# Patient Record
Sex: Female | Born: 1995 | Race: Black or African American | Hispanic: No | Marital: Single | Smoking: Never smoker
Health system: Southern US, Community
[De-identification: ages and names within clinical notes are randomized; demographics above are authoritative.]

## PROBLEM LIST (undated history)

## (undated) DIAGNOSIS — F419 Anxiety disorder, unspecified: Secondary | ICD-10-CM

## (undated) DIAGNOSIS — K219 Gastro-esophageal reflux disease without esophagitis: Secondary | ICD-10-CM

## (undated) DIAGNOSIS — Z789 Other specified health status: Secondary | ICD-10-CM

## (undated) DIAGNOSIS — N390 Urinary tract infection, site not specified: Secondary | ICD-10-CM

## (undated) DIAGNOSIS — D649 Anemia, unspecified: Secondary | ICD-10-CM

## (undated) DIAGNOSIS — Z5189 Encounter for other specified aftercare: Secondary | ICD-10-CM

## (undated) HISTORY — DX: Anemia, unspecified: D64.9

## (undated) HISTORY — DX: Encounter for other specified aftercare: Z51.89

## (undated) HISTORY — DX: Other specified health status: Z78.9

## (undated) HISTORY — PX: NO PAST SURGERIES: SHX2092

## (undated) HISTORY — PX: WISDOM TOOTH EXTRACTION: SHX21

## (undated) HISTORY — DX: Urinary tract infection, site not specified: N39.0

---
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
## 2013-12-31 ENCOUNTER — Emergency Department (INDEPENDENT_AMBULATORY_CARE_PROVIDER_SITE_OTHER)
Admission: EM | Admit: 2013-12-31 | Discharge: 2013-12-31 | Disposition: A | Discharge status: Home / Self Care | Payer: Medicaid Other | Source: Home / Self Care | Attending: Family Medicine | Admitting: Family Medicine

## 2013-12-31 ENCOUNTER — Encounter (HOSPITAL_COMMUNITY): Payer: Self-pay | Admitting: Emergency Medicine

## 2013-12-31 DIAGNOSIS — K089 Disorder of teeth and supporting structures, unspecified: Secondary | ICD-10-CM

## 2013-12-31 DIAGNOSIS — K0889 Other specified disorders of teeth and supporting structures: Secondary | ICD-10-CM

## 2013-12-31 MED ORDER — CLINDAMYCIN HCL 150 MG PO CAPS: 150.0000 mg | ORAL_CAPSULE | Freq: Four times a day (QID) | ORAL | Status: DC

## 2013-12-31 MED ORDER — DICLOFENAC POTASSIUM 50 MG PO TABS: 50.0000 mg | ORAL_TABLET | Freq: Three times a day (TID) | ORAL | Status: DC

## 2013-12-31 NOTE — Discharge Instructions (Signed)
Take medicine as prescribed, see your dentist as soon as possible °

## 2013-12-31 NOTE — ED Provider Notes (Signed)
CSN: [id number]     Arrival date & time 12/31/13  1611 History   First MD Initiated Contact with Patient 12/31/13 1710     No chief complaint on file.  (Consider location/radiation/quality/duration/timing/severity/associated sxs/prior Treatment) Patient is a 18 y.o. female presenting with tooth pain.  Dental Pain Location:  Lower Lower teeth location:  18/LL 2nd molar Quality:  Throbbing Severity:  Moderate Onset quality:  Gradual Duration:  3 days Progression:  Worsening Chronicity:  Recurrent Context: dental caries   Context comment:  [facility], given amox, pain relieved, stopped abx and did not go to dentist. Risk factors: lack of dental care     History reviewed. No pertinent past medical history. History reviewed. No pertinent past surgical history. No family history on file. History  Substance Use Topics  . Smoking status: Never Smoker   . Smokeless tobacco: Not on file  . Alcohol Use: No   OB History   Grav Para Term Preterm Abortions TAB SAB Ect Mult Living                 Review of Systems  Constitutional: Negative.   HENT: Positive for dental problem.     Allergies  Review of patient's allergies indicates no known allergies.  Home Medications   Current Outpatient Rx  Name  Route  Sig  Dispense  Refill  . clindamycin (CLEOCIN) 150 MG capsule   Oral   Take 1 capsule (150 mg total) by mouth 4 (four) times daily.   28 capsule   0   . diclofenac (CATAFLAM) 50 MG tablet   Oral   Take 1 tablet (50 mg total) by mouth 3 (three) times daily. For dental pain   15 tablet   0    BP 123/78  Pulse 65  Temp(Src) 99.3 F (37.4 C) (Oral)  Resp 16  SpO2 98%  LMP 12/24/2013 Physical Exam  Nursing note and vitals reviewed. Constitutional: She is oriented to person, place, and time. She appears well-developed and well-nourished. No distress.  HENT:  Mouth/Throat: Uvula is midline and oropharynx is clear and moist. Abnormal dentition. Dental  caries present.    Neck: Normal range of motion. Neck supple.  Lymphadenopathy:    She has cervical adenopathy.  Neurological: She is alert and oriented to person, place, and time.  Skin: Skin is warm and dry.    ED Course  Procedures (including critical care time) Labs Review Labs Reviewed - No data to display Imaging Review No results found.  EKG Interpretation    Date/Time:    Ventricular Rate:    PR Interval:    QRS Duration:   QT Interval:    QTC Calculation:   R Axis:     Text Interpretation:              MDM      Linna HoffJames D Rivkah Wolz, MD 12/31/13 [phone]

## 2013-12-31 NOTE — ED Notes (Signed)
Pt c/o dental pain onset 10/2013 --- reports [facility] and was given Amox that she did not finish Pain is constant; left bottom; occasional swelling She is alert w/no signs of acute distress.

## 2014-06-21 ENCOUNTER — Encounter (HOSPITAL_COMMUNITY): Payer: Self-pay | Admitting: Emergency Medicine

## 2014-06-21 ENCOUNTER — Emergency Department (INDEPENDENT_AMBULATORY_CARE_PROVIDER_SITE_OTHER)
Admission: EM | Admit: 2014-06-21 | Discharge: 2014-06-21 | Disposition: A | Discharge status: Home / Self Care | Payer: Medicaid Other | Source: Home / Self Care | Attending: Family Medicine | Admitting: Family Medicine

## 2014-06-21 DIAGNOSIS — M62838 Other muscle spasm: Secondary | ICD-10-CM | POA: Diagnosis not present

## 2014-06-21 DIAGNOSIS — F430 Acute stress reaction: Secondary | ICD-10-CM

## 2014-06-21 DIAGNOSIS — F438 Other reactions to severe stress: Secondary | ICD-10-CM

## 2014-06-21 DIAGNOSIS — F4389 Other reactions to severe stress: Secondary | ICD-10-CM | POA: Diagnosis not present

## 2014-06-21 MED ORDER — CYCLOBENZAPRINE HCL 10 MG PO TABS: 10.0000 mg | ORAL_TABLET | Freq: Two times a day (BID) | ORAL | Status: DC | PRN

## 2014-06-21 NOTE — ED Provider Notes (Signed)
CSN: [id number]     Arrival date & time 06/21/14  1526 History   First MD Initiated Contact with Patient 06/21/14 1649     Chief Complaint  Patient presents with  . Chest Pain   (Consider location/radiation/quality/duration/timing/severity/associated sxs/prior Treatment) HPI Comments: Jennifer Lynn presents today with a 3 day history of "neck spasms into her head" and periodic chest pain along the sternal region. She also reports high stress and anxiety with work and home. Would not elaborate, but denies harm to herself or others, and reports feels safe. Pain is sporadic, at times wakes her in the night. No palpitations, no SOB. See remainder of ROS.   The history is provided by the patient.    History reviewed. No pertinent past medical history. History reviewed. No pertinent past surgical history. No family history on file. History  Substance Use Topics  . Smoking status: Never Smoker   . Smokeless tobacco: Not on file  . Alcohol Use: No   OB History   Grav Para Term Preterm Abortions TAB SAB Ect Mult Living                 Review of Systems  All other systems reviewed and are negative.   Allergies  Review of patient's allergies indicates no known allergies.  Home Medications   Prior to Admission medications   Medication Sig Start Date End Date Taking? Authorizing Provider  aspirin-acetaminophen-caffeine (EXCEDRIN MIGRAINE) [phone] MG per tablet Take by mouth every 6 (six) hours as needed for headache.   Yes Historical Provider, MD  clindamycin (CLEOCIN) 150 MG capsule Take 1 capsule (150 mg total) by mouth 4 (four) times daily. 12/31/13   Linna HoffJames D Kindl, MD  cyclobenzaprine (FLEXERIL) 10 MG tablet Take 1 tablet (10 mg total) by mouth 2 (two) times daily as needed for muscle spasms. 06/21/14   Riki SheerMichelle G Ekaterini Capitano, PA-C  diclofenac (CATAFLAM) 50 MG tablet Take 1 tablet (50 mg total) by mouth 3 (three) times daily. For dental pain 12/31/13   Linna HoffJames D Kindl, MD   BP 146/94  Pulse 60   Temp(Src) 98.5 F (36.9 C) (Oral)  Resp 12  SpO2 100%  LMP 06/21/2014 Physical Exam  Nursing note and vitals reviewed. Constitutional: She is oriented to person, place, and time. She appears well-developed and well-nourished.  Tearful on exam  Eyes: Conjunctivae are normal. Pupils are equal, round, and reactive to light.  Neck: Normal range of motion.  Cardiovascular: Normal rate and regular rhythm.  Exam reveals no friction rub.   No murmur heard. Pulmonary/Chest: Effort normal and breath sounds normal. No respiratory distress. She has no wheezes. She has no rales. She exhibits no tenderness.  Musculoskeletal: Normal range of motion. She exhibits tenderness. She exhibits no edema.  Multiple tender points at left chest, generalized neck and upper scapular region  Lymphadenopathy:    She has no cervical adenopathy.  Neurological: She is alert and oriented to person, place, and time. She displays normal reflexes. She exhibits normal muscle tone.  Skin: Skin is warm and dry. She is not diaphoretic.  Psychiatric:  Behavior sad and tearful    ED Course  EKG  Date/Time: 06/21/2014 6:10 PM Performed by: Taiten Brawn G Authorized by: Riki SheerYOUNG, Adair Lemar G Interpreted by ED physician Comparison: not compared with previous ECG  Previous ECG: no previous ECG available Rhythm: sinus rhythm Rate: normal   (including critical care time) Labs Review Labs Reviewed - No data to display  Imaging Review No results found.  MDM   1. Muscle spasm   2. Stress disorder, acute    Most likely related to stress/anxiety in the setting of a possible muscle spasms. Normal EKG, and exam. Stressed importance of good health and stress management. Establish with PCP and f/u if worsens.     Riki Sheer, PA-C 06/21/14 [phone]

## 2014-06-21 NOTE — Discharge Instructions (Signed)
Muscle Cramps and Spasms Muscle cramps and spasms occur when a muscle or muscles tighten and you have no control over this tightening (involuntary muscle contraction). They are a common problem and can develop in any muscle. The most common place is in the calf muscles of the leg. Both muscle cramps and muscle spasms are involuntary muscle contractions, but they also have differences:   Muscle cramps are sporadic and painful. They may last a few seconds to a quarter of an hour. Muscle cramps are often more forceful and last longer than muscle spasms.  Muscle spasms may or may not be painful. They may also last just a few seconds or much longer. CAUSES  It is uncommon for cramps or spasms to be due to a serious underlying problem. In many cases, the cause of cramps or spasms is unknown. Some common causes are:   Overexertion.   Overuse from repetitive motions (doing the same thing over and over).   Remaining in a certain position for a long period of time.   Improper preparation, form, or technique while performing a sport or activity.   Dehydration.   Injury.   Side effects of some medicines.   Abnormally low levels of the salts and ions in your blood (electrolytes), especially potassium and calcium. This could happen if you are taking water pills (diuretics) or you are pregnant.  Some underlying medical problems can make it more likely to develop cramps or spasms. These include, but are not limited to:   Diabetes.   Parkinson disease.   Hormone disorders, such as thyroid problems.   Alcohol abuse.   Diseases specific to muscles, joints, and bones.   Blood vessel disease where not enough blood is getting to the muscles.  HOME CARE INSTRUCTIONS   Stay well hydrated. Drink enough water and fluids to keep your urine clear or pale yellow.  It may be helpful to massage, stretch, and relax the affected muscle.  For tight or tense muscles, use a warm towel, heating  pad, or hot shower water directed to the affected area.  If you are sore or have pain after a cramp or spasm, applying ice to the affected area may relieve discomfort.  Put ice in a plastic bag.  Place a towel between your skin and the bag.  Leave the ice on for 15-20 minutes, 03-04 times a day.  Medicines used to treat a known cause of cramps or spasms may help reduce their frequency or severity. Only take over-the-counter or prescription medicines as directed by your caregiver. SEEK MEDICAL CARE IF:  Your cramps or spasms get more severe, more frequent, or do not improve over time.  MAKE SURE YOU:   Understand these instructions.  Will watch your condition.  Will get help right away if you are not doing well or get worse. Document Released: 05/21/2002 Document Revised: 03/26/2013 Document Reviewed: 11/15/2012 [facility] Patient Information 2015 [place]. This information is not intended to replace advice given to you by your health care provider. Make sure you discuss any questions you have with your health care provider.  Stress Stress-related medical problems are becoming increasingly common. The body has a built-in physical response to stressful situations. Faced with pressure, challenge or danger, we need to react quickly. Our bodies release hormones such as cortisol and adrenaline to help do this. These hormones are part of the "fight or flight" response and affect the metabolic rate, heart rate and blood pressure, resulting in a heightened, stressed state that  prepares the body for optimum performance in dealing with a stressful situation. It is likely that early man required these mechanisms to stay alive, but usually modern stresses do not call for this, and the same hormones released in today's world can damage health and reduce coping ability. CAUSES  Pressure to perform at work, at school or in sports.  Threats of physical violence.  Money  worries.  Arguments.  Family conflicts.  Divorce or separation from significant other.  Bereavement.  New job or unemployment.  Changes in location.  Alcohol or drug abuse. SOMETIMES, THERE IS NO PARTICULAR REASON FOR DEVELOPING STRESS. Almost all people are at risk of being stressed at some time in their lives. It is important to know that some stress is temporary and some is long term.  Temporary stress will go away when a situation is resolved. Most people can cope with short periods of stress, and it can often be relieved by relaxing, taking a walk or getting any type of exercise, chatting through issues with friends, or having a good night's sleep.  Chronic (long-term, continuous) stress is much harder to deal with. It can be psychologically and emotionally damaging. It can be harmful both for an individual and for friends and family. SYMPTOMS Everyone reacts to stress differently. There are some common effects that help [place] recognize it. In times of extreme stress, people may:  Shake uncontrollably.  Breathe faster and deeper than normal (hyperventilate).  Vomit.  For people with asthma, stress can trigger an attack.  For some people, stress may trigger migraine headaches, ulcers, and body pain. PHYSICAL EFFECTS OF STRESS MAY INCLUDE:  Loss of energy.  Skin problems.  Aches and pains resulting from tense muscles, including neck ache, backache and tension headaches.  Increased pain from arthritis and other conditions.  Irregular heart beat (palpitations).  Periods of irritability or anger.  Apathy or depression.  Anxiety (feeling uptight or worrying).  Unusual behavior.  Loss of appetite.  Comfort eating.  Lack of concentration.  Loss of, or decreased, sex-drive.  Increased smoking, drinking, or recreational drug use.  For women, missed periods.  Ulcers, joint pain, and muscle pain. Post-traumatic stress is the stress caused by any serious  accident, strong emotional damage, or extremely difficult or violent experience such as rape or war. Post-traumatic stress victims can experience mixtures of emotions such as fear, shame, depression, guilt or anger. It may include recurrent memories or images that may be haunting. These feelings can last for weeks, months or even years after the traumatic event that triggered them. Specialized treatment, possibly with medicines and psychological therapies, is available. If stress is causing physical symptoms, severe distress or making it difficult for you to function as normal, it is worth seeing your caregiver. It is important to remember that although stress is a usual part of life, extreme or prolonged stress can lead to other illnesses that will need treatment. It is better to visit a doctor sooner rather than later. Stress has been linked to the development of high blood pressure and heart disease, as well as insomnia and depression. There is no diagnostic test for stress since everyone reacts to it differently. But a caregiver will be able to spot the physical symptoms, such as:  Headaches.  Shingles.  Ulcers. Emotional distress such as intense worry, low mood or irritability should be detected when the doctor asks pertinent questions to identify any underlying problems that might be the cause. In case there are physical reasons for  the symptoms, the doctor may also want to do some tests to exclude certain conditions. If you feel that you are suffering from stress, try to identify the aspects of your life that are causing it. Sometimes you may not be able to change or avoid them, but even a small change can have a positive ripple effect. A simple lifestyle change can make all the difference. STRATEGIES THAT CAN HELP DEAL WITH STRESS:  Delegating or sharing responsibilities.  Avoiding confrontations.  Learning to be more assertive.  Regular exercise.  Avoid using alcohol or street drugs to  cope.  Eating a healthy, balanced diet, rich in fruit and vegetables and proteins.  Finding humor or absurdity in stressful situations.  Never taking on more than you know you can handle comfortably.  Organizing your time better to get as much done as possible.  Talking to friends or family and sharing your thoughts and fears.  Listening to music or relaxation tapes.  Relaxation techniques like deep breathing, meditation, and yoga.  Tensing and then relaxing your muscles, starting at the toes and working up to the head and neck. If you think that you would benefit from help, either in identifying the things that are causing your stress or in learning techniques to help you relax, see a caregiver who is capable of helping you with this. Rather than relying on medications, it is usually better to try and identify the things in your life that are causing stress and try to deal with them. There are many techniques of managing stress including counseling, psychotherapy, aromatherapy, yoga, and exercise. Your caregiver can help you determine what is best for you. Document Released: 02/19/2003 Document Revised: 12/04/2013 Document Reviewed: 01/16/2008 [facility] Patient Information 2015 [place], [place]. This information is not intended to replace advice given to you by your health care provider. Make sure you discuss any questions you have with your health care provide   Treat symptomatically with muscle relaxers as needed. Please get in with a PCP to get established and assist with stress and anxiety management.

## 2014-06-21 NOTE — ED Notes (Signed)
Points to center chest.  C/o headache, nausea, no vomiting.  When asked about sob responded " kinda, yeah".  Denies fever, denies cough, denies runny nose.

## 2014-06-22 NOTE — ED Provider Notes (Signed)
Medical screening examination/treatment/procedure(s) were performed by non-physician practitioner and as supervising physician I was immediately available for consultation/collaboration.  Bradin Mcadory, M.D.  Aeneas Longsworth C Roylee Chaffin, MD 06/22/14 0858 

## 2014-07-15 ENCOUNTER — Emergency Department (INDEPENDENT_AMBULATORY_CARE_PROVIDER_SITE_OTHER)
Admission: EM | Admit: 2014-07-15 | Discharge: 2014-07-15 | Disposition: A | Discharge status: Home / Self Care | Payer: Medicaid Other | Source: Home / Self Care | Attending: Emergency Medicine | Admitting: Emergency Medicine

## 2014-07-15 ENCOUNTER — Encounter (HOSPITAL_COMMUNITY): Payer: Self-pay | Admitting: Emergency Medicine

## 2014-07-15 DIAGNOSIS — H9202 Otalgia, left ear: Secondary | ICD-10-CM

## 2014-07-15 DIAGNOSIS — H9209 Otalgia, unspecified ear: Secondary | ICD-10-CM

## 2014-07-15 MED ORDER — IBUPROFEN 800 MG PO TABS
ORAL_TABLET | ORAL | Status: AC
  Filled 2014-07-15: qty 1

## 2014-07-15 MED ORDER — IBUPROFEN 800 MG PO TABS
800.0000 mg | ORAL_TABLET | Freq: Once | ORAL | Status: AC
  Administered 2014-07-15: 800 mg via ORAL

## 2014-07-15 NOTE — ED Provider Notes (Signed)
CSN: [id number]     Arrival date & time 07/15/14  1101 History   First MD Initiated Contact with Patient 07/15/14 1206     Chief Complaint  Patient presents with  . 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 female presenting with ear pain. The history is provided by the patient.  Otalgia Location:  Left Behind ear:  Redness and swelling Quality:  Sharp, shooting and throbbing Severity:  Severe Onset quality:  Gradual Duration:  24 hours Timing:  Constant Progression:  Worsening Chronicity:  New Context: not direct blow, not elevation change, not foreign body in ear, not loud noise and no water in ear   Relieved by:  Nothing Worsened by:  Palpation Ineffective treatments:  OTC medications Associated symptoms: fever   Associated symptoms: no abdominal pain, no congestion, no cough, no diarrhea, no ear discharge, no headaches, no hearing loss, no neck pain, no rash, no rhinorrhea, no sore throat, no tinnitus and no vomiting   Associated symptoms comment:  +chills   History reviewed. No pertinent past medical history. History reviewed. No pertinent past surgical history. History reviewed. No pertinent family history. History  Substance Use Topics  . Smoking status: Never Smoker   . Smokeless tobacco: Not on file  . Alcohol Use: No   OB History   Grav Para Term Preterm Abortions TAB SAB Ect Mult Living                 Review of Systems  Constitutional: Positive for fever.  HENT: Positive for ear pain. Negative for congestion, ear discharge, hearing loss, rhinorrhea, sore throat and tinnitus.   Respiratory: Negative for cough.   Gastrointestinal: Negative for vomiting, abdominal pain and diarrhea.  Musculoskeletal: Negative for neck pain.  Skin: Negative for rash.   Neurological: Negative for headaches.  All other systems reviewed and are negative.   Allergies  Review of patient's allergies indicates no known allergies.  Home Medications   Prior to Admission medications   Medication Sig Start Date End Date Taking? Authorizing Provider  aspirin-acetaminophen-caffeine (EXCEDRIN MIGRAINE) [phone] MG per tablet Take by mouth every 6 (six) hours as needed for headache.   Yes Historical Provider, MD  clindamycin (CLEOCIN) 150 MG capsule Take 1 capsule (150 mg total) by mouth 4 (four) times daily. 12/31/13   Linna Hoff, MD  cyclobenzaprine (FLEXERIL) 10 MG tablet Take 1 tablet (10 mg total) by mouth 2 (two) times daily as needed for muscle spasms. 06/21/14   Riki Sheer, PA-C  diclofenac (CATAFLAM) 50 MG tablet Take 1 tablet (50 mg total) by mouth 3 (three) times daily. For dental pain 12/31/13   Linna Hoff, MD   BP 131/91  Pulse 60  Temp(Src) 99.4 F (37.4 C) (Oral)  Resp 16  Ht 5\' 4"  (1.626 m)  Wt 230 lb (104.327 kg)  BMI 39.46 kg/m2  SpO2 98%  LMP 07/13/2014 Physical Exam  Nursing note and vitals reviewed. Constitutional: She is oriented to person, place, and time. She appears well-developed and well-nourished. No distress.  HENT:  Head: Normocephalic and atraumatic.  Right Ear: Hearing, external ear and ear canal normal. No drainage, swelling or tenderness. No foreign bodies. No mastoid tenderness. Tympanic membrane is not injected, not perforated, not erythematous and not retracted. A middle ear effusion is present. No hemotympanum. No decreased hearing is noted.  Left Ear: Hearing and ear canal normal. No lacerations. There is swelling and tenderness. No drainage. No foreign bodies. There is mastoid tenderness. Tympanic membrane is not injected, not perforated, not erythematous and not retracted. A middle ear effusion is present. No hemotympanum. No decreased hearing is noted.  Trace of clear fluid behind right & left TMs  1 cm x 2  cm oval shaped area of erythema, STS and tenderness over left mastoid process. Increased pain with palpation and movement of left external ear  Eyes: Conjunctivae are normal. No scleral icterus.  Neck: Normal range of motion. Neck supple.  Cardiovascular: Normal rate, regular rhythm and normal heart sounds.   Pulmonary/Chest: Effort normal and breath sounds normal. No respiratory distress. She has no wheezes.  Musculoskeletal: Normal range of motion.  Lymphadenopathy:    She has no cervical adenopathy.  Neurological: She is alert and oriented to person, place, and time.  Skin: Skin is warm and dry. There is erythema.  See ENT exam  Psychiatric: She has a normal mood and affect. Her behavior is normal.    ED Course  Procedures (including critical care time) Labs Review Labs Reviewed - No data to display  Imaging Review No results found.   MDM   1. Acute ear pain, left    Ddx includes subcutaneous abscess, infected posterior auricular lymph node, or early left mastoiditis. [facility] provider will see patient at office at 1:40pm today. Patient given 800mg  dose of ibuprofen for pain while in clinic and provided instructions for ENT follow up at 1:40pm today.    Jess BartersJennifer Lee Elbow LakePresson, GeorgiaPA 07/15/14 1258

## 2014-07-15 NOTE — ED Notes (Signed)
C/o 2nd day duration of pain in her left ear. C/o sounds are muffled, and loud sounds make pain worse . Denies dizziness, injury or drainage from her ear. States one dose of excedrin yesterday has not made any difference in her pain . C/o feels as if the side of her head on left is swollen

## 2014-07-15 NOTE — Discharge Instructions (Signed)
I have contacted [facility] regarding your condition and they would like to see you in their office today at 1:40pm for further evaluation. Please go directly from our clinic to their office to be seen.  Otalgia The most common reason for this in children is an infection of the middle ear. Pain from the middle ear is usually caused by a build-up of fluid and pressure behind the eardrum. Pain from an earache can be sharp, dull, or burning. The pain may be temporary or constant. The middle ear is connected to the nasal passages by a short narrow tube called the Eustachian tube. The Eustachian tube allows fluid to drain out of the middle ear, and helps keep the pressure in your ear equalized. CAUSES  A cold or allergy can block the Eustachian tube with inflammation and the build-up of secretions. This is especially likely in small children, because their Eustachian tube is shorter and more horizontal. When the Eustachian tube closes, the normal flow of fluid from the middle ear is stopped. Fluid can accumulate and cause stuffiness, pain, hearing loss, and an ear infection if germs start growing in this area. SYMPTOMS  The symptoms of an ear infection may include fever, ear pain, fussiness, increased crying, and irritability. Many children will have temporary and minor hearing loss during and right after an ear infection. Permanent hearing loss is rare, but the risk increases the more infections a child has. Other causes of ear pain include retained water in the outer ear canal from swimming and bathing. Ear pain in adults is less likely to be from an ear infection. Ear pain may be referred from other locations. Referred pain may be from the joint between your jaw and the skull. It may also come from a tooth problem or problems in the neck. Other causes of ear pain include:  A foreign body in the ear.  Outer ear infection.  Sinus infections.  Impacted ear wax.  Ear  injury.  Arthritis of the jaw or TMJ problems.  Middle ear infection.  Tooth infections.  Sore throat with pain to the ears. DIAGNOSIS  Your caregiver can usually make the diagnosis by examining you. Sometimes other special studies, including x-rays and lab work may be necessary. TREATMENT   If antibiotics were prescribed, use them as directed and finish them even if you or your child's symptoms seem to be improved.  Sometimes PE tubes are needed in children. These are little plastic tubes which are put into the eardrum during a simple surgical procedure. They allow fluid to drain easier and allow the pressure in the middle ear to equalize. This helps relieve the ear pain caused by pressure changes. HOME CARE INSTRUCTIONS   Only take over-the-counter or prescription medicines for pain, discomfort, or fever as directed by your caregiver. DO NOT GIVE CHILDREN ASPIRIN because of the association of Reye's Syndrome in children taking aspirin.  Use a cold pack applied to the outer ear for 15-20 minutes, 03-04 times per day or as needed may reduce pain. Do not apply ice directly to the skin. You may cause frost bite.  Over-the-counter ear drops used as directed may be effective. Your caregiver may sometimes prescribe ear drops.  Resting in an upright position may help reduce pressure in the middle ear and relieve pain.  Ear pain caused by rapidly descending from high altitudes can be relieved by swallowing or chewing gum. Allowing infants to suck on a bottle during airplane travel can help.  Do not smoke in the house or near children. If you are unable to quit smoking, smoke outside.  Control allergies. SEEK IMMEDIATE MEDICAL CARE IF:   You or your child are becoming sicker.  Pain or fever relief is not obtained with medicine.  You or your child's symptoms (pain, fever, or irritability) do not improve within 24 to 48 hours or as instructed.  Severe pain suddenly stops hurting. This  may indicate a ruptured eardrum.  You or your children develop new problems such as severe headaches, stiff neck, difficulty swallowing, or swelling of the face or around the ear. Document Released: 07/16/2004 Document Revised: 02/21/2012 Document Reviewed: 11/20/2008 [facility] Patient Information 2015 [place], [place]. This information is not intended to replace advice given to you by your health care provider. Make sure you discuss any questions you have with your health care provider.

## 2014-07-17 NOTE — ED Provider Notes (Signed)
Medical screening examination/treatment/procedure(s) were performed by non-physician practitioner and as supervising physician I was immediately available for consultation/collaboration.  Jaidee Stipe, M.D.  Daemyn Gariepy C Merridith Dershem, MD 07/17/14 2209 

## 2014-07-29 ENCOUNTER — Ambulatory Visit: Payer: Medicaid Other | Admitting: Internal Medicine

## 2014-08-03 ENCOUNTER — Encounter (HOSPITAL_COMMUNITY): Payer: Self-pay | Admitting: Emergency Medicine

## 2014-08-03 ENCOUNTER — Emergency Department (INDEPENDENT_AMBULATORY_CARE_PROVIDER_SITE_OTHER)
Admission: EM | Admit: 2014-08-03 | Discharge: 2014-08-03 | Disposition: A | Discharge status: Home / Self Care | Payer: Medicaid Other | Source: Home / Self Care | Attending: Family Medicine | Admitting: Family Medicine

## 2014-08-03 DIAGNOSIS — M5380 Other specified dorsopathies, site unspecified: Secondary | ICD-10-CM

## 2014-08-03 DIAGNOSIS — M62830 Muscle spasm of back: Secondary | ICD-10-CM

## 2014-08-03 MED ORDER — KETOROLAC TROMETHAMINE 60 MG/2ML IM SOLN
INTRAMUSCULAR | Status: AC
  Filled 2014-08-03: qty 2

## 2014-08-03 MED ORDER — KETOROLAC TROMETHAMINE 60 MG/2ML IM SOLN
60.0000 mg | Freq: Once | INTRAMUSCULAR | Status: AC
  Administered 2014-08-03: 60 mg via INTRAMUSCULAR

## 2014-08-03 NOTE — ED Notes (Signed)
2 days of low back, anterior lower legs, and bilateral foot pain.  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  She tried aspirin and a muscle relaxant without much relief

## 2014-08-03 NOTE — Discharge Instructions (Signed)
Your back pain is likely from spasm of the mid back muscles This will likely take some time to resolve. Please try to stay active and stretch adn massage the affected area.  In 24 hours please start ibuprofen 600mg  or 800mg  every 6 to 8 hours.  Please come back if you are not better Please also try losing weight as this will help    Mid-Back Strain with Rehab  A strain is an injury in which a tendon or muscle is torn. The muscles and tendons of the mid-back are vulnerable to strains. However, these muscles and tendons are very strong and require a great force to be injured. The muscles of the mid-back are responsible for stabilizing the spinal column, as well as spinal twisting (rotation). Strains are classified into three categories. Grade 1 strains cause pain, but the tendon is not lengthened. Grade 2 strains include a lengthened ligament, due to the ligament being stretched or partially ruptured. With grade 2 strains there is still function, although the function may be decreased. Grade 3 strains involve a complete tear of the tendon or muscle, and function is usually impaired. SYMPTOMS   Pain in the middle of the back.  Pain that may affect only one side, and is worse with movement.  Muscle spasms, and often swelling in the back.  Loss of strength of the back muscles.  Crackling sound (crepitation) when the muscles are touched. CAUSES  Mid-back strains occur when a force is placed on the muscles or tendons that is greater than they can handle. Common causes of injury include:  Ongoing overuse of the muscle-tendon units in the middle back, usually from incorrect body posture.  A single violent injury or force applied to the back. RISK INCREASES WITH:  Sports that involve twisting forces on the spine or a lot of bending at the waist (football, rugby, weightlifting, bowling, golf, tennis, speed skating, racquetball, swimming, running, gymnastics, diving).  Poor strength and  flexibility.  Failure to warm up properly before activity.  Family history of low back pain or disk disorders.  Previous back injury or surgery (especially fusion). PREVENTION  Learn and use proper sports technique.  Warm up and stretch properly before activity.  Allow for adequate recovery between workouts.  Maintain physical fitness:  Strength, flexibility, and endurance.  Cardiovascular fitness. PROGNOSIS  If treated properly, mid-back strains usually heal within 6 weeks. RELATED COMPLICATIONS   Frequently recurring symptoms, resulting in a chronic problem. Properly treating the problem the first time decreases frequency of recurrence.  Chronic inflammation, scarring, and partial muscle-tendon tear.  Delayed healing or resolution of symptoms, especially if activity is resumed too soon.  Prolonged disability. TREATMENT Treatment first involves the use of ice and medicine, to reduce pain and inflammation. As the pain begins to subside, you may begin strengthening and stretching exercises to improve body posture and sport technique. These exercises may be performed at home or with a therapist. Severe injuries may require referral to a therapist for further evaluation and treatment, such as ultrasound. Corticosteroid injections may be given to help reduce inflammation. Biofeedback (watching monitors of your body processes) and psychotherapy may also be prescribed. Prolonged bed rest is felt to do more harm than good. Massage may help break the muscle spasms. Sometimes, an injection of cortisone, with or without local anesthetics, may be given to help relieve the pain and spasms. MEDICATION   If pain medicine is needed, nonsteroidal anti-inflammatory medicines (aspirin and ibuprofen), or other minor pain relievers (acetaminophen),  are often advised.  Do not take pain medicine for 7 days before surgery.  Prescription pain relievers may be given, if your caregiver thinks they are  needed. Use only as directed and only as much as you need.  Ointments applied to the skin may be helpful.  Corticosteroid injections may be given by your caregiver. These injections should be reserved for the most serious cases, because they may only be given a certain number of times. HEAT AND COLD:   Cold treatment (icing) should be applied for 10 to 15 minutes every 2 to 3 hours for inflammation and pain, and immediately after activity that aggravates your symptoms. Use ice packs or an ice massage.  Heat treatment may be used before performing stretching and strengthening activities prescribed by your caregiver, physical therapist, or athletic trainer. Use a heat pack or a warm water soak. SEEK IMMEDIATE MEDICAL CARE IF:  Symptoms get worse or do not improve in 2 to 4 weeks, despite treatment.  You develop numbness, weakness, or loss of bowel or bladder function.  New, unexplained symptoms develop. (Drugs used in treatment may produce side effects.) EXERCISES RANGE OF MOTION (ROM) AND STRETCHING EXERCISES - Mid-Back Strain These exercises may help you when beginning to rehabilitate your injury. In order to successfully resolve your symptoms, you must improve your posture. These exercises are designed to help reduce the forward-head and rounded-shoulder posture which contributes to this condition. Your symptoms may resolve with or without further involvement from your physician, physical therapist or athletic trainer. While completing these exercises, remember:   Restoring tissue flexibility helps normal motion to return to the joints. This allows healthier, less painful movement and activity.  An effective stretch should be held for at least 30 seconds.  A stretch should never be painful. You should only feel a gentle lengthening or release in the stretched tissue. STRETCH - Axial Extension  Stand or sit on a firm surface. Assume a good posture: chest up, shoulders drawn back, stomach  muscles slightly tense, knees unlocked (if standing) and feet hip width apart.  Slowly retract your chin, so your head slides back and your chin slightly lowers. Continue to look straight ahead.  You should feel a gentle stretch in the back of your head. Be certain not to feel an aggressive stretch since this can cause headaches later.  Hold for __________ seconds. Repeat __________ times. Complete this exercise __________ times per day. RANGE OF MOTION- Upper Thoracic Extension  Sit on a firm chair with a high back. Assume a good posture: chest up, shoulders drawn back, abdominal muscles slightly tense, and feet hip width apart. Place a small pillow or folded towel in the curve of your lower back, if you are having difficulty maintaining good posture.  Gently brace your neck with your hands, allowing your arms to rest on your chest.  Continue to support your neck and slowly extend your back over the chair. You will feel a stretch across your upper back.  Hold __________ seconds. Slowly return to the starting position. Repeat __________ times. Complete this exercise __________ times per day. RANGE OF MOTION- Mid-Thoracic Extension  Roll a towel so that it is about 4 inches in diameter.  Position the towel lengthwise. Lay on the towel so that your spine, but not your shoulder blades, are supported.  You should feel your mid-back arching toward the floor. To increase the stretch, extend your arms away from your body.  Hold for __________ seconds. Repeat exercise __________ times,  __________ times per day. STRENGTHENING EXERCISES - Mid-Back Strain These exercises may help you when beginning to rehabilitate your injury. They may resolve your symptoms with or without further involvement from your physician, physical therapist or athletic trainer. While completing these exercises, remember:   Muscles can gain both the endurance and the strength needed for everyday activities through  controlled exercises.  Complete these exercises as instructed by your physician, physical therapist or athletic trainer. Increase the resistance and repetitions only as guided by your caregiver.  You may experience muscle soreness or fatigue, but the pain or discomfort you are trying to eliminate should never worsen during these exercises. If this pain does worsen, stop and make certain you are following the directions exactly. If the pain is still present after adjustments, discontinue the exercise until you can discuss the trouble with your caregiver. STRENGTHENING - Quadruped, Opposite UE/LE Lift  Assume a hands and knees position on a firm surface. Keep your hands under your shoulders and your knees under your hips. You may place padding under your knees for comfort.  Find your neutral spine and gently tense your abdominal muscles so that you can maintain this position. Your shoulders and hips should form a rectangle that is parallel with the floor and is not twisted.  Keeping your trunk steady, lift your right hand no higher than your shoulder and then your left leg no higher than your hip. Make sure you are not holding your breath. Hold this position __________ seconds.  Continuing to keep your abdominal muscles tense and your back steady, slowly return to your starting position. Repeat with the opposite arm and leg. Repeat __________ times. Complete this exercise __________ times per day.  STRENGTH - Shoulder Extensors  Secure a rubber exercise band or tubing to a fixed object (table, pole) so that it is at the height of your shoulders when you are either standing, or sitting on a firm armless chair.  With a thumbs-up grip, grasp an end of the band in each hand. Straighten your elbows and lift your hands straight in front of you at shoulder height. Step back away from the secured end of band, until it becomes tense.  Squeezing your shoulder blades together, pull your hands down to the  sides of your thighs. Do not allow your hands to go behind you.  Hold for __________ seconds. Slowly ease the tension on the band, as you reverse the directions and return to the starting position. Repeat __________ times. Complete this exercise __________ times per day.  STRENGTH - Horizontal Abductors Choose one of the two positions to complete this exercise. Prone: lying on stomach:  Lie on your stomach on a firm surface so that your right / left arm overhangs the edge. Rest your forehead on your opposite forearm. With your palm facing the floor and your elbow straight, hold a __________ weight in your hand.  Squeeze your right / left shoulder blade to your mid-back spine and then slowly raise your arm to the height of the bed.  Hold for __________ seconds. Slowly reverse the directions and return to the starting position, controlling the weight as you lower your arm. Repeat __________ times. Complete this exercise __________ times per day. Standing:   Secure a rubber exercise band or tubing, so that it is at the height of your shoulders when you are either standing, or sitting on a firm armless chair.  Grasp an end of the band in each hand and have your palms face  each other. Straighten your elbows and lift your hands straight in front of you at shoulder height. Step back away from the secured end of band, until it becomes tense.  Squeeze your shoulder blades together. Keeping your elbows locked and your hands at shoulder height, spread your arms apart, forming a "T" shape with your body. Hold __________ seconds. Slowly ease the tension on the band, as you reverse the directions and return to the starting position. Repeat __________ times. Complete this exercise __________ times per day. STRENGTH - Scapular Retractors and External Rotators, Rowing  Secure a rubber exercise band or tubing, so that it is at the height of your shoulders when you are either standing, or sitting on a firm  armless chair.  With a palm-down grip, grasp an end of the band in each hand. Straighten your elbows and lift your hands straight in front of you at shoulder height. Step back away from the secured end of band, until it becomes tense.  Step 1: Squeeze your shoulder blades together. Bending your elbows, draw your hands to your chest as if you are rowing a boat. At the end of this motion, your hands and elbow should be at shoulder height and your elbows should be out to your sides.  Step 2: Rotate your shoulder to raise your hands above your head. Your forearms should be vertical and your upper arms should be horizontal.  Hold for __________ seconds. Slowly ease the tension on the band, as you reverse the directions and return to the starting position. Repeat __________ times. Complete this exercise __________ times per day.  POSTURE AND BODY MECHANICS CONSIDERATIONS - Mid-Back Strain Keeping correct posture when sitting, standing or completing your activities will reduce the stress put on different body tissues, allowing injured tissues a chance to heal and limiting painful experiences. The following are general guidelines for improved posture. Your physician or physical therapist will provide you with any instructions specific to your needs. While reading these guidelines, remember:  The exercises prescribed by your provider will help you have the flexibility and strength to maintain correct postures.  The correct posture provides the best environment for your joints to work. All of your joints have less wear and tear when properly supported by a spine with good posture. This means you will experience a healthier, less painful body.  Correct posture must be practiced with all of your activities, especially prolonged sitting and standing. Correct posture is as important when doing repetitive low-stress activities (typing) as it is when doing a single heavy-load activity (lifting). PROPER SITTING  POSTURE In order to minimize stress and discomfort on your spine, you must sit with correct posture. Sitting with good posture should be effortless for a healthy body. Returning to good posture is a gradual process. Many people can work toward this most comfortably by using various supports until they have the flexibility and strength to maintain this posture on their own. When sitting with proper posture, your ears will fall over your shoulders and your shoulders will fall over your hips. You should use the back of the chair to support your upper back. Your lower back will be in a neutral position, just slightly arched. You may place a small pillow or folded towel at the base of your low back for  support.  When working at a desk, create an environment that supports good, upright posture. Without extra support, muscles fatigue and lead to excessive strain on joints and other tissues. Keep these recommendations  in mind: CHAIR:  A chair should be able to slide under your desk when your back makes contact with the back of the chair. This allows you to work closely.  The chair's height should allow your eyes to be level with the upper part of your monitor and your hands to be slightly lower than your elbows. BODY POSITION  Your feet should make contact with the floor. If this is not possible, use a foot rest.  Keep your ears over your shoulders. This will reduce stress on your neck and lower back. INCORRECT SITTING POSTURES If you are feeling tired and unable to assume a healthy sitting posture, do not slouch or slump. This puts excessive strain on your back tissues, causing more damage and pain. Healthier options include:  Using more support, like a lumbar pillow.  Switching tasks to something that requires you to be upright or walking.  Talking a brief walk.  Lying down to rest in a neutral-spine position. CORRECT STANDING POSTURES Proper standing posture should be assumed with all daily  activities, even if they only take a few moments, like when brushing your teeth. As in sitting, your ears should fall over your shoulders and your shoulders should fall over your hips. You should keep a slight tension in your abdominal muscles to brace your spine. Your tailbone should point down to the ground, not behind your body, resulting in an over-extended swayback posture.  INCORRECT STANDING POSTURES Common incorrect standing postures include a forward head, locked knees, and an excessive swayback. WALKING Walk with an upright posture. Your ears, shoulders and hips should all line-up. CORRECT LIFTING TECHNIQUES DO :   Assume a wide stance. This will provide you more stability and the opportunity to get as close as possible to the object which you are lifting.  Tense your abdominals to brace your spine. Bend at the knees and hips. Keeping your back locked in a neutral-spine position, lift using your leg muscles. Lift with your legs, keeping your back straight.  Test the weight of unknown objects before attempting to lift them.  Try to keep your elbows locked down at your sides in order get the best strength from your shoulders when carrying an object.  Always ask for help when lifting heavy or awkward objects. INCORRECT LIFTING TECHNIQUES DO NOT:   Lock your knees when lifting, even if it is a small object.  Bend and twist. Pivot at your feet or move your feet when needing to change directions.  Assume that you can safely pick up even a paperclip without proper posture. Document Released: 11/29/2005 Document Revised: 04/15/2014 Document Reviewed: 03/13/2009 [facility] Patient Information 2015 [place], [place]. This information is not intended to replace advice given to you by your health care provider. Make sure you discuss any questions you have with your health care provider.

## 2014-08-03 NOTE — ED Provider Notes (Signed)
CSN: [id number]     Arrival date & time 08/03/14  1000 History   First MD Initiated Contact with Patient 08/03/14 1026     Chief Complaint  Patient presents with  . Back Pain   (Consider location/radiation/quality/duration/timing/severity/associated sxs/prior Treatment) HPI  Back pain: started Wed night. Occurred when getting out of car after work. Radiates from lumbar spine to LE bilat. Worse the following morning. Sharp pain. Rested Thu. Tried muscle relaxers on Friday w/o relief. Went to work this am. 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 Not currently hurting but hurts w/ certain movements. No change recently in exercise or work routine.    History reviewed. No pertinent past medical history. History reviewed. No pertinent past surgical history. No family history on file. History  Substance Use Topics  . Smoking status: Never Smoker   . Smokeless tobacco: Not on file  . Alcohol Use: No   OB History   Grav Para Term Preterm Abortions TAB SAB Ect Mult Living                 Review of Systems  Allergies  Review of patient's allergies indicates no known allergies.  Home Medications   Prior to Admission medications   Medication Sig Start Date End Date Taking? Authorizing Provider  cyclobenzaprine (FLEXERIL) 10 MG tablet Take 1 tablet (10 mg total) by mouth 2 (two) times daily as needed for muscle spasms. 06/21/14  Yes Riki Sheer, PA-C  aspirin-acetaminophen-caffeine (EXCEDRIN MIGRAINE) [phone] MG per tablet Take by mouth every 6 (six) hours as needed for headache.    Historical Provider, MD  diclofenac (CATAFLAM) 50 MG tablet Take 1 tablet (50 mg total) by mouth 3 (three) times daily. For dental pain 12/31/13   Linna Hoff, MD   BP 122/64  Pulse 61  Temp(Src) 99.7 F (37.6 C) (Oral)  Resp 20  SpO2 99%  LMP 07/15/2014 Physical Exam  Constitutional: She appears well-developed and well-nourished. No distress.  HENT:  Head: Normocephalic and atraumatic.  Eyes:  EOM are normal. Pupils are equal, round, and reactive to light.  Neck: Normal range of motion. Neck supple.  Cardiovascular: Normal rate and normal heart sounds.   No murmur heard. Pulmonary/Chest: Effort normal and breath sounds normal.  Abdominal: Soft. She exhibits no distension.  Musculoskeletal:  R thoracic perispinal muscle tenderness and ttp that reproduces pain for pt on palpation. Straight leg raise and FABERs minimally positive but then pt able to move w/o pain when moving after commands and nonttp.   Skin: Skin is warm. No rash noted. She is not diaphoretic. No erythema. No pallor.  Psychiatric: She has a normal mood and affect. Her behavior is normal. Judgment and thought content normal.    ED Course  Procedures (including critical care time) Labs Review Labs Reviewed - No data to display  Imaging Review No results found.   MDM   1. Back spasm    Physical exam and history do not completely coincide adn pts physical exam not consistent.  Symptoms most consistent w/ R mid back spasm and strian toradol 60 in office.  Discussed losing wt, exercises (handout given, massage, heat Start ibuprofen 600-800 in 24 hrs Precautions given and all questions answered  Shelly Flatten, MD Family Medicine 08/03/2014, 11:11 AM      Ozella Rocks, MD 08/03/14 [phone]

## 2014-09-13 ENCOUNTER — Encounter (HOSPITAL_COMMUNITY): Payer: Self-pay | Admitting: Emergency Medicine

## 2014-09-13 ENCOUNTER — Emergency Department (HOSPITAL_COMMUNITY)
Admission: EM | Admit: 2014-09-13 | Discharge: 2014-09-13 | Disposition: A | Discharge status: Home / Self Care | Payer: Medicaid Other | Attending: Emergency Medicine | Admitting: Emergency Medicine

## 2014-09-13 DIAGNOSIS — S29019A Strain of muscle and tendon of unspecified wall of thorax, initial encounter: Secondary | ICD-10-CM | POA: Insufficient documentation

## 2014-09-13 DIAGNOSIS — Y9389 Activity, other specified: Secondary | ICD-10-CM | POA: Diagnosis not present

## 2014-09-13 DIAGNOSIS — Y92410 Unspecified street and highway as the place of occurrence of the external cause: Secondary | ICD-10-CM | POA: Insufficient documentation

## 2014-09-13 DIAGNOSIS — S161XXA Strain of muscle, fascia and tendon at neck level, initial encounter: Secondary | ICD-10-CM | POA: Diagnosis not present

## 2014-09-13 DIAGNOSIS — S199XXA Unspecified injury of neck, initial encounter: Secondary | ICD-10-CM | POA: Diagnosis present

## 2014-09-13 DIAGNOSIS — T148XXA Other injury of unspecified body region, initial encounter: Secondary | ICD-10-CM

## 2014-09-13 DIAGNOSIS — Z791 Long term (current) use of non-steroidal anti-inflammatories (NSAID): Secondary | ICD-10-CM | POA: Diagnosis not present

## 2014-09-13 MED ORDER — NAPROXEN 500 MG PO TABS: 500.0000 mg | ORAL_TABLET | Freq: Two times a day (BID) | ORAL | Status: DC

## 2014-09-13 MED ORDER — METHOCARBAMOL 500 MG PO TABS: 1000.0000 mg | ORAL_TABLET | Freq: Four times a day (QID) | ORAL | Status: DC
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
## 2014-09-14 NOTE — ED Provider Notes (Signed)
Medical screening examination/treatment/procedure(s) were performed by non-physician practitioner and as supervising physician I was immediately available for consultation/collaboration.    Linwood DibblesJon Eden Toohey, MD 09/14/14 [phone]

## 2015-03-22 ENCOUNTER — Encounter (HOSPITAL_COMMUNITY): Payer: Self-pay | Admitting: Emergency Medicine

## 2015-03-22 ENCOUNTER — Other Ambulatory Visit (HOSPITAL_COMMUNITY)
Admission: RE | Admit: 2015-03-22 | Discharge: 2015-03-22 | Disposition: A | Discharge status: Home / Self Care | Payer: Medicaid Other | Source: Ambulatory Visit | Attending: Family Medicine | Admitting: Family Medicine

## 2015-03-22 ENCOUNTER — Emergency Department (INDEPENDENT_AMBULATORY_CARE_PROVIDER_SITE_OTHER)
Admission: EM | Admit: 2015-03-22 | Discharge: 2015-03-22 | Disposition: A | Discharge status: Home / Self Care | Payer: Medicaid Other | Source: Home / Self Care | Attending: Family Medicine | Admitting: Family Medicine

## 2015-03-22 DIAGNOSIS — Z113 Encounter for screening for infections with a predominantly sexual mode of transmission: Secondary | ICD-10-CM | POA: Diagnosis present

## 2015-03-22 DIAGNOSIS — N760 Acute vaginitis: Secondary | ICD-10-CM

## 2015-03-22 LAB — POCT URINALYSIS DIP (DEVICE)
BILIRUBIN URINE: NEGATIVE
Glucose, UA: NEGATIVE mg/dL
Ketones, ur: NEGATIVE mg/dL
Nitrite: NEGATIVE
Protein, ur: NEGATIVE mg/dL
Urobilinogen, UA: 0.2 mg/dL (ref 0.0–1.0)
pH: 5.5 (ref 5.0–8.0)

## 2015-03-22 LAB — POCT PREGNANCY, URINE: Preg Test, Ur: NEGATIVE

## 2015-03-22 MED ORDER — METRONIDAZOLE 500 MG PO TABS: 500.0000 mg | ORAL_TABLET | Freq: Two times a day (BID) | ORAL | Status: DC

## 2015-03-22 MED ORDER — FLUCONAZOLE 150 MG PO TABS: 150.0000 mg | ORAL_TABLET | Freq: Once | ORAL | Status: DC

## 2015-03-22 NOTE — ED Notes (Signed)
Call back number verified.  

## 2015-03-22 NOTE — ED Provider Notes (Signed)
Jennifer Lynn is a 19 y.o. female who presents to Urgent Care today for vaginal irritation without significant discharge. Symptoms present for 5 days. Patient has tried Vagisil on hydrocortisone which has not helped. No fevers chills nausea vomiting or diarrhea. No significant urinary symptoms. She feels well otherwise.   History reviewed. No pertinent past medical history. History reviewed. No pertinent past surgical history. History  Substance Use Topics  . Smoking status: Never Smoker   . Smokeless tobacco: Not on file  . Alcohol Use: No   ROS as above Medications: No current facility-administered medications for this encounter.   Current Outpatient Prescriptions  Medication Sig Dispense Refill  . aspirin-acetaminophen-caffeine (EXCEDRIN MIGRAINE) [phone] MG per tablet Take by mouth every 6 (six) hours as needed for headache.    . fluconazole (DIFLUCAN) 150 MG tablet Take 1 tablet (150 mg total) by mouth once. 1 tablet 1  . metroNIDAZOLE (FLAGYL) 500 MG tablet Take 1 tablet (500 mg total) by mouth 2 (two) times daily. 14 tablet 0   No Known Allergies   Exam:  BP 120/78 mmHg  Pulse 67  Temp(Src) 99.1 F (37.3 C) (Oral)  Resp 18  SpO2 97%  LMP 02/26/2015 Gen: Well NAD HEENT: EOMI,  MMM Lungs: Normal work of breathing. CTABL Heart: RRR no MRG Abd: NABS, Soft. Nondistended, Nontender Exts: Brisk capillary refill, warm and well perfused.  GYN: Normal external genitalia. Vaginal canal with yellowish vaginal discharge. Normal-appearing cervix. Nontender.  UPT negative   Results for orders placed or performed during the hospital encounter of 03/22/15 (from the past 24 hour(s))  POCT urinalysis dip (device)     Status: Abnormal   Collection Time: 03/22/15  1:20 PM  Result Value Ref Range   Glucose, UA NEGATIVE NEGATIVE mg/dL   Bilirubin Urine NEGATIVE NEGATIVE   Ketones, ur NEGATIVE NEGATIVE mg/dL   Specific Gravity, Urine >=1.030 1.005 - 1.030   Hgb urine dipstick  TRACE (A) NEGATIVE   pH 5.5 5.0 - 8.0   Protein, ur NEGATIVE NEGATIVE mg/dL   Urobilinogen, UA 0.2 0.0 - 1.0 mg/dL   Nitrite NEGATIVE NEGATIVE   Leukocytes, UA SMALL (A) NEGATIVE   No results found.  Assessment and Plan: 19 y.o. female with vaginitis treat with fluconazole and metronidazole. Vaginal cytology pending for gonorrhea Chlamydia trichomonas BV and yeast.  Discussed warning signs or symptoms. Please see discharge instructions. Patient expresses understanding.     Rodolph BongEvan S Demico Ploch, MD 03/22/15 [phone]

## 2015-03-22 NOTE — ED Notes (Signed)
C/o vaginal itching onset Tuesday Denies vag d/c, urinary sx, abd/back pain, fevers, chills Trying OTC vagasil and cortisone w/no relief Alert, no signs of acute distress.

## 2015-03-22 NOTE — Discharge Instructions (Signed)
Thank you for coming in today. °If your belly pain worsens, or you have high fever, bad vomiting, blood in your stool or black tarry stool go to the Emergency Room.  ° °Vaginitis °Vaginitis is an inflammation of the vagina. It is most often caused by a change in the normal balance of the bacteria and yeast that live in the vagina. This change in balance causes an overgrowth of certain bacteria or yeast, which causes the inflammation. There are different types of vaginitis, but the most common types are: °· Bacterial vaginosis. °· Yeast infection (candidiasis). °· Trichomoniasis vaginitis. This is a sexually transmitted infection (STI). °· Viral vaginitis. °· Atropic vaginitis. °· Allergic vaginitis. °CAUSES  °The cause depends on the type of vaginitis. Vaginitis can be caused by: °· Bacteria (bacterial vaginosis). °· Yeast (yeast infection). °· A parasite (trichomoniasis vaginitis) °· A virus (viral vaginitis). °· Low hormone levels (atrophic vaginitis). Low hormone levels can occur during pregnancy, breastfeeding, or after menopause. °· Irritants, such as bubble baths, scented tampons, and feminine sprays (allergic vaginitis). °Other factors can change the normal balance of the yeast and bacteria that live in the vagina. These include: °· Antibiotic medicines. °· Poor hygiene. °· Diaphragms, vaginal sponges, spermicides, birth control pills, and intrauterine devices (IUD). °· Sexual intercourse. °· Infection. °· Uncontrolled diabetes. °· A weakened immune system. °SYMPTOMS  °Symptoms can vary depending on the cause of the vaginitis. Common symptoms include: °· Abnormal vaginal discharge. °¨ The discharge is white, gray, or yellow with bacterial vaginosis. °¨ The discharge is thick, white, and cheesy with a yeast infection. °¨ The discharge is frothy and yellow or greenish with trichomoniasis. °· A bad vaginal odor. °¨ The odor is fishy with bacterial vaginosis. °· Vaginal itching, pain, or swelling. °· Painful  intercourse. °· Pain or burning when urinating. °Sometimes, there are no symptoms. °TREATMENT  °Treatment will vary depending on the type of infection.  °· Bacterial vaginosis and trichomoniasis are often treated with antibiotic creams or pills. °· Yeast infections are often treated with antifungal medicines, such as vaginal creams or suppositories. °· Viral vaginitis has no cure, but symptoms can be treated with medicines that relieve discomfort. Your sexual partner should be treated as well. °· Atrophic vaginitis may be treated with an estrogen cream, pill, suppository, or vaginal ring. If vaginal dryness occurs, lubricants and moisturizing creams may help. You may be told to avoid scented soaps, sprays, or douches. °· Allergic vaginitis treatment involves quitting the use of the product that is causing the problem. Vaginal creams can be used to treat the symptoms. °HOME CARE INSTRUCTIONS  °· Take all medicines as directed by your caregiver. °· Keep your genital area clean and dry. Avoid soap and only rinse the area with water. °· Avoid douching. It can remove the healthy bacteria in the vagina. °· Do not use tampons or have sexual intercourse until your vaginitis has been treated. Use sanitary pads while you have vaginitis. °· Wipe from front to back. This avoids the spread of bacteria from the rectum to the vagina. °· Let air reach your genital area. °¨ Wear cotton underwear to decrease moisture buildup. °¨ Avoid wearing underwear while you sleep until your vaginitis is gone. °¨ Avoid tight pants and underwear or nylons without a cotton panel. °¨ Take off wet clothing (especially bathing suits) as soon as possible. °· Use mild, non-scented products. Avoid using irritants, such as: °¨ Scented feminine sprays. °¨ Fabric softeners. °¨ Scented detergents. °¨ Scented tampons. °¨ Scented   soaps or bubble baths. °· Practice safe sex and use condoms. Condoms may prevent the spread of trichomoniasis and viral  vaginitis. °SEEK MEDICAL CARE IF:  °· You have abdominal pain. °· You have a fever or persistent symptoms for more than 2-3 days. °· You have a fever and your symptoms suddenly get worse. °Document Released: 09/26/2007 Document Revised: 08/23/2012 Document Reviewed: 05/11/2012 °[employer]® Patient Information ©2015 [employer]. This information is not intended to replace advice given to you by your health care provider. Make sure you discuss any questions you have with your health care provider. ° °

## 2015-03-24 LAB — CERVICOVAGINAL ANCILLARY ONLY
Chlamydia: NEGATIVE
Neisseria Gonorrhea: NEGATIVE
WET PREP (BD AFFIRM): NEGATIVE

## 2015-03-25 ENCOUNTER — Ambulatory Visit: Payer: Medicaid Other | Admitting: Internal Medicine

## 2015-03-25 LAB — URINE CULTURE: Special Requests: NORMAL

## 2015-03-26 ENCOUNTER — Telehealth (HOSPITAL_COMMUNITY): Payer: Self-pay | Admitting: Family Medicine

## 2015-03-26 MED ORDER — AMOXICILLIN 500 MG PO CAPS: 1000.0000 mg | ORAL_CAPSULE | Freq: Two times a day (BID) | ORAL | Status: DC

## 2015-03-26 NOTE — ED Notes (Signed)
Urine Culture positive for Enterococcus.  Tx with amoxicillin.  I called the patient and left a message asking for call back and to notify her that her ABX were sent in.   Rodolph BongEvan S Corey, MD 03/26/15 [phone]

## 2015-03-26 NOTE — ED Notes (Signed)
Patient called w questions about her new RX, as she had not had anyone advise her about the changes. Spoke w Narda BondsLee Presson who verified this new antibiotic should cover the UTI, and patient should stop the flagyl RX

## 2015-04-02 ENCOUNTER — Ambulatory Visit: Payer: Medicaid Other | Admitting: Internal Medicine

## 2015-04-09 ENCOUNTER — Ambulatory Visit: Payer: Medicaid Other | Attending: Internal Medicine | Admitting: Internal Medicine

## 2015-04-09 ENCOUNTER — Encounter: Payer: Self-pay | Admitting: Internal Medicine

## 2015-04-09 VITALS — BP 113/74 | HR 79 | Temp 98.0°F | Resp 16 | Ht 64.0 in | Wt 249.0 lb

## 2015-04-09 DIAGNOSIS — Z309 Encounter for contraceptive management, unspecified: Secondary | ICD-10-CM | POA: Diagnosis not present

## 2015-04-09 DIAGNOSIS — Z30011 Encounter for initial prescription of contraceptive pills: Secondary | ICD-10-CM | POA: Diagnosis present

## 2015-04-09 DIAGNOSIS — N946 Dysmenorrhea, unspecified: Secondary | ICD-10-CM | POA: Insufficient documentation

## 2015-04-09 LAB — CBC
HEMATOCRIT: 36.9 % (ref 36.0–46.0)
HEMOGLOBIN: 12.2 g/dL (ref 12.0–15.0)
MCH: 26.1 pg (ref 26.0–34.0)
MCHC: 33.1 g/dL (ref 30.0–36.0)
MCV: 79 fL (ref 78.0–100.0)
MPV: 9.1 fL (ref 8.6–12.4)
PLATELETS: 293 10*3/uL (ref 150–400)
RBC: 4.67 MIL/uL (ref 3.87–5.11)
RDW: 14.9 % (ref 11.5–15.5)
WBC: 5.3 10*3/uL (ref 4.0–10.5)

## 2015-04-09 LAB — BASIC METABOLIC PANEL
BUN: 10 mg/dL (ref 6–23)
CO2: 21 mEq/L (ref 19–32)
CREATININE: 0.63 mg/dL (ref 0.50–1.10)
Calcium: 9 mg/dL (ref 8.4–10.5)
Chloride: 103 mEq/L (ref 96–112)
GLUCOSE: 86 mg/dL (ref 70–99)
Potassium: 3.8 mEq/L (ref 3.5–5.3)
Sodium: 136 mEq/L (ref 135–145)

## 2015-04-09 LAB — POCT URINE PREGNANCY: Preg Test, Ur: NEGATIVE

## 2015-04-09 MED ORDER — NORGESTIMATE-ETH ESTRADIOL 0.25-35 MG-MCG PO TABS: 1.0000 | ORAL_TABLET | Freq: Every day | ORAL | Status: DC

## 2015-04-09 NOTE — Progress Notes (Signed)
Patient ID: Jennifer Lynn, female   DOB: [birth year], 19 y.o.   MRN: [id number]  WUJ:[id number]  [id number]  DOB - December 26, 1995  CC:  Chief Complaint  Patient presents with  . Establish Care       HPI: Jennifer Lynn is a 19 y.o. female here today to establish medical care.  She has no past medical history and is not on any medications. 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 She is interested in beginning birth control today. LMP 4/9-4/13. She reports that every other month she has a cycle that last 3 days and then 5 days the next month. She reports that she has several family members that have uterine fibroids. She reports that she has very light cycles but they are painful causing her to miss work and school.   Patient has No headache, No chest pain, No Nausea, No new weakness tingling or numbness, No Cough - SOB.  No Known Allergies History reviewed. No pertinent past medical history. Current Outpatient Prescriptions on File Prior to Visit  Medication Sig Dispense Refill  . amoxicillin (AMOXIL) 500 MG capsule Take 2 capsules (1,000 mg total) by mouth 2 (two) times daily. (Patient not taking: Reported on 04/09/2015) 28 capsule 0  . aspirin-acetaminophen-caffeine (EXCEDRIN MIGRAINE) 250-250-65 MG per tablet Take by mouth every 6 (six) hours as needed for headache.    . fluconazole (DIFLUCAN) 150 MG tablet Take 1 tablet (150 mg total) by mouth once. (Patient not taking: Reported on 04/09/2015) 1 tablet 1  . metroNIDAZOLE (FLAGYL) 500 MG tablet Take 1 tablet (500 mg total) by mouth 2 (two) times daily. (Patient not taking: Reported on 04/09/2015) 14 tablet 0   No current facility-administered medications on file prior to visit.   Family History  Problem Relation Age of Onset  . Diabetes Maternal Grandmother   . Diabetes Paternal Grandmother    History   Social History  . Marital Status: Single    Spouse Name: N/A  . Number  of Children: N/A  . Years of Education: N/A   Occupational History  . Not on file.   Social History Main Topics  . Smoking status: Never Smoker   . Smokeless tobacco: Not on file  . Alcohol Use: No  . Drug Use: No  . Sexual Activity: Yes    Birth Control/ Protection: Condom   Other Topics Concern  . Not on file   Social History Narrative    Review of Systems: Constitutional: Negative for fever, chills, diaphoresis, activity change, appetite change and fatigue. HENT: Negative for ear pain, nosebleeds, congestion, facial swelling, rhinorrhea, neck pain, neck stiffness and ear discharge.  Eyes: Negative for pain, discharge, redness, itching and visual disturbance. Respiratory: Negative for cough, choking, chest tightness, shortness of breath, wheezing and stridor.  Cardiovascular: Negative for chest pain, palpitations and leg swelling. Gastrointestinal: Negative for abdominal distention. Genitourinary: Negative for dysuria, urgency, frequency, hematuria, flank pain, decreased urine volume, difficulty urinating and dyspareunia.  Musculoskeletal: Negative for back pain, joint swelling, arthralgia and gait problem. Neurological: Negative for dizziness, tremors, seizures, syncope, facial asymmetry, speech difficulty, weakness, light-headedness, numbness and headaches.  Hematological: Negative for adenopathy. Does not bruise/bleed easily. Psychiatric/Behavioral: Negative for hallucinations, behavioral problems, confusion, dysphoric mood, decreased concentration and agitation.    Objective:   Filed Vitals:   04/09/15 1040  BP: 113/74  Pulse: 79  Temp: 98 F (36.7 C)  Resp: 16    Physical Exam: Constitutional:  Patient appears well-developed and well-nourished. No distress. HENT: Normocephalic, atraumatic, External right and left ear normal. Oropharynx is clear and moist.  Eyes: Conjunctivae and EOM are normal. PERRLA, no scleral icterus. Neck: Normal ROM. Neck supple. No JVD.  No tracheal deviation. No thyromegaly. CVS: RRR, S1/S2 +, no murmurs, no gallops, no carotid bruit.  Pulmonary: Effort and breath sounds normal, no stridor, rhonchi, wheezes, rales.  Abdominal: Soft. BS +, no distension, tenderness, rebound or guarding.  Musculoskeletal: Normal range of motion. No edema and no tenderness.  Skin: Skin is warm and dry. No rash noted. Not diaphoretic. No erythema. No pallor. Psychiatric: Normal mood and affect. Behavior, judgment, thought content normal.  No results found for: WBC, HGB, HCT, MCV, PLT No results found for: CREATININE, BUN, NA, K, CL, CO2  No results found for: HGBA1C Lipid Panel  No results found for: CHOL, TRIG, HDL, CHOLHDL, VLDL, LDLCALC     Assessment and plan:   Jennifer Lynn was seen today for establish care.  Diagnoses and all orders for this visit:  BCP (birth control pills) initiation Orders: -     POCT urine pregnancy -     Begin norgestimate-ethinyl estradiol (ORTHO-CYCLEN,SPRINTEC,PREVIFEM) 0.25-35 MG-MCG tablet; Take 1 tablet by mouth daily. -     Basic Metabolic Panel -     CBC I will have patient to come back in 1 month to check her BP relating to adding OCP. I have addressed pro's and con's of medication with patient including increased risk for blood clots.   Return in about 4 weeks (around 05/07/2015) for Nurse Visit-BP check .     Jennifer Lynn, Cameran Ahmed, NP-C [facility] [phone] 04/09/2015, 11:01 AM

## 2015-04-09 NOTE — Progress Notes (Signed)
Pt is here to establish care. Pt is wanting a check up.

## 2015-04-09 NOTE — Patient Instructions (Signed)
Ethinyl Estradiol; Norgestimate tablets What is this medicine? ETHINYL ESTRADIOL; NORGESTIMATE (ETH in il es tra DYE ole; nor JES ti mate) is an oral contraceptive. The products combine two types of female hormones, an estrogen and a progestin. They are used to prevent ovulation and pregnancy. Some products are also used to treat acne in females. This medicine may be used for other purposes; ask your health care provider or pharmacist if you have questions. COMMON BRAND NAME(S): Estarylla, MONO-LINYAH, MonoNessa, Ortho Tri-Cyclen, Ortho Tri-Cyclen Lo, Ortho-Cyclen, Previfem, Sprintec, Tri-Estarylla, TRI-LINYAH, Tri-Lo-Sprintec, Tri-Previfem, Tri-Sprintec, Trinessa What should I tell my health care provider before I take this medicine? They need to know if you have or ever had any of these conditions: -abnormal vaginal bleeding -blood vessel disease or blood clots -breast, cervical, endometrial, ovarian, liver, or uterine cancer -diabetes -gallbladder disease -heart disease or recent heart attack -high blood pressure -high cholesterol -kidney disease -liver disease -migraine headaches -stroke -systemic lupus erythematosus (SLE) -tobacco smoker -an unusual or allergic reaction to estrogens, progestins, other medicines, foods, dyes, or preservatives -pregnant or trying to get pregnant -breast-feeding How should I use this medicine? Take this medicine by mouth. To reduce nausea, this medicine may be taken with food. Follow the directions on the prescription label. Take this medicine at the same time each day and in the order directed on the package. Do not take your medicine more often than directed. Contact your pediatrician regarding the use of this medicine in children. Special care may be needed. This medicine has been used in female children who have started having menstrual periods. A patient package insert for the product will be given with each prescription and refill. Read this sheet  carefully each time. The sheet may change frequently. Overdosage: If you think you have taken too much of this medicine contact a poison control center or emergency room at once. NOTE: This medicine is only for you. Do not share this medicine with others. What if I miss a dose? If you miss a dose, refer to the patient information sheet you received with your medicine for direction. If you miss more than one pill, this medicine may not be as effective and you may need to use another form of birth control. What may interact with this medicine? -acetaminophen -antibiotics or medicines for infections, especially rifampin, rifabutin, rifapentine, and griseofulvin, and possibly penicillins or tetracyclines -aprepitant -ascorbic acid (vitamin C) -atorvastatin -barbiturate medicines, such as phenobarbital -bosentan -carbamazepine -caffeine -clofibrate -cyclosporine -dantrolene -doxercalciferol -felbamate -grapefruit juice -hydrocortisone -medicines for anxiety or sleeping problems, such as diazepam or temazepam -medicines for diabetes, including pioglitazone -mineral oil -modafinil -mycophenolate -nefazodone -oxcarbazepine -phenytoin -prednisolone -ritonavir or other medicines for HIV infection or AIDS -rosuvastatin -selegiline -soy isoflavones supplements -St. John's wort -tamoxifen or raloxifene -theophylline -thyroid hormones -topiramate -warfarin This list may not describe all possible interactions. Give your health care provider a list of all the medicines, herbs, non-prescription drugs, or dietary supplements you use. Also tell them if you smoke, drink alcohol, or use illegal drugs. Some items may interact with your medicine. What should I watch for while using this medicine? Visit your doctor or health care professional for regular checks on your progress. You will need a regular breast and pelvic exam and Pap smear while on this medicine. You should also discuss the need  for regular mammograms with your health care professional, and follow his or her guidelines for these tests. This medicine can make your body retain fluid, making your fingers, hands, or ankles   swell. Your blood pressure can go up. Contact your doctor or health care professional if you feel you are retaining fluid. Use an additional method of contraception during the first cycle that you take these tablets. If you have any reason to think you are pregnant, stop taking this medicine right away and contact your doctor or health care professional. If you are taking this medicine for hormone related problems, it may take several cycles of use to see improvement in your condition. Smoking increases the risk of getting a blood clot or having a stroke while you are taking birth control pills, especially if you are more than 21 years old. You are strongly advised not to smoke. This medicine can make you more sensitive to the sun. Keep out of the sun. If you cannot avoid being in the sun, wear protective clothing and use sunscreen. Do not use sun lamps or tanning beds/booths. If you wear contact lenses and notice visual changes, or if the lenses begin to feel uncomfortable, consult your eye care specialist. In some women, tenderness, swelling, or minor bleeding of the gums may occur. Notify your dentist if this happens. Brushing and flossing your teeth regularly may help limit this. See your dentist regularly and inform your dentist of the medicines you are taking. If you are going to have elective surgery, you may need to stop taking this medicine before the surgery. Consult your health care professional for advice. This medicine does not protect you against HIV infection (AIDS) or any other sexually transmitted diseases. What side effects may I notice from receiving this medicine? Side effects that you should report to your doctor or health care professional as soon as possible: -breast tissue changes or  discharge -changes in vaginal bleeding during your period or between your periods -chest pain -coughing up blood -dizziness or fainting spells -headaches or migraines -leg, arm or groin pain -severe or sudden headaches -stomach pain (severe) -sudden shortness of breath -sudden loss of coordination, especially on one side of the body -speech problems -symptoms of vaginal infection like itching, irritation or unusual discharge -tenderness in the upper abdomen -vomiting -weakness or numbness in the arms or legs, especially on one side of the body -yellowing of the eyes or skin Side effects that usually do not require medical attention (report to your doctor or health care professional if they continue or are bothersome): -breakthrough bleeding and spotting that continues beyond the 3 initial cycles of pills -breast tenderness -mood changes, anxiety, depression, frustration, anger, or emotional outbursts -increased sensitivity to sun or ultraviolet light -nausea -skin rash, acne, or brown spots on the skin -weight gain (slight) This list may not describe all possible side effects. Call your doctor for medical advice about side effects. You may report side effects to FDA at 1-800-FDA-1088. Where should I keep my medicine? Keep out of the reach of children. Store at room temperature between 15 and 30 degrees C (59 and 86 degrees F). Throw away any unused medicine after the expiration date. NOTE: This sheet is a summary. It may not cover all possible information. If you have questions about this medicine, talk to your doctor, pharmacist, or health care provider.  2015, [employer]vier/Gold Standard. (2008-11-14 13:40:47)  

## 2015-04-14 ENCOUNTER — Telehealth: Payer: Self-pay | Admitting: *Deleted

## 2015-04-14 NOTE — Telephone Encounter (Signed)
-----   Message from Ambrose FinlandValerie A Keck, NP sent at 04/10/2015  6:05 PM EDT ----- Labs are normal. No anemia

## 2015-04-14 NOTE — Telephone Encounter (Signed)
Pt is aware of her lab results.  

## 2015-06-20 ENCOUNTER — Telehealth: Payer: Self-pay | Admitting: Internal Medicine

## 2015-06-20 NOTE — Telephone Encounter (Signed)
Pt. Has questions regarding medication.Jennifer Lynn.Jennifer Lynn.Jennifer Lynn.Jennifer Lynn.please contact patient.Jennifer Lynn..Jennifer Lynn

## 2015-06-23 ENCOUNTER — Telehealth: Payer: Self-pay

## 2015-06-23 NOTE — Telephone Encounter (Signed)
Patient called nurse, patient verified date of birth. Patient would like phentamine for weight loss. Nurse advised patient to make appointment with provider.  Patient transferred to front office staff to make appointment.

## 2015-06-23 NOTE — Telephone Encounter (Signed)
Pt calling to speak to nurse regarding medication, please f/u with pt.

## 2015-06-26 ENCOUNTER — Ambulatory Visit: Payer: Medicaid Other | Attending: Internal Medicine | Admitting: Internal Medicine

## 2015-06-26 ENCOUNTER — Encounter: Payer: Self-pay | Admitting: Internal Medicine

## 2015-06-26 VITALS — BP 128/88 | HR 76 | Temp 97.8°F | Resp 16 | Wt 246.0 lb

## 2015-06-26 DIAGNOSIS — Z68.41 Body mass index (BMI) pediatric, greater than or equal to 95th percentile for age: Secondary | ICD-10-CM | POA: Diagnosis not present

## 2015-06-26 DIAGNOSIS — Z6841 Body Mass Index (BMI) 40.0 and over, adult: Secondary | ICD-10-CM | POA: Insufficient documentation

## 2015-06-26 NOTE — Patient Instructions (Signed)
Download a weight loss app on your phone and track your calorie intake. I would try not to go over 2,000-2,100 calories per day.    Exercise to Lose Weight Exercise and a healthy diet may help you lose weight. Your doctor may suggest specific exercises. EXERCISE IDEAS AND TIPS  Choose low-cost things you enjoy doing, such as walking, bicycling, or exercising to workout videos.  Take stairs instead of the elevator.  Walk during your lunch break.  Park your car further away from work or school.  Go to a gym or an exercise class.  Start with 5 to 10 minutes of exercise each day. Build up to 30 minutes of exercise 4 to 6 days a week.  Wear shoes with good support and comfortable clothes.  Stretch before and after working out.  Work out until you breathe harder and your heart beats faster.  Drink extra water when you exercise.  Do not do so much that you hurt yourself, feel dizzy, or get very short of breath. Exercises that burn about 150 calories:  Running 1  miles in 15 minutes.  Playing volleyball for 45 to 60 minutes.  Washing and waxing a car for 45 to 60 minutes.  Playing touch football for 45 minutes.  Walking 1  miles in 35 minutes.  Pushing a stroller 1  miles in 30 minutes.  Playing basketball for 30 minutes.  Raking leaves for 30 minutes.  Bicycling 5 miles in 30 minutes.  Walking 2 miles in 30 minutes.  Dancing for 30 minutes.  Shoveling snow for 15 minutes.  Swimming laps for 20 minutes.  Walking up stairs for 15 minutes.  Bicycling 4 miles in 15 minutes.  Gardening for 30 to 45 minutes.  Jumping rope for 15 minutes.  Washing windows or floors for 45 to 60 minutes. Document Released: 01/01/2011 Document Revised: 02/21/2012 Document Reviewed: 01/01/2011 [facility] Patient Information 2015 [place], [place]. This information is not intended to replace advice given to you by your health care provider. Make sure you discuss any questions you have  with your health care provider. Calorie Counting for Weight Loss Calories are energy you get from the things you eat and drink. Your body uses this energy to keep you going throughout the day. The number of calories you eat affects your weight. When you eat more calories than your body needs, your body stores the extra calories as fat. When you eat fewer calories than your body needs, your body burns fat to get the energy it needs. Calorie counting means keeping track of how many calories you eat and drink each day. If you make sure to eat fewer calories than your body needs, you should lose weight. In order for calorie counting to work, you will need to eat the number of calories that are right for you in a day to lose a healthy amount of weight per week. A healthy amount of weight to lose per week is usually 1-2 lb (0.5-0.9 kg). A dietitian can determine how many calories you need in a day and give you suggestions on how to reach your calorie goal.  WHAT IS MY MY PLAN? My goal is to have __________ calories per day.  If I have this many calories per day, I should lose around __________ pounds per week. WHAT DO I NEED TO KNOW ABOUT CALORIE COUNTING? In order to meet your daily calorie goal, you will need to:  Find out how many calories are in each food you would  like to eat. Try to do this before you eat.  Decide how much of the food you can eat.  Write down what you ate and how many calories it had. Doing this is called keeping a food log. WHERE DO I FIND CALORIE INFORMATION? The number of calories in a food can be found on a Nutrition Facts label. Note that all the information on a label is based on a specific serving of the food. If a food does not have a Nutrition Facts label, try to look up the calories online or ask your dietitian for help. HOW DO I DECIDE HOW MUCH TO EAT? To decide how much of the food you can eat, you will need to consider both the number of calories in one serving and the  size of one serving. This information can be found on the Nutrition Facts label. If a food does not have a Nutrition Facts label, look up the information online or ask your dietitian for help. Remember that calories are listed per serving. If you choose to have more than one serving of a food, you will have to multiply the calories per serving by the amount of servings you plan to eat. For example, the label on a package of bread might say that a serving size is 1 slice and that there are 90 calories in a serving. If you eat 1 slice, you will have eaten 90 calories. If you eat 2 slices, you will have eaten 180 calories. HOW DO I KEEP A FOOD LOG? After each meal, record the following information in your food log:  What you ate.  How much of it you ate.  How many calories it had.  Then, add up your calories. Keep your food log near you, such as in a small notebook in your pocket. Another option is to use a mobile app or website. Some programs will calculate calories for you and show you how many calories you have left each time you add an item to the log. WHAT ARE SOME CALORIE COUNTING TIPS?  Use your calories on foods and drinks that will fill you up and not leave you hungry. Some examples of this include foods like nuts and nut butters, vegetables, lean proteins, and high-fiber foods (more than 5 g fiber per serving).  Eat nutritious foods and avoid empty calories. Empty calories are calories you get from foods or beverages that do not have many nutrients, such as candy and soda. It is better to have a nutritious high-calorie food (such as an avocado) than a food with few nutrients (such as a bag of chips).  Know how many calories are in the foods you eat most often. This way, you do not have to look up how many calories they have each time you eat them.  Look out for foods that may seem like low-calorie foods but are really high-calorie foods, such as baked goods, soda, and fat-free  candy.  Pay attention to calories in drinks. Drinks such as sodas, specialty coffee drinks, alcohol, and juices have a lot of calories yet do not fill you up. Choose low-calorie drinks like water and diet drinks.  Focus your calorie counting efforts on higher calorie items. Logging the calories in a garden salad that contains only vegetables is less important than calculating the calories in a milk shake.  Find a way of tracking calories that works for you. Get creative. Most people who are successful find ways to keep track of how much  they eat in a day, even if they do not count every calorie. WHAT ARE SOME PORTION CONTROL TIPS?  Know how many calories are in a serving. This will help you know how many servings of a certain food you can have.  Use a measuring cup to measure serving sizes. This is helpful when you start out. With time, you will be able to estimate serving sizes for some foods.  Take some time to put servings of different foods on your favorite plates, bowls, and cups so you know what a serving looks like.  Try not to eat straight from a bag or box. Doing this can lead to overeating. Put the amount you would like to eat in a cup or on a plate to make sure you are eating the right portion.  Use smaller plates, glasses, and bowls to prevent overeating. This is a quick and easy way to practice portion control. If your plate is smaller, less food can fit on it.  Try not to multitask while eating, such as watching TV or using your computer. If it is time to eat, sit down at a table and enjoy your food. Doing this will help you to start recognizing when you are full. It will also make you more aware of what and how much you are eating. HOW CAN I CALORIE COUNT WHEN EATING OUT?  Ask for smaller portion sizes or child-sized portions.  Consider sharing an entree and sides instead of getting your own entree.  If you get your own entree, eat only half. Ask for a box at the beginning of  your meal and put the rest of your entree in it so you are not tempted to eat it.  Look for the calories on the menu. If calories are listed, choose the lower calorie options.  Choose dishes that include vegetables, fruits, whole grains, low-fat dairy products, and lean protein. Focusing on smart food choices from each of the 5 food groups can help you stay on track at restaurants.  Choose items that are boiled, broiled, grilled, or steamed.  Choose water, milk, unsweetened iced tea, or other drinks without added sugars. If you want an alcoholic beverage, choose a lower calorie option. For example, a regular margarita can have up to 700 calories and a glass of wine has around 150.  Stay away from items that are buttered, battered, fried, or served with cream sauce. Items labeled "crispy" are usually fried, unless stated otherwise.  Ask for dressings, sauces, and syrups on the side. These are usually very high in calories, so do not eat much of them.  Watch out for salads. Many people think salads are a healthy option, but this is often not the case. Many salads come with bacon, fried chicken, lots of cheese, fried chips, and dressing. All of these items have a lot of calories. If you want a salad, choose a garden salad and ask for grilled meats or steak. Ask for the dressing on the side, or ask for olive oil and vinegar or lemon to use as dressing.  Estimate how many servings of a food you are given. For example, a serving of cooked rice is  cup or about the size of half a tennis ball or one cupcake wrapper. Knowing serving sizes will help you be aware of how much food you are eating at restaurants. The list below tells you how big or small some common portion sizes are based on everyday objects.  1 oz--4 stacked  dice.  3 oz--1 deck of cards.  1 tsp--1 dice.  1 Tbsp-- a Ping-Pong ball.  2 Tbsp--1 Ping-Pong ball.   cup--1 tennis ball or 1 cupcake wrapper.  1 cup--1 baseball. Document  Released: 11/29/2005 Document Revised: 04/15/2014 Document Reviewed: 10/04/2013 [facility] Patient Information 2015 [place], [place]. This information is not intended to replace advice given to you by your health care provider. Make sure you discuss any questions you have with your health care provider.

## 2015-06-26 NOTE — Progress Notes (Signed)
Patient here for follow up Patient is asking for a prescription for fentermine to help Her loose weight

## 2015-06-26 NOTE — Progress Notes (Signed)
Patient ID: Jennifer Lynn, female   DOB: 1996/04/16, 19 y.o.   MRN: [id number]  CC: obesity  HPI: Jennifer Lynn is a 19 y.o. female here today for a follow up visit.  Patient has past medical history of morbid obesity.  Patient reports that she needs help with weight loss and would like to try Phentermine. She currently exercises 4 times weekly for 1 hour. She reports that she often goes until early afternoon before eating her first meal due to lack of appetite. She reports that she does not eat fried foods and most of her meals are usually children portions.   Patient has No headache, No chest pain, No abdominal pain - No Nausea, No new weakness tingling or numbness, No Cough - SOB.  No Known Allergies History reviewed. No pertinent past medical history. Current Outpatient Prescriptions on File Prior to Visit  Medication Sig Dispense Refill  . norgestimate-ethinyl estradiol (ORTHO-CYCLEN,SPRINTEC,PREVIFEM) 0.25-35 MG-MCG tablet Take 1 tablet by mouth daily. 1 Package 11   No current facility-administered medications on file prior to visit.   Family History  Problem Relation Age of Onset  . Diabetes Maternal Grandmother   . Diabetes Paternal Grandmother    History   Social History  . Marital Status: Single    Spouse Name: N/A  . Number of Children: N/A  . Years of Education: N/A   Occupational History  . Not on file.   Social History Main Topics  . Smoking status: Never Smoker   . Smokeless tobacco: Not on file  . Alcohol Use: No  . Drug Use: No  . Sexual Activity: Yes    Birth Control/ Protection: Condom   Other Topics Concern  . Not on file   Social History Narrative    Review of Systems: See HPI  Objective:   Filed Vitals:   06/26/15 1538  BP: 128/88  Pulse: 76  Temp: 97.8 F (36.6 C)  Resp: 16    Physical Exam  Constitutional:  Obese   Neck: No thyromegaly present.  Cardiovascular: Normal rate, regular rhythm and normal heart sounds.    Pulmonary/Chest: Effort normal and breath sounds normal.     Lab Results  Component Value Date   WBC 5.3 04/09/2015   HGB 12.2 04/09/2015   HCT 36.9 04/09/2015   MCV 79.0 04/09/2015   PLT 293 04/09/2015   Lab Results  Component Value Date   CREATININE 0.63 04/09/2015   BUN 10 04/09/2015   NA 136 04/09/2015   K 3.8 04/09/2015   CL 103 04/09/2015   CO2 21 04/09/2015    No results found for: HGBA1C Lipid Panel  No results found for: CHOL, TRIG, HDL, CHOLHDL, VLDL, LDLCALC     Assessment and plan:   Jennifer Lynn was seen today for follow-up.  Diagnoses and all orders for this visit:  Morbid obesity with BMI of 40.0-44.9, adult Orders: -     Amb Referral to Nutrition and Diabetic E Due to patients lack of appetite I do not feel Phentermine would be the best option. I have went over weight loss strategies with patient and meal planning. I will refer her to see a nutritionist.  Return if symptoms worsen or fail to improve.       Ambrose FinlandValerie A Ireland Chagnon, NP-C [facility] [phone] 06/26/2015, 3:57 PM

## 2015-08-04 ENCOUNTER — Encounter: Payer: Self-pay | Admitting: Dietician

## 2015-08-04 ENCOUNTER — Encounter: Payer: Medicaid Other | Attending: Internal Medicine | Admitting: Dietician

## 2015-08-04 VITALS — Ht 63.0 in | Wt 246.6 lb

## 2015-08-04 DIAGNOSIS — Z6841 Body Mass Index (BMI) 40.0 and over, adult: Secondary | ICD-10-CM | POA: Insufficient documentation

## 2015-08-04 DIAGNOSIS — Z713 Dietary counseling and surveillance: Secondary | ICD-10-CM | POA: Insufficient documentation

## 2015-08-04 NOTE — Patient Instructions (Addendum)
-  Work on eating more throughout the day  -Yogurt, peanut butter or cheese on toast, deli [place] and cheese, Atkins shake   -Aim to eat at least 3x a day starting in the morning  -Work on bringing snacks to work  -Try to have a protein and carbohydrate food with Optometrist  -Work on drinking mainly water - Limit juice and soda  -Try some different water flavorings  -Try mixing V8 fruit juice or diet cranberry juice with sparkling water  -Use cut up fruit in your water  -Work on increasing physical activity  -Walking and playing sports  -Set a goal of doing something active for at least 30 minutes 3x a week

## 2015-08-04 NOTE — Progress Notes (Signed)
  Medical Nutrition Therapy:  Appt start time: 1045 end time:  1125   Assessment:  Primary concerns today: Jennifer Lynn is here stating that she wants to lose weight. She reports that she does not have an appetite and may not eat until 5pm some days. She states that she will develop a headache before she remembers to eat. Jennifer Lynn also complains of being tired a lot. She goes to school in the mornings and works as a Lawyer from 3pm-11pm. Has lost about 5 pounds recently but does not know how she did it. She does not "like the look of food" and would rather have "snacks all day every day." She has noticed a lack of appetite since November of last year and has states that her weight fluctuates frequently. She lives with her mom and sister. Her mom does most of the grocery shopping and cooking. Her mom does not eat meat.  Preferred Learning Style:   No preference indicated   Learning Readiness:   Contemplating  Ready  MEDICATIONS: oral contraceptive   DIETARY INTAKE:  Avoided foods include beef, fresh citrus fruit.    24-hr recall:  Wakes up around 9 am   B ( AM): sometimes toast with butter and coffee with milk and 4 Tbsp sugar or creamer  Snk ( AM):   L ( PM):  Snk ( PM):  D (5-6 PM): peanut butter cracker, chips, and cheezits from vending machine at work OR broccoli and rice from [employer] D (11 PM): baked or fried fish and vegetables OR veggie chili  Beverages: 2-3 bottles of water, V8 fruit juice, ginger ale (70% of beverages consist of juice and soda, about 16 oz total)  Usual physical activity: none outside of work  Estimated energy needs: 1800-2000 calories  Progress Towards Goal(s):  In progress.   Nutritional Diagnosis:  -3.3 Overweight/obesity As related to erratic meal pattern, inappropriate food choices, and physical inactivity.  As evidenced by BMI 43.8.    Intervention:  Nutrition counseling provided. Goals: -Work on eating more throughout the  day  -Yogurt, peanut butter or cheese on toast, deli [place] and cheese, Atkins shake   -Aim to eat at least 3x a day starting in the morning  -Work on bringing snacks to work -Try to have a protein and carbohydrate food with Optometrist -Work on drinking mainly water - Limit juice and soda  -Try some different water flavorings  -Try mixing V8 fruit juice or diet cranberry juice with sparkling water  -Use cut up fruit in your water -Work on increasing physical activity  -Walking and playing sports  -Set a goal of doing something active for at least 30 minutes 3x a week  Teaching Method Utilized:  Visual Auditory Hands on  Handouts given during visit include:  Meal planning card  MyPlate  Barriers to learning/adherence to lifestyle change: none  Demonstrated degree of understanding via:  Teach Back   Monitoring/Evaluation:  Dietary intake, exercise, and body weight prn.

## 2015-10-10 ENCOUNTER — Emergency Department (INDEPENDENT_AMBULATORY_CARE_PROVIDER_SITE_OTHER): Payer: Medicaid Other

## 2015-10-10 ENCOUNTER — Emergency Department (HOSPITAL_COMMUNITY)
Admission: EM | Admit: 2015-10-10 | Discharge: 2015-10-10 | Disposition: A | Discharge status: Home / Self Care | Payer: Medicaid Other | Source: Home / Self Care | Attending: Family Medicine | Admitting: Family Medicine

## 2015-10-10 ENCOUNTER — Encounter (HOSPITAL_COMMUNITY): Payer: Self-pay

## 2015-10-10 DIAGNOSIS — G8911 Acute pain due to trauma: Secondary | ICD-10-CM

## 2015-10-10 DIAGNOSIS — M25512 Pain in left shoulder: Secondary | ICD-10-CM

## 2015-10-10 MED ORDER — KETOROLAC TROMETHAMINE 60 MG/2ML IM SOLN
60.0000 mg | Freq: Once | INTRAMUSCULAR | Status: AC
  Administered 2015-10-10: 60 mg via INTRAMUSCULAR

## 2015-10-10 MED ORDER — DICLOFENAC SODIUM 75 MG PO TBEC: 75.0000 mg | DELAYED_RELEASE_TABLET | Freq: Two times a day (BID) | ORAL | Status: DC

## 2015-10-10 MED ORDER — KETOROLAC TROMETHAMINE 60 MG/2ML IM SOLN
INTRAMUSCULAR | Status: AC
  Filled 2015-10-10: qty 2

## 2015-10-10 NOTE — ED Provider Notes (Signed)
CSN: [id number]     Arrival date & time 10/10/15  1559 History   First MD Initiated Contact with Patient 10/10/15 1636     Chief Complaint  Patient presents with  . Shoulder Pain    left   (Consider location/radiation/quality/duration/timing/severity/associated sxs/prior Treatment) HPI   Left shoulder pain. Larey SeatFell one day ago. Patient states that she fell backwards but is unsure of exactly how she fell. States that she felt a popping crunch in her shoulder which popped again and she tried moving it. Pain was immediate. Pain is constant and persistent since the injury. Tylenol with little improvement. Denies any numbness or tingling in the hand. Pain in shoulder radiates actually towards the neck and a few inches distally down the arm. No previous trauma to the shoulder. No LOC.  Patient is currently on her menstrual cycle.  History reviewed. No pertinent past medical history. History reviewed. No pertinent past surgical history. Family History  Problem Relation Age of Onset  . Diabetes Maternal Grandmother   . Diabetes Paternal Grandmother    Social History  Substance Use Topics  . Smoking status: Never Smoker   . Smokeless tobacco: None  . Alcohol Use: No   OB History    No data available     Review of Systems Per HPI with all other pertinent systems negative.   Allergies  Review of patient's allergies indicates no known allergies.  Home Medications   Prior to Admission medications   Medication Sig Start Date End Date Taking? Authorizing Provider  norgestimate-ethinyl estradiol (ORTHO-CYCLEN,SPRINTEC,PREVIFEM) 0.25-35 MG-MCG tablet Take 1 tablet by mouth daily. 04/09/15   Ambrose FinlandValerie A Keck, NP   Meds Ordered and Administered this Visit   Medications  ketorolac (TORADOL) injection 60 mg (60 mg Intramuscular Given 10/10/15 1709)    BP 129/75 mmHg  Pulse 74  Temp(Src) 98.2 F (36.8 C) (Oral)  Resp 18  SpO2 100%  LMP 10/10/2015 (Exact Date) No data  found.   Physical Exam Physical Exam  Constitutional: oriented to person, place, and time. appears well-developed and well-nourished. No distress.  HENT:  Head: Normocephalic and atraumatic.  Eyes: EOMI. PERRL.  Neck: Normal range of motion.  Cardiovascular: RRR, no m/r/g, 2+ distal pulses,  Pulmonary/Chest: Effort normal and breath sounds normal. No respiratory distress.  Abdominal: Soft. Bowel sounds are normal. NonTTP, no distension.  Musculoskeletal: Limited range of motion of the left arm secondary to pain. Passive range of motion limited to 90 with abduction, 90 flexion. Hawkins positive, patient unable to perform behind the back exercises, cross body positive. No bony upper body appreciated. Clavicle and before meals joint appear intact on palpation.  Neurological: alert and oriented to person, place, and time.  Skin: Skin is warm. No rash noted. non diaphoretic.  Psychiatric: normal mood and affect. behavior is normal. Judgment and thought content normal.   ED Course  Procedures (including critical care time)  Labs Review Labs Reviewed - No data to display  Imaging Review Dg Shoulder Left  10/10/2015  CLINICAL DATA:  Fall, left shoulder pain/injury EXAM: LEFT SHOULDER - 2+ VIEW COMPARISON:  None. FINDINGS: No fracture or dislocation is seen. The joint spaces are preserved. Visualized soft tissues are within normal limits. Visualized left lung is clear. IMPRESSION: No fracture or dislocation is seen. Electronically Signed   By: Charline BillsSriyesh  Krishnan M.D.   On: 10/10/2015 17:24     Visual Acuity Review  Right Eye Distance:   Left Eye Distance:   Bilateral Distance:  Right Eye Near:   Left Eye Near:    Bilateral Near:         MDM   1. Acute shoulder pain due to trauma, left    Toradol 60 mg IM given. Plain film on unremarkable for fracture. Suspect patient may have potentially dislocated her shoulder briefly versus caused a rotator cuff strain.  Continue  anti-inflammatories, ice, range of motion exercises. Exercises emphasizes being very important in order to prevent frozen shoulder. Patient follow-up with sports medicine orthopedics if not improving.    Jehan Ranganathan J MOzella Rocks0/28/16 [phone]

## 2015-10-10 NOTE — ED Notes (Signed)
Patient states she feel backwards yesterday on to a hard floor Injuring her left shoulder Patient stated she her her shoulder "pop" when she fell Also stated when she woke up today her left hand was swollen as well

## 2015-10-10 NOTE — Discharge Instructions (Signed)
There is no evidence of permanent injury to your shoulder. He very likely may have subluxed her shoulder but put back in place prior to evaluation. Your x-rays are negative for fracture or other significant injury. Her given a shot of Toradol to help relieve your pain and inflammation. Please start the Voltaren in 24 hours. Please apply ice for 30 minutes at a time multiple times per day. Please remember to perform range of motion exercises at least twice daily to prevent frozen shoulder. Please follow-up with orthopedic surgery or sports medicine if your symptoms get worse.

## 2015-11-27 ENCOUNTER — Encounter (HOSPITAL_COMMUNITY): Payer: Self-pay | Admitting: Nurse Practitioner

## 2015-11-27 ENCOUNTER — Emergency Department (HOSPITAL_COMMUNITY)
Admission: EM | Admit: 2015-11-27 | Discharge: 2015-11-27 | Disposition: A | Discharge status: Home / Self Care | Payer: Medicaid Other | Attending: Emergency Medicine | Admitting: Emergency Medicine

## 2015-11-27 DIAGNOSIS — R319 Hematuria, unspecified: Secondary | ICD-10-CM

## 2015-11-27 DIAGNOSIS — N3090 Cystitis, unspecified without hematuria: Secondary | ICD-10-CM

## 2015-11-27 DIAGNOSIS — Z793 Long term (current) use of hormonal contraceptives: Secondary | ICD-10-CM | POA: Insufficient documentation

## 2015-11-27 DIAGNOSIS — Z3202 Encounter for pregnancy test, result negative: Secondary | ICD-10-CM | POA: Insufficient documentation

## 2015-11-27 LAB — URINALYSIS, ROUTINE W REFLEX MICROSCOPIC
BILIRUBIN URINE: NEGATIVE
GLUCOSE, UA: NEGATIVE mg/dL
KETONES UR: NEGATIVE mg/dL
Nitrite: POSITIVE — AB
PH: 5.5 (ref 5.0–8.0)
Protein, ur: 100 mg/dL — AB
Specific Gravity, Urine: 1.025 (ref 1.005–1.030)

## 2015-11-27 LAB — PREGNANCY, URINE: Preg Test, Ur: NEGATIVE

## 2015-11-27 LAB — URINE MICROSCOPIC-ADD ON: Bacteria, UA: NONE SEEN

## 2015-11-27 MED ORDER — CEPHALEXIN 500 MG PO CAPS: 500.0000 mg | ORAL_CAPSULE | Freq: Four times a day (QID) | ORAL | Status: DC

## 2015-11-27 NOTE — Discharge Instructions (Signed)
Stay very well hydrated with plenty of water throughout the day. Please take antibiotic until completion. Follow up with primary care physician in 1 week for recheck of ongoing symptoms. ° °Please seek immediate care if you develop the following: °Your symptoms are no better or worse in 3 days. °There is severe back pain or lower abdominal pain.  °You develop chills.  °You have a fever.  °There is nausea or vomiting.  °There is continued burning or discomfort with urination.  ° °

## 2015-11-27 NOTE — ED Provider Notes (Signed)
CSN: [id number]     Arrival date & time 11/27/15  [id number] History   First MD Initiated Contact with Patient 11/27/15 [phone]     Chief Complaint  Patient presents with  . Hematuria     (Consider location/radiation/quality/duration/timing/severity/associated sxs/prior Treatment) HPI  Jennifer Lynn is a 19 y.o. female  who presents to the Emergency Department complaining of urgency, frequency x 1 week with hematuria and dysuria beginning this morning. No history of similar sxs. No medications taken prior to arrival. Denies other associated symptoms. Admits to recent unprotected intercourse. Denies fever, vaginal discharge/irritation.    History reviewed. No pertinent past medical history. History reviewed. No pertinent past surgical history. Family History  Problem Relation Age of Onset  . Diabetes Maternal Grandmother   . Diabetes Paternal Grandmother    Social History  Substance Use Topics  . Smoking status: Never Smoker   . Smokeless tobacco: None  . Alcohol Use: No   OB History    No data available     Review of Systems  Constitutional: Negative.   HENT: Negative for congestion, rhinorrhea and sore throat.   Eyes: Negative for visual disturbance.  Respiratory: Negative for cough, shortness of breath and wheezing.   Cardiovascular: Negative.   Gastrointestinal: Negative for nausea, vomiting, abdominal pain, diarrhea and constipation.  Genitourinary: Positive for dysuria, urgency, frequency and decreased urine volume.  Musculoskeletal: Negative for myalgias, back pain, arthralgias and neck pain.  Skin: Negative for rash.  Neurological: Negative for dizziness, weakness and headaches.      Allergies  Review of patient's allergies indicates no known allergies.  Home Medications   Prior to Admission medications   Medication Sig Start Date End Date Taking? Authorizing Provider  cephALEXin (KEFLEX) 500 MG capsule Take 1 capsule (500 mg total) by mouth 4 (four) times  daily. 11/27/15   Chase PicketJaime Pilcher Vern Guerette, PA-C  diclofenac (VOLTAREN) 75 MG EC tablet Take 1 tablet (75 mg total) by mouth 2 (two) times daily. Patient not taking: Reported on 11/27/2015 10/10/15   Ozella Rocksavid J Merrell, MD  norgestimate-ethinyl estradiol (ORTHO-CYCLEN,SPRINTEC,PREVIFEM) 0.25-35 MG-MCG tablet Take 1 tablet by mouth daily. Patient not taking: Reported on 11/27/2015 04/09/15   Ambrose FinlandValerie A Keck, NP   BP 131/98 mmHg  Pulse 72  Temp(Src) 99.1 F (37.3 C) (Oral)  Resp 16  Ht 5\' 3"  (1.6 m)  Wt 108.863 kg  BMI 42.52 kg/m2  SpO2 99%  LMP 11/07/2015 Physical Exam  Constitutional: She is oriented to person, place, and time. She appears well-developed and well-nourished.  Alert and in no acute distress  HENT:  Head: Normocephalic and atraumatic.  Cardiovascular: Normal rate, regular rhythm, normal heart sounds and intact distal pulses.  Exam reveals no gallop and no friction rub.   No murmur heard. Pulmonary/Chest: Effort normal and breath sounds normal. No respiratory distress. She has no wheezes. She has no rales. She exhibits no tenderness.  Abdominal: She exhibits no mass. There is no rebound and no guarding.  Abdomen soft, non-tender, non-distended Bowel sounds positive in all four quadrants  Musculoskeletal: She exhibits no edema.  No CVA tenderness  Neurological: She is alert and oriented to person, place, and time.  Skin: Skin is warm and dry. No rash noted.  Psychiatric: She has a normal mood and affect. Her behavior is normal. Judgment and thought content normal.  Nursing note and vitals reviewed.   ED Course  Procedures (including critical care time) Labs Review Labs Reviewed  URINALYSIS, ROUTINE W REFLEX MICROSCOPIC (NOT AT  [facility]) - Abnormal; Notable for the following:    APPearance TURBID (*)    Hgb urine dipstick LARGE (*)    Protein, ur 100 (*)    Nitrite POSITIVE (*)    Leukocytes, UA MODERATE (*)    All other components within normal limits  URINE MICROSCOPIC-ADD  ON - Abnormal; Notable for the following:    Squamous Epithelial / LPF 6-30 (*)    All other components within normal limits  PREGNANCY, URINE    Imaging Review No results found. I have personally reviewed and evaluated these images and lab results as part of my medical decision-making.   EKG Interpretation None      MDM   Final diagnoses:  Cystitis  Hematuria   Jennifer Lynn is a healthy 19 year old female who presents with urinary symptoms.  Labs: UA shows + nitrite, + leuks with too numerous to count WBC.   A&P: UTI   - Keflex  - Follow up with PCP   Chase Picket Nylee Barbuto, PA-C 11/27/15 1106  Tilden Fossa, MD 11/27/15 [phone]

## 2015-11-27 NOTE — ED Notes (Signed)
Pt endorses hematuria and dysuria starting today. Pt endorses frequency and oliguria for hte past few days. Pt endorses recent unprotected sex- denies vaginal discharge, bleeding, irritation or pain.

## 2016-03-25 ENCOUNTER — Emergency Department (HOSPITAL_COMMUNITY)
Admission: EM | Admit: 2016-03-25 | Discharge: 2016-03-25 | Disposition: A | Discharge status: Home / Self Care | Payer: BLUE CROSS/BLUE SHIELD | Attending: Emergency Medicine | Admitting: Emergency Medicine

## 2016-03-25 DIAGNOSIS — Z3202 Encounter for pregnancy test, result negative: Secondary | ICD-10-CM | POA: Diagnosis not present

## 2016-03-25 DIAGNOSIS — R112 Nausea with vomiting, unspecified: Secondary | ICD-10-CM | POA: Diagnosis not present

## 2016-03-25 DIAGNOSIS — R197 Diarrhea, unspecified: Secondary | ICD-10-CM | POA: Diagnosis not present

## 2016-03-25 DIAGNOSIS — R109 Unspecified abdominal pain: Secondary | ICD-10-CM | POA: Diagnosis present

## 2016-03-25 LAB — COMPREHENSIVE METABOLIC PANEL
ALBUMIN: 4.1 g/dL (ref 3.5–5.0)
ALT: 15 U/L (ref 14–54)
AST: 17 U/L (ref 15–41)
Alkaline Phosphatase: 68 U/L (ref 38–126)
Anion gap: 12 (ref 5–15)
BILIRUBIN TOTAL: 0.7 mg/dL (ref 0.3–1.2)
BUN: 12 mg/dL (ref 6–20)
CO2: 24 mmol/L (ref 22–32)
CREATININE: 0.83 mg/dL (ref 0.44–1.00)
Calcium: 9.5 mg/dL (ref 8.9–10.3)
Chloride: 106 mmol/L (ref 101–111)
GFR calc Af Amer: >60 mL/min (ref >60)
GLUCOSE: 106 mg/dL — AB (ref 65–99)
POTASSIUM: 4 mmol/L (ref 3.5–5.1)
Sodium: 142 mmol/L (ref 135–145)
TOTAL PROTEIN: 7.7 g/dL (ref 6.5–8.1)

## 2016-03-25 LAB — URINALYSIS, ROUTINE W REFLEX MICROSCOPIC
BILIRUBIN URINE: NEGATIVE
GLUCOSE, UA: NEGATIVE mg/dL
Hgb urine dipstick: NEGATIVE
KETONES UR: 40 mg/dL — AB
LEUKOCYTES UA: NEGATIVE
NITRITE: NEGATIVE
PH: 6 (ref 5.0–8.0)
PROTEIN: NEGATIVE mg/dL
Specific Gravity, Urine: 1.027 (ref 1.005–1.030)

## 2016-03-25 LAB — I-STAT BETA HCG BLOOD, ED (MC, WL, AP ONLY): I-stat hCG, quantitative: <5 m[IU]/mL (ref <5)

## 2016-03-25 LAB — CBC
HEMATOCRIT: 38.6 % (ref 36.0–46.0)
Hemoglobin: 12.5 g/dL (ref 12.0–15.0)
MCH: 26.4 pg (ref 26.0–34.0)
MCHC: 32.4 g/dL (ref 30.0–36.0)
MCV: 81.4 fL (ref 78.0–100.0)
PLATELETS: 296 10*3/uL (ref 150–400)
RBC: 4.74 MIL/uL (ref 3.87–5.11)
RDW: 14 % (ref 11.5–15.5)
WBC: 6.6 10*3/uL (ref 4.0–10.5)

## 2016-03-25 LAB — LIPASE, BLOOD: Lipase: 25 U/L (ref 11–51)

## 2016-03-25 MED ORDER — SODIUM CHLORIDE 0.9 % IV BOLUS (SEPSIS)
1000.0000 mL | Freq: Once | INTRAVENOUS | Status: AC
  Administered 2016-03-25: 1000 mL via INTRAVENOUS

## 2016-03-25 MED ORDER — DICYCLOMINE HCL 20 MG PO TABS
20.0000 mg | ORAL_TABLET | Freq: Once | ORAL | Status: AC
  Administered 2016-03-25: 20 mg via ORAL
  Filled 2016-03-25: qty 1

## 2016-03-25 MED ORDER — ONDANSETRON HCL 4 MG/2ML IJ SOLN
4.0000 mg | Freq: Once | INTRAMUSCULAR | Status: DC
  Filled 2016-03-25: qty 2

## 2016-03-25 MED ORDER — DICYCLOMINE HCL 20 MG PO TABS: 20.0000 mg | ORAL_TABLET | Freq: Two times a day (BID) | ORAL | Status: DC

## 2016-03-25 MED ORDER — DIPHENOXYLATE-ATROPINE 2.5-0.025 MG PO TABS: 1.0000 | ORAL_TABLET | Freq: Four times a day (QID) | ORAL | Status: DC | PRN

## 2016-03-25 MED ORDER — ONDANSETRON 4 MG PO TBDP
4.0000 mg | ORAL_TABLET | Freq: Once | ORAL | Status: AC
  Administered 2016-03-25: 4 mg via ORAL
  Filled 2016-03-25: qty 1

## 2016-03-25 MED ORDER — DIPHENHYDRAMINE HCL 50 MG/ML IJ SOLN
25.0000 mg | Freq: Once | INTRAMUSCULAR | Status: AC
  Administered 2016-03-25: 25 mg via INTRAVENOUS
  Filled 2016-03-25: qty 1

## 2016-03-25 MED ORDER — PROCHLORPERAZINE EDISYLATE 5 MG/ML IJ SOLN
5.0000 mg | Freq: Once | INTRAMUSCULAR | Status: AC
  Administered 2016-03-25: 5 mg via INTRAMUSCULAR
  Filled 2016-03-25: qty 2

## 2016-03-25 MED ORDER — ONDANSETRON HCL 4 MG PO TABS: 4.0000 mg | ORAL_TABLET | Freq: Three times a day (TID) | ORAL | Status: DC | PRN

## 2016-03-25 NOTE — ED Notes (Signed)
N/v/d since last night.  No abdominal pain with this.

## 2016-03-25 NOTE — Discharge Instructions (Signed)

## 2016-03-25 NOTE — ED Provider Notes (Signed)
CSN: [id number]     Arrival date & time 03/25/16  [id number] History   First MD Initiated Contact with Patient 03/25/16 1200     Chief Complaint  Patient presents with  . Abdominal Cramping     (Consider location/radiation/quality/duration/timing/severity/associated sxs/prior Treatment) HPI   20 year old female who presents emergency Department with chief complaint of nausea, vomiting, diarrhea and abdominal cramping. Onset of symptoms last evening. Patient states she was sleeping and awoke suddenly with cramping, diarrhea and vomiting. She had multiple episodes of watery brown stool and nonbloody, nonbilious vomitus. She denies severe or localized abdominal pain. She states she has cramping just before she is about to have diarrhea. No known contacts with similar symptoms. No recent foreign travel. Last menstrual period was 03/24/2016 No past medical history on file. No past surgical history on file. Family History  Problem Relation Age of Onset  . Diabetes Maternal Grandmother   . Diabetes Paternal Grandmother    Social History  Substance Use Topics  . Smoking status: Never Smoker   . Smokeless tobacco: Not on file  . Alcohol Use: No   OB History    No data available     Review of Systems  Ten systems reviewed and are negative for acute change, except as noted in the HPI.    Allergies  Review of patient's allergies indicates no known allergies.  Home Medications   Prior to Admission medications   Medication Sig Start Date End Date Taking? Authorizing Provider  dicyclomine (BENTYL) 20 MG tablet Take 1 tablet (20 mg total) by mouth 2 (two) times daily. 03/25/16   Arthor CaptainAbigail Rodriguez Aguinaldo, PA-C  diphenoxylate-atropine (LOMOTIL) 2.5-0.025 MG tablet Take 1 tablet by mouth 4 (four) times daily as needed for diarrhea or loose stools. 03/25/16   Arthor CaptainAbigail Ford Peddie, PA-C  ondansetron (ZOFRAN) 4 MG tablet Take 1 tablet (4 mg total) by mouth every 8 (eight) hours as needed for nausea or vomiting.  03/25/16   Arthor CaptainAbigail Jesalyn Finazzo, PA-C   BP 107/53 mmHg  Pulse 90  Temp(Src) 98.6 F (37 C) (Oral)  Resp 14  Ht 5\' 2"  (1.575 m)  Wt 108.863 kg  BMI 43.89 kg/m2  SpO2 100% Physical Exam Physical Exam  Nursing note and vitals reviewed. Constitutional: She is oriented to person, place, and time. She appears well-developed and well-nourished. No distress.  HENT:  Head: Normocephalic and atraumatic.  Eyes: Conjunctivae normal and EOM are normal. Pupils are equal, round, and reactive to light. No scleral icterus.  Neck: Normal range of motion.  Cardiovascular: Normal rate, regular rhythm and normal heart sounds.  Exam reveals no gallop and no friction rub.   No murmur heard. Pulmonary/Chest: Effort normal and breath sounds normal. No respiratory distress.  Abdominal: Soft. Bowel sounds are normal. She exhibits no distension and no mass. There is no tenderness. There is no guarding.  Neurological: She is alert and oriented to person, place, and time.  Skin: Skin is warm and dry. She is not diaphoretic.    ED Course  Procedures (including critical care time) Labs Review Labs Reviewed  COMPREHENSIVE METABOLIC PANEL - Abnormal; Notable for the following:    Glucose, Bld 106 (*)    All other components within normal limits  URINALYSIS, ROUTINE W REFLEX MICROSCOPIC (NOT [facility]) - Abnormal; Notable for the following:    Ketones, ur 40 (*)    All other components within normal limits  LIPASE, BLOOD  CBC  I-STAT BETA HCG BLOOD, ED ([facility], [facility], [facility] ONLY)  Imaging Review No results found. I have personally reviewed and evaluated these images and lab results as part of my medical decision-making.   EKG Interpretation None      MDM   Final diagnoses:  Nausea vomiting and diarrhea  Abdominal cramping    Patient with symptoms consistent with viral gastroenteritis.  Vitals are stable, no fever.  No signs of dehydration, tolerating PO fluids > 6 oz.  Lungs are clear.  No focal abdominal  pain, no concern for appendicitis, cholecystitis, pancreatitis, ruptured viscus, UTI, kidney stone, or any other abdominal etiology.  Supportive therapy indicated with return if symptoms worsen.  Patient counseled.     Arthor Captain, PA-C 03/25/16 1730  Glynn Octave, MD 03/25/16 [phone]

## 2016-03-25 NOTE — ED Notes (Signed)
Pt is sleeping, when awakened to give po med and sips of ginger ale she tells me that she feels better

## 2016-03-25 NOTE — ED Notes (Signed)
Pt ambulates to the bathroom to attempt to get urine sample.  Pt declines IV at this time

## 2017-01-07 ENCOUNTER — Ambulatory Visit: Payer: Self-pay | Attending: Family Medicine | Admitting: Family Medicine

## 2017-01-07 ENCOUNTER — Encounter: Payer: Self-pay | Admitting: Family Medicine

## 2017-01-07 VITALS — BP 118/81 | HR 94 | Temp 98.5°F | Resp 18 | Ht 63.0 in | Wt 241.4 lb

## 2017-01-07 DIAGNOSIS — Z79899 Other long term (current) drug therapy: Secondary | ICD-10-CM | POA: Insufficient documentation

## 2017-01-07 DIAGNOSIS — Z6841 Body Mass Index (BMI) 40.0 and over, adult: Secondary | ICD-10-CM | POA: Insufficient documentation

## 2017-01-07 DIAGNOSIS — Z713 Dietary counseling and surveillance: Secondary | ICD-10-CM
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
## 2017-01-07 NOTE — Patient Instructions (Signed)
Preventing Complications From Unhealthy Weight Loss Behaviors, Adult Reaching and maintaining a healthy weight is important for your overall health. It is natural to want to lose weight quickly, using whatever methods seem fastest. However, losing weight in a healthy way is not a quick process. Instead, aim for slow, steady weight loss. What lifestyle changes can be made? You can make certain lifestyle changes to help you lose weight in a healthy way. These include eating nutritious foods and exercising regularly. What to avoid:  Following a diet that restricts entire types of food.  Skipping meals to save calories. It is especially important to eat breakfast.  Not eating anything for long periods of time (fasting).  Restricting your calories to far less than the amount that you need to lose or maintain a healthy weight.  Compulsively getting an extreme amount of exercise.  Taking laxative pills to make you have more frequent bowel movements.  Taking medicines to make your body lose excess fluids (diuretics).  Eating an excessive amount of food (bingeing), then making yourself vomit (purging). Healthy behaviors:   Eat a variety of healthy foods, including:  Fruits and vegetables.  Whole grains.  Lean proteins.  Low-fat dairy products.  Drink water instead of sugary drinks.  Plan healthy, low-calorie meals. Work with a Dealer (dietitian) to make a healthy meal plan that works for you and to make an exercise program.  Include different types of exercise in your exercise program, such as strengthening, aerobic, and flexibility exercises.  To maintain your weight, get at least 150 minutes of moderate-intensity exercise every week. Moderate-intensity exercise could be brisk walking or biking.  To lose a healthy amount of weight, get 60 minutes of moderate-intensity exercise each day.  Find ways to reduce stress, such as regular exercise or meditation.  Find a  hobby or other activity that you enjoy to distract you from eating when you feel stressed or bored. Why are these changes important? Making these changes will improve your overall health. Maintaining a healthy weight also lowers your risk of certain conditions, including:  Heart disease.  High cholesterol.  High blood pressure.  Type 2 diabetes.  Stroke.  Osteoarthritis.  Osteoporosis.  Some types of cancer.  Breathing and sleeping disorders. What can happen if changes are not made? Using disordered eating or other unhealthy eating behaviors to try to lose weight can cause:  Fatigue.  Imbalances in electrolytes and chemicals that your body needs to work properly.  Organ damage or failure, especially of the kidneys.  Dehydration.  Imbalances in body fluids.  Low heart rate and blood pressure.  Thin bones that break easily.  Social isolation or relationship problems with your friends and family.  Emotional distress, including depression and anxiety.  A greater risk of an eating disorder. If you develop an eating disorder, you could develop serious health problems and complications that affect yourorgans and bodily processes. These include:  Dry skin and hair.  Hair loss.  Fainting.  Difficulty getting pregnant.  Changes in your heart muscle and the way your heart works.  Severe dehydration.  Damage to the digestive tract and digestive problems.  Long-term (chronic) gastrointestinal problems.  High blood pressure.  High cholesterol.  Heart disease.  Type 2 diabetes. Where to find support: For more support, talk with:  Your health care provider or dietitian. Ask about support groups.  A mental health care provider.  Family and friends. Where to find more information: Learn more about how to prevent complications  from unhealthy weight loss behaviors from:  Centers for Disease Control and Prevention:  https://harper.com/  [employer] of Mental Health: InsuranceStats.ca.shtml  National Eating Disorders Association: www.nationaleatingdisorders.org Contact a health care provider if:  You often feel very tired.  You notice changes in your skin or your hair.  You faint because of dehydration or too much exercise.  You struggle to change your unhealthy weight loss behaviors on your own.  Unhealthy weight loss behaviors are affecting your daily life.  You have signs or symptoms of an eating disorder.  You have major weight changes in a short period of time.  You feel guilty or ashamed about eating or exercising.  You have trouble with your relationships because of your weight loss habits. Summary  Aim for slow, steady weight loss by choosing healthy foods, getting enough calories every day, and exercising regularly.  If you cannot make these changes by yourself, or if you think that you may have an eating disorder, contact your health care provider. This information is not intended to replace advice given to you by your health care provider. Make sure you discuss any questions you have with your health care provider. Document Released: 12/14/2015 Document Revised: 06/18/2016 Document Reviewed: 12/14/2015 Elsevier Interactive Patient Education  2017 [employer]. Exercising to [employer] Introduction Exercising can help you to lose weight. In order to lose weight through exercise, you need to do vigorous-intensity exercise. You can tell that you are exercising with vigorous intensity if you are breathing very hard and fast and cannot hold a conversation while exercising. Moderate-intensity exercise helps to maintain your current weight. You can tell that you are exercising at a moderate level if you have a higher heart rate and faster breathing, but you are still able to hold a  conversation. How often should I exercise? Choose an activity that you enjoy and set realistic goals. Your health care provider can help you to make an activity plan that works for you. Exercise regularly as directed by your health care provider. This may include:  Doing resistance training twice each week, such as:  Push-ups.  Sit-ups.  Lifting weights.  Using resistance bands.  Doing a given intensity of exercise for a given amount of time. Choose from these options:  150 minutes of moderate-intensity exercise every week.  75 minutes of vigorous-intensity exercise every week.  A mix of moderate-intensity and vigorous-intensity exercise every week. Children, pregnant women, people who are out of shape, people who are overweight, and older adults may need to consult a health care provider for individual recommendations. If you have any sort of medical condition, be sure to consult your health care provider before starting a new exercise program. What are some activities that can help me to lose weight?  Walking at a rate of at least 4.5 miles an hour.  Jogging or running at a rate of 5 miles per hour.  Biking at a rate of at least 10 miles per hour.  Lap swimming.  Roller-skating or in-line skating.  Cross-country skiing.  Vigorous competitive sports, such as football, basketball, and soccer.  Jumping rope.  Aerobic dancing. How can I be more active in my day-to-day activities?  Use the stairs instead of the elevator.  Take a walk during your lunch break.  If you drive, park your car farther away from work or school.  If you take public transportation, get off one stop early and walk the rest of the way.  Make all of your phone  calls while standing up and walking around.  Get up, stretch, and walk around every 30 minutes throughout the day. What guidelines should I follow while exercising?  Do not exercise so much that you hurt yourself, feel dizzy, or get very  short of breath.  Consult your health care provider prior to starting a new exercise program.  Wear comfortable clothes and shoes with good support.  Drink plenty of water while you exercise to prevent dehydration or heat stroke. Body water is lost during exercise and must be replaced.  Work out until you breathe faster and your heart beats faster. This information is not intended to replace advice given to you by your health care provider. Make sure you discuss any questions you have with your health care provider. Document Released: 01/01/2011 Document Revised: 05/06/2016 Document Reviewed: 05/02/2014  2017 Elsevier  Calorie Counting for Weight Loss Calories are energy you get from the things you eat and drink. Your body uses this energy to keep you going throughout the day. The number of calories you eat affects your weight. When you eat more calories than your body needs, your body stores the extra calories as fat. When you eat fewer calories than your body needs, your body burns fat to get the energy it needs. Calorie counting means keeping track of how many calories you eat and drink each day. If you make sure to eat fewer calories than your body needs, you should lose weight. In order for calorie counting to work, you will need to eat the number of calories that are right for you in a day to lose a healthy amount of weight per week. A healthy amount of weight to lose per week is usually 1-2 lb (0.5-0.9 kg). A dietitian can determine how many calories you need in a day and give you suggestions on how to reach your calorie goal.  WHAT IS MY MY PLAN? My goal is to have __________ calories per day.  If I have this many calories per day, I should lose around __________ pounds per week. WHAT DO I NEED TO KNOW ABOUT CALORIE COUNTING? In order to meet your daily calorie goal, you will need to:  Find out how many calories are in each food you would like to eat. Try to do this before you  eat.  Decide how much of the food you can eat.  Write down what you ate and how many calories it had. Doing this is called keeping a food log. WHERE DO I FIND CALORIE INFORMATION? The number of calories in a food can be found on a Nutrition Facts label. Note that all the information on a label is based on a specific serving of the food. If a food does not have a Nutrition Facts label, try to look up the calories online or ask your dietitian for help. HOW DO I DECIDE HOW MUCH TO EAT? To decide how much of the food you can eat, you will need to consider both the number of calories in one serving and the size of one serving. This information can be found on the Nutrition Facts label. If a food does not have a Nutrition Facts label, look up the information online or ask your dietitian for help. Remember that calories are listed per serving. If you choose to have more than one serving of a food, you will have to multiply the calories per serving by the amount of servings you plan to eat. For example, the label on a  package of bread might say that a serving size is 1 slice and that there are 90 calories in a serving. If you eat 1 slice, you will have eaten 90 calories. If you eat 2 slices, you will have eaten 180 calories. HOW DO I KEEP A FOOD LOG? After each meal, record the following information in your food log:  What you ate.  How much of it you ate.  How many calories it had.  Then, add up your calories. Keep your food log near you, such as in a small notebook in your pocket. Another option is to use a mobile app or website. Some programs will calculate calories for you and show you how many calories you have left each time you add an item to the log. WHAT ARE SOME CALORIE COUNTING TIPS?  Use your calories on foods and drinks that will fill you up and not leave you hungry. Some examples of this include foods like nuts and nut butters, vegetables, lean proteins, and high-fiber foods (more than 5  g fiber per serving).  Eat nutritious foods and avoid empty calories. Empty calories are calories you get from foods or beverages that do not have many nutrients, such as candy and soda. It is better to have a nutritious high-calorie food (such as an avocado) than a food with few nutrients (such as a bag of chips).  Know how many calories are in the foods you eat most often. This way, you do not have to look up how many calories they have each time you eat them.  Look out for foods that may seem like low-calorie foods but are really high-calorie foods, such as baked goods, soda, and fat-free candy.  Pay attention to calories in drinks. Drinks such as sodas, specialty coffee drinks, alcohol, and juices have a lot of calories yet do not fill you up. Choose low-calorie drinks like water and diet drinks.  Focus your calorie counting efforts on higher calorie items. Logging the calories in a garden salad that contains only vegetables is less important than calculating the calories in a milk shake.  Find a way of tracking calories that works for you. Get creative. Most people who are successful find ways to keep track of how much they eat in a day, even if they do not count every calorie. WHAT ARE SOME PORTION CONTROL TIPS?  Know how many calories are in a serving. This will help you know how many servings of a certain food you can have.  Use a measuring cup to measure serving sizes. This is helpful when you start out. With time, you will be able to estimate serving sizes for some foods.  Take some time to put servings of different foods on your favorite plates, bowls, and cups so you know what a serving looks like.  Try not to eat straight from a bag or box. Doing this can lead to overeating. Put the amount you would like to eat in a cup or on a plate to make sure you are eating the right portion.  Use smaller plates, glasses, and bowls to prevent overeating. This is a quick and easy way to  practice portion control. If your plate is smaller, less food can fit on it.  Try not to multitask while eating, such as watching TV or using your computer. If it is time to eat, sit down at a table and enjoy your food. Doing this will help you to start recognizing when you are full.  It will also make you more aware of what and how much you are eating. HOW CAN I CALORIE COUNT WHEN EATING OUT?  Ask for smaller portion sizes or child-sized portions.  Consider sharing an entree and sides instead of getting your own entree.  If you get your own entree, eat only half. Ask for a box at the beginning of your meal and put the rest of your entree in it so you are not tempted to eat it.  Look for the calories on the menu. If calories are listed, choose the lower calorie options.  Choose dishes that include vegetables, fruits, whole grains, low-fat dairy products, and lean protein. Focusing on smart food choices from each of the 5 food groups can help you stay on track at restaurants.  Choose items that are boiled, broiled, grilled, or steamed.  Choose water, milk, unsweetened iced tea, or other drinks without added sugars. If you want an alcoholic beverage, choose a lower calorie option. For example, a regular margarita can have up to 700 calories and a glass of wine has around 150.  Stay away from items that are buttered, battered, fried, or served with cream sauce. Items labeled "crispy" are usually fried, unless stated otherwise.  Ask for dressings, sauces, and syrups on the side. These are usually very high in calories, so do not eat much of them.  Watch out for salads. Many people think salads are a healthy option, but this is often not the case. Many salads come with bacon, fried chicken, lots of cheese, fried chips, and dressing. All of these items have a lot of calories. If you want a salad, choose a garden salad and ask for grilled meats or steak. Ask for the dressing on the side, or ask for  olive oil and vinegar or lemon to use as dressing.  Estimate how many servings of a food you are given. For example, a serving of cooked rice is  cup or about the size of half a tennis ball or one cupcake wrapper. Knowing serving sizes will help you be aware of how much food you are eating at restaurants. The list below tells you how big or small some common portion sizes are based on everyday objects.  1 oz-4 stacked dice.  3 oz-1 deck of cards.  1 tsp-1 dice.  1 Tbsp- a Ping-Pong ball.  2 Tbsp-1 Ping-Pong ball.   cup-1 tennis ball or 1 cupcake wrapper.  1 cup-1 baseball. This information is not intended to replace advice given to you by your health care provider. Make sure you discuss any questions you have with your health care provider. Document Released: 11/29/2005 Document Revised: 12/20/2014 Document Reviewed: 10/04/2013 Elsevier Interactive Patient Education  2017 [employer]

## 2017-01-07 NOTE — Progress Notes (Signed)
Patient is here for reestablish care  Patient is here for weight loss medication  Patient declined the flu shot today

## 2017-01-18 ENCOUNTER — Telehealth: Payer: Self-pay | Admitting: Family Medicine

## 2017-01-18 NOTE — Telephone Encounter (Signed)
CMA call to inform her that she will need an office visit   Patient did not answer left a VM saying that she will need an office visit first

## 2017-01-18 NOTE — Telephone Encounter (Signed)
Patient returned call and was advised per note that she would need to schedule an appt. To be referred to OBGYN per her PCP, she stated she does not want to be seen by her current pcp because nothing was done during her visit and if she comes in again she will be billed unnecessarily. She stated her insurance only covers obgyn.  Patient became irate and ended call

## 2017-01-18 NOTE — Telephone Encounter (Signed)
Patient called and would like to have a referral to an OB/GYN. Please advice.

## 2017-01-18 NOTE — Telephone Encounter (Signed)
Patient called and would like to have a referral to an OB/GYN. Advised patient that I would send note to nurse to f/u.

## 2017-01-18 NOTE — Telephone Encounter (Signed)
Did patient say why she is requesting a referral to ob/gyn? Please ask, she will also need an office visit first.

## 2017-02-04 ENCOUNTER — Ambulatory Visit: Payer: Medicaid Other | Attending: Family Medicine | Admitting: Family Medicine

## 2017-02-04 ENCOUNTER — Encounter: Payer: Self-pay | Admitting: Family Medicine

## 2017-02-04 VITALS — BP 125/78 | HR 73 | Temp 98.6°F | Resp 18 | Ht 63.0 in | Wt 232.4 lb

## 2017-02-04 DIAGNOSIS — R5383 Other fatigue: Secondary | ICD-10-CM | POA: Insufficient documentation

## 2017-02-04 DIAGNOSIS — R110 Nausea: Secondary | ICD-10-CM

## 2017-02-04 DIAGNOSIS — R634 Abnormal weight loss: Secondary | ICD-10-CM | POA: Diagnosis not present

## 2017-02-04 DIAGNOSIS — Z6841 Body Mass Index (BMI) 40.0 and over, adult: Secondary | ICD-10-CM | POA: Insufficient documentation

## 2017-02-04 DIAGNOSIS — Z3201 Encounter for pregnancy test, result positive: Secondary | ICD-10-CM | POA: Diagnosis not present

## 2017-02-04 LAB — POCT URINE PREGNANCY: PREG TEST UR: POSITIVE — AB

## 2017-02-04 MED ORDER — ONDANSETRON HCL 4 MG PO TABS: 4.0000 mg | ORAL_TABLET | Freq: Three times a day (TID) | ORAL | 0 refills | Status: DC | PRN

## 2017-02-04 MED ORDER — PRENATAL/FOLIC ACID PO TABS
1.0000 | ORAL_TABLET | Freq: Every day | ORAL | 11 refills | Status: DC
Start: 2017-02-04 — End: 2017-04-06

## 2017-02-04 NOTE — Patient Instructions (Addendum)
Ondansetron tablets What is this medicine? ONDANSETRON (on DAN se tron) is used to treat nausea and vomiting caused by chemotherapy. It is also used to prevent or treat nausea and vomiting after surgery. This medicine may be used for other purposes; ask your health care provider or pharmacist if you have questions. COMMON BRAND NAME(S): Zofran What should I tell my health care provider before I take this medicine? They need to know if you have any of these conditions: -heart disease -history of irregular heartbeat -liver disease -low levels of magnesium or potassium in the blood -an unusual or allergic reaction to ondansetron, granisetron, other medicines, foods, dyes, or preservatives -pregnant or trying to get pregnant -breast-feeding How should I use this medicine? Take this medicine by mouth with a glass of water. Follow the directions on your prescription label. Take your doses at regular intervals. Do not take your medicine more often than directed. Talk to your pediatrician regarding the use of this medicine in children. Special care may be needed. Overdosage: If you think you have taken too much of this medicine contact a poison control center or emergency room at once. NOTE: This medicine is only for you. Do not share this medicine with others. What if I miss a dose? If you miss a dose, take it as soon as you can. If it is almost time for your next dose, take only that dose. Do not take double or extra doses. What may interact with this medicine? Do not take this medicine with any of the following medications: -apomorphine -certain medicines for fungal infections like fluconazole, itraconazole, ketoconazole, posaconazole, voriconazole -cisapride -dofetilide -dronedarone -pimozide -thioridazine -ziprasidone This medicine may also interact with the following medications: -carbamazepine -certain medicines for depression, anxiety, or psychotic  disturbances -fentanyl -linezolid -MAOIs like Carbex, Eldepryl, Marplan, Nardil, and Parnate -methylene blue (injected into a vein) -other medicines that prolong the QT interval (cause an abnormal heart rhythm) -phenytoin -rifampicin -tramadol This list may not describe all possible interactions. Give your health care provider a list of all the medicines, herbs, non-prescription drugs, or dietary supplements you use. Also tell them if you smoke, drink alcohol, or use illegal drugs. Some items may interact with your medicine. What should I watch for while using this medicine? Check with your doctor or health care professional right away if you have any sign of an allergic reaction. What side effects may I notice from receiving this medicine? Side effects that you should report to your doctor or health care professional as soon as possible: -allergic reactions like skin rash, itching or hives, swelling of the face, lips or tongue -breathing problems -confusion -dizziness -fast or irregular heartbeat -feeling faint or lightheaded, falls -fever and chills -loss of balance or coordination -seizures -sweating -swelling of the hands or feet -tightness in the chest -tremors -unusually weak or tired Side effects that usually do not require medical attention (report to your doctor or health care professional if they continue or are bothersome): -constipation or diarrhea -headache This list may not describe all possible side effects. Call your doctor for medical advice about side effects. You may report side effects to FDA at 1-800-FDA-1088. Where should I keep my medicine? Keep out of the reach of children. Store between 2 and 30 degrees C (36 and 86 degrees F). Throw away any unused medicine after the expiration date. NOTE: This sheet is a summary. It may not cover all possible information. If you have questions about this medicine, talk to your doctor, pharmacist,  or health care  provider.  2017 Elsevier/Gold Standard (2013-09-05 16:27:45)   DTaP Vaccine (Diphtheria, Tetanus, and Pertussis): What You Need to Know 1. Why get vaccinated? Diphtheria, tetanus, and pertussis are serious diseases caused by bacteria. Diphtheria and pertussis are spread from person to person. Tetanus enters the body through cuts or wounds. DIPHTHERIA causes a thick covering in the back of the throat.  It can lead to breathing problems, paralysis, heart failure, and even death. TETANUS (Lockjaw) causes painful tightening of the muscles, usually all over the body.  It can lead to "locking" of the jaw so the victim cannot open his mouth or swallow. Tetanus leads to death in up to 2 out of 10 cases. PERTUSSIS (Whooping Cough) causes coughing spells so bad that it is hard for infants to eat, drink, or breathe. These spells can last for weeks.  It can lead to pneumonia, seizures (jerking and staring spells), brain damage, and death. Diphtheria, tetanus, and pertussis vaccine (DTaP) can help prevent these diseases. Most children who are vaccinated with DTaP will be protected throughout childhood. Many more children would get these diseases if we stopped vaccinating. DTaP is a safer version of an older vaccine called DTP. DTP is no longer used in the [place]. 2. Who should get DTaP vaccine and when? Children should get 5 doses of DTaP vaccine, one dose at each of the following ages:  2 months  4 months  6 months  15-18 months  4-6 years DTaP may be given at the same time as other vaccines. 3. Some children should not get DTaP vaccine or should wait  Children with minor illnesses, such as a cold, may be vaccinated. But children who are moderately or severely ill should usually wait until they recover before getting DTaP vaccine.  Any child who had a life-threatening allergic reaction after a dose of DTaP should not get another dose.  Any child who suffered a brain or nervous system  disease within 7 days after a dose of DTaP should not get another dose.  Talk with your doctor if your child:  had a seizure or collapsed after a dose of DTaP,  cried non-stop for 3 hours or more after a dose of DTaP,  had a fever over 105F after a dose of DTaP. Ask your doctor for more information. Some of these children should not get another dose of pertussis vaccine, but may get a vaccine without pertussis, called DT. 4. Older children and adults DTaP is not licensed for adolescents, adults, or children 46 years of age and older. But older people still need protection. A vaccine called Tdap is similar to DTaP. A single dose of Tdap is recommended for people 11 through 23 years of age. Another vaccine, called Td, protects against tetanus and diphtheria, but not pertussis. It is recommended every 10 years. There are separate Vaccine Information Statements for these vaccines. 5. What are the risks from DTaP vaccine? Getting diphtheria, tetanus, or pertussis disease is much riskier than getting DTaP vaccine. However, a vaccine, like any medicine, is capable of causing serious problems, such as severe allergic reactions. The risk of DTaP vaccine causing serious harm, or death, is extremely small. Mild problems (common)  Fever (up to about 1 child in 4)  Redness or swelling where the shot was given (up to about 1 child in 4)  Soreness or tenderness where the shot was given (up to about 1 child in 4) These problems occur more often after the 4th  and 5th doses of the DTaP series than after earlier doses. Sometimes the 4th or 5th dose of DTaP vaccine is followed by swelling of the entire arm or leg in which the shot was given, lasting 1-7 days (up to about 1 child in 30). Other mild problems include:  Fussiness (up to about 1 child in 3)  Tiredness or poor appetite (up to about 1 child in 10)  Vomiting (up to about 1 child in 50) These problems generally occur 1-3 days after the  shot. Moderate problems (uncommon)  Seizure (jerking or staring) (about 1 child out of 14,000)  Non-stop crying, for 3 hours or more (up to about 1 child out of 1,000)  High fever, over 105F (about 1 child out of 16,000) Severe problems (very rare)  Serious allergic reaction (less than 1 out of a million doses)  Several other severe problems have been reported after DTaP vaccine. These include:  Long-term seizures, coma, or lowered consciousness  Permanent brain damage. These are so rare it is hard to tell if they are caused by the vaccine. Controlling fever is especially important for children who have had seizures, for any reason. It is also important if another family member has had seizures. You can reduce fever and pain by giving your child an aspirin-free pain reliever when the shot is given, and for the next 24 hours, following the package instructions. 6. What if there is a serious reaction? What should I look for? Look for anything that concerns you, such as signs of a severe allergic reaction, very high fever, or behavior changes. Signs of a severe allergic reaction can include hives, swelling of the face and throat, difficulty breathing, a fast heartbeat, dizziness, and weakness. These would start a few minutes to a few hours after the vaccination. What should I do?  If you think it is a severe allergic reaction or other emergency that can't wait, call 9-1-1 or get the person to the nearest hospital. Otherwise, call your doctor.  Afterward, the reaction should be reported to the Vaccine Adverse Event Reporting System (VAERS). Your doctor might file this report, or you can do it yourself through the VAERS web site at www.vaers.LAgents.no, or by calling 1-(308)650-2518.  VAERS is only for reporting reactions. They do not give medical advice. 7. The National Vaccine Injury Compensation Program The [employer] Vaccine Injury Compensation Program (VICP) is a federal program that was  created to compensate people who may have been injured by certain vaccines. Persons who believe they may have been injured by a vaccine can learn about the program and about filing a claim by calling 1-(562)251-4268 or visiting the VICP website at SpiritualWord.at. 8. How can I learn more?  Ask your doctor.  Call your local or state health department.  Contact the Centers for Disease Control and Prevention (CDC):  Call 780-795-6429 (1-800-CDC-INFO) or  Visit CDC's website at PicCapture.uy CDC DTaP Vaccine (Diphtheria, Tetanus, and Pertussis) VIS (04/28/06) This information is not intended to replace advice given to you by your health care provider. Make sure you discuss any questions you have with your health care provider. Document Released: 09/26/2006 Document Revised: 08/19/2016 Document Reviewed: 08/19/2016 Elsevier Interactive Patient Education  2017 [employer].   First Trimester of Pregnancy The first trimester of pregnancy is from week 1 until the end of week 12 (months 1 through 3). A week after a sperm fertilizes an egg, the egg will implant on the wall of the uterus. This embryo will begin  to develop into a baby. Genes from you and your partner are forming the baby. The female genes determine whether the baby is a boy or a girl. At 6-8 weeks, the eyes and face are formed, and the heartbeat can be seen on ultrasound. At the end of 12 weeks, all the baby's organs are formed.  Now that you are pregnant, you will want to do everything you can to have a healthy baby. Two of the most important things are to get good prenatal care and to follow your health care provider's instructions. Prenatal care is all the medical care you receive before the baby's birth. This care will help prevent, find, and treat any problems during the pregnancy and childbirth. BODY CHANGES Your body goes through many changes during pregnancy. The changes vary from woman to woman.  You may  gain or lose a couple of pounds at first. You may feel sick to your stomach (nauseous) and throw up (vomit). If the vomiting is uncontrollable, call your health care provider. You may tire easily. You may develop headaches that can be relieved by medicines approved by your health care provider. You may urinate more often. Painful urination may mean you have a bladder infection. You may develop heartburn as a result of your pregnancy. You may develop constipation because certain hormones are causing the muscles that push waste through your intestines to slow down. You may develop hemorrhoids or swollen, bulging veins (varicose veins). Your breasts may begin to grow larger and become tender. Your nipples may stick out more, and the tissue that surrounds them (areola) may become darker. Your gums may bleed and may be sensitive to brushing and flossing. Dark spots or blotches (chloasma, mask of pregnancy) may develop on your face. This will likely fade after the baby is born. Your menstrual periods will stop. You may have a loss of appetite. You may develop cravings for certain kinds of food. You may have changes in your emotions from day to day, such as being excited to be pregnant or being concerned that something may go wrong with the pregnancy and baby. You may have more vivid and strange dreams. You may have changes in your hair. These can include thickening of your hair, rapid growth, and changes in texture. Some women also have hair loss during or after pregnancy, or hair that feels dry or thin. Your hair will most likely return to normal after your baby is born. WHAT TO EXPECT AT YOUR PRENATAL VISITS During a routine prenatal visit: You will be weighed to make sure you and the baby are growing normally. Your blood pressure will be taken. Your abdomen will be measured to track your baby's growth. The fetal heartbeat will be listened to starting around week 10 or 12 of your pregnancy. Test  results from any previous visits will be discussed. Your health care provider may ask you: How you are feeling. If you are feeling the baby move. If you have had any abnormal symptoms, such as leaking fluid, bleeding, severe headaches, or abdominal cramping. If you are using any tobacco products, including cigarettes, chewing tobacco, and electronic cigarettes. If you have any questions. Other tests that may be performed during your first trimester include: Blood tests to find your blood type and to check for the presence of any previous infections. They will also be used to check for low iron levels (anemia) and Rh antibodies. Later in the pregnancy, blood tests for diabetes will be done along with other tests  if problems develop. Urine tests to check for infections, diabetes, or protein in the urine. An ultrasound to confirm the proper growth and development of the baby. An amniocentesis to check for possible genetic problems. Fetal screens for spina bifida and Down syndrome. You may need other tests to make sure you and the baby are doing well. HIV (human immunodeficiency virus) testing. Routine prenatal testing includes screening for HIV, unless you choose not to have this test. HOME CARE INSTRUCTIONS  Medicines  Follow your health care provider's instructions regarding medicine use. Specific medicines may be either safe or unsafe to take during pregnancy. Take your prenatal vitamins as directed. If you develop constipation, try taking a stool softener if your health care provider approves. Diet  Eat regular, well-balanced meals. Choose a variety of foods, such as meat or vegetable-based protein, fish, milk and low-fat dairy products, vegetables, fruits, and whole grain breads and cereals. Your health care provider will help you determine the amount of weight gain that is right for you. Avoid raw meat and uncooked cheese. These carry germs that can cause birth defects in the baby. Eating  four or five small meals rather than three large meals a day may help relieve nausea and vomiting. If you start to feel nauseous, eating a few soda crackers can be helpful. Drinking liquids between meals instead of during meals also seems to help nausea and vomiting. If you develop constipation, eat more high-fiber foods, such as fresh vegetables or fruit and whole grains. Drink enough fluids to keep your urine clear or pale yellow. Activity and Exercise  Exercise only as directed by your health care provider. Exercising will help you: Control your weight. Stay in shape. Be prepared for labor and delivery. Experiencing pain or cramping in the lower abdomen or low back is a good sign that you should stop exercising. Check with your health care provider before continuing normal exercises. Try to avoid standing for long periods of time. Move your legs often if you must stand in one place for a long time. Avoid heavy lifting. Wear low-heeled shoes, and practice good posture. You may continue to have sex unless your health care provider directs you otherwise. Relief of Pain or Discomfort  Wear a good support bra for breast tenderness.  Take warm sitz baths to soothe any pain or discomfort caused by hemorrhoids. Use hemorrhoid cream if your health care provider approves.  Rest with your legs elevated if you have leg cramps or low back pain. If you develop varicose veins in your legs, wear support hose. Elevate your feet for 15 minutes, 3-4 times a day. Limit salt in your diet. Prenatal Care  Schedule your prenatal visits by the twelfth week of pregnancy. They are usually scheduled monthly at first, then more often in the last 2 months before delivery. Write down your questions. Take them to your prenatal visits. Keep all your prenatal visits as directed by your health care provider. Safety  Wear your seat belt at all times when driving. Make a list of emergency phone numbers, including numbers for  family, friends, the hospital, and police and fire departments. General Tips  Ask your health care provider for a referral to a local prenatal education class. Begin classes no later than at the beginning of month 6 of your pregnancy. Ask for help if you have counseling or nutritional needs during pregnancy. Your health care provider can offer advice or refer you to specialists for help with various needs. Do not use  hot tubs, steam rooms, or saunas. Do not douche or use tampons or scented sanitary pads. Do not cross your legs for long periods of time. Avoid cat litter boxes and soil used by cats. These carry germs that can cause birth defects in the baby and possibly loss of the fetus by miscarriage or stillbirth. Avoid all smoking, herbs, alcohol, and medicines not prescribed by your health care provider. Chemicals in these affect the formation and growth of the baby. Do not use any tobacco products, including cigarettes, chewing tobacco, and electronic cigarettes. If you need help quitting, ask your health care provider. You may receive counseling support and other resources to help you quit. Schedule a dentist appointment. At home, brush your teeth with a soft toothbrush and be gentle when you floss. SEEK MEDICAL CARE IF:  You have dizziness. You have mild pelvic cramps, pelvic pressure, or nagging pain in the abdominal area. You have persistent nausea, vomiting, or diarrhea. You have a bad smelling vaginal discharge. You have pain with urination. You notice increased swelling in your face, hands, legs, or ankles. SEEK IMMEDIATE MEDICAL CARE IF:  You have a fever. You are leaking fluid from your vagina. You have spotting or bleeding from your vagina. You have severe abdominal cramping or pain. You have rapid weight gain or loss. You vomit blood or material that looks like coffee grounds. You are exposed to [place] measles and have never had them. You are exposed to fifth disease or  chickenpox. You develop a severe headache. You have shortness of breath. You have any kind of trauma, such as from a fall or a car accident. This information is not intended to replace advice given to you by your health care provider. Make sure you discuss any questions you have with your health care provider. Document Released: 11/23/2001 Document Revised: 12/20/2014 Document Reviewed: 10/09/2013 Elsevier Interactive Patient Education  2017 [employer]

## 2017-02-04 NOTE — Progress Notes (Signed)
   Subjective:  Patient ID: Jennifer Lynn, female    DOB: 06/02/1996  Age: 21 y.o. MRN: [id number]  CC: Establish Care   HPI Jennifer Lynn presents for   1. Urine pregnancy: Urine pregnancy positive in office. Reports nausea, poor appetite, loose stool, fatigue and weight loss. Denies vomiting. Reports issues with constipation prior to pregnancy and history of frequent laxative use. Denies constitutional symptoms or eating any new foods.       No outpatient prescriptions prior to visit.   No facility-administered medications prior to visit.     ROS Review of Systems  Respiratory: Negative.   Cardiovascular: Negative.   Gastrointestinal: Positive for constipation and nausea.  Genitourinary: Negative.     Objective:  BP 125/78 (BP Location: Left Arm, Patient Position: Sitting, Cuff Size: Normal)   Pulse 73   Temp 98.6 F (37 C) (Oral)   Resp 18   Ht 5\' 3"  (1.6 m)   Wt 232 lb 6.4 oz (105.4 kg)   LMP 11/12/2016   SpO2 98%   BMI 41.17 kg/m   BP/Weight 02/04/2017 01/07/2017 03/25/2016  Systolic BP 125 118 107  Diastolic BP 78 81 53  Wt. (Lbs) 232.4 241.4 240  BMI 41.17 42.76 43.89     Physical Exam  Cardiovascular: Normal rate, regular rhythm, normal heart sounds and intact distal pulses.   Pulmonary/Chest: Effort normal and breath sounds normal.  Abdominal: Bowel sounds are normal.  Psychiatric: She has a normal mood and affect. Her behavior is normal. Thought content normal.  Nursing note and vitals reviewed.    Assessment & Plan:   Problem List Items Addressed This Visit    None    Visit Diagnoses    Positive urine pregnancy test    -  Primary   Relevant Medications   Prenatal Vit-Fe Fumarate-FA (PRENATAL/FOLIC ACID) TABS   Other Relevant Orders   Ambulatory referral to Obstetrics / Gynecology   POCT urine pregnancy (Completed)   Fatigue, unspecified type       Relevant Orders   Thyroid Panel With TSH   CBC with Differential   Weight loss        Relevant Orders   Thyroid Panel With TSH   Nausea       Relevant Medications   ondansetron (ZOFRAN) 4 MG tablet      Meds ordered this encounter  Medications  . ondansetron (ZOFRAN) 4 MG tablet    Sig: Take 1 tablet (4 mg total) by mouth every 8 (eight) hours as needed for nausea or vomiting.    Dispense:  20 tablet    Refill:  0    Order Specific Question:   Supervising Provider    Answer:   Quentin AngstJEGEDE, OLUGBEMIGA E [id number]  . Prenatal Vit-Fe Fumarate-FA (PRENATAL/FOLIC ACID) TABS    Sig: Take 1 tablet by mouth daily.    Dispense:  30 each    Refill:  11    Order Specific Question:   Supervising Provider    Answer:   Quentin AngstJEGEDE, OLUGBEMIGA E [[id number]]    Follow-up: Return As needed.   Lizbeth BarkMandesia R Verlia Kaney FNP

## 2017-02-04 NOTE — Progress Notes (Signed)
Patient is here for establish care   Patient is here for a referral to the [facility]  Patient denies pain today  Patient refuse lab, she seeing by her [facility] & she preferred to get them over there for economical

## 2017-02-07 NOTE — Telephone Encounter (Signed)
Patient seen in office on 02/04/2017. Concerns were addressed.

## 2017-02-17 ENCOUNTER — Encounter: Payer: Self-pay | Admitting: Medical

## 2017-02-17 ENCOUNTER — Ambulatory Visit (INDEPENDENT_AMBULATORY_CARE_PROVIDER_SITE_OTHER): Payer: Medicaid Other | Admitting: Medical

## 2017-02-17 ENCOUNTER — Encounter: Payer: Self-pay | Admitting: Family Medicine

## 2017-02-17 ENCOUNTER — Other Ambulatory Visit (HOSPITAL_COMMUNITY)
Admission: RE | Admit: 2017-02-17 | Discharge: 2017-02-17 | Disposition: A | Discharge status: Home / Self Care | Payer: Medicaid Other | Source: Ambulatory Visit | Attending: Medical | Admitting: Medical

## 2017-02-17 DIAGNOSIS — Z01419 Encounter for gynecological examination (general) (routine) without abnormal findings: Secondary | ICD-10-CM | POA: Insufficient documentation

## 2017-02-17 DIAGNOSIS — Z3490 Encounter for supervision of normal pregnancy, unspecified, unspecified trimester: Secondary | ICD-10-CM | POA: Insufficient documentation

## 2017-02-17 DIAGNOSIS — Z3401 Encounter for supervision of normal first pregnancy, first trimester: Secondary | ICD-10-CM

## 2017-02-17 DIAGNOSIS — Z23 Encounter for immunization: Secondary | ICD-10-CM

## 2017-02-17 DIAGNOSIS — Z124 Encounter for screening for malignant neoplasm of cervix: Secondary | ICD-10-CM

## 2017-02-17 DIAGNOSIS — Z3491 Encounter for supervision of normal pregnancy, unspecified, first trimester: Secondary | ICD-10-CM

## 2017-02-17 DIAGNOSIS — Z113 Encounter for screening for infections with a predominantly sexual mode of transmission: Secondary | ICD-10-CM

## 2017-02-17 LAB — POCT URINALYSIS DIP (DEVICE)
Glucose, UA: NEGATIVE mg/dL
Ketones, ur: 80 mg/dL — AB
Nitrite: NEGATIVE
PH: 5 (ref 5.0–8.0)
Protein, ur: 30 mg/dL — AB
Specific Gravity, Urine: >=1.03 (ref 1.005–1.030)
Urobilinogen, UA: 0.2 mg/dL (ref 0.0–1.0)

## 2017-02-17 MED ORDER — COMPLETENATE 29-1 MG PO CHEW: 1.0000 | CHEWABLE_TABLET | Freq: Every day | ORAL | Status: DC

## 2017-02-17 MED ORDER — FERROUS SULFATE 325 (65 FE) MG PO TABS: 325.0000 mg | ORAL_TABLET | Freq: Every day | ORAL | 3 refills | Status: DC

## 2017-02-17 NOTE — Progress Notes (Signed)
Patient reports headaches with dizziness over the past week- has tried no OTC meds  Patient declines seeing Asher MuirJamie today- feels fine  Reports diarrhea since becoming pregnant

## 2017-02-17 NOTE — Patient Instructions (Signed)
First Trimester of Pregnancy The first trimester of pregnancy is from week 1 until the end of week 13 (months 1 through 3). During this time, your baby will begin to develop inside you. At 6-8 weeks, the eyes and face are formed, and the heartbeat can be seen on ultrasound. At the end of 12 weeks, all the baby's organs are formed. Prenatal care is all the medical care you receive before the birth of your baby. Make sure you get good prenatal care and follow all of your doctor's instructions. Follow these instructions at home: Medicines  Take over-the-counter and prescription medicines only as told by your doctor. Some medicines are safe and some medicines are not safe during pregnancy.  Take a prenatal vitamin that contains at least 600 micrograms (mcg) of folic acid.  If you have trouble pooping (constipation), take medicine that will make your stool soft (stool softener) if your doctor approves. Eating and drinking  Eat regular, healthy meals.  Your doctor will tell you the amount of weight gain that is right for you.  Avoid raw meat and uncooked cheese.  If you feel sick to your stomach (nauseous) or throw up (vomit): ? Eat 4 or 5 small meals a day instead of 3 large meals. ? Try eating a few soda crackers. ? Drink liquids between meals instead of during meals.  To prevent constipation: ? Eat foods that are high in fiber, like fresh fruits and vegetables, whole grains, and beans. ? Drink enough fluids to keep your pee (urine) clear or pale yellow. Activity  Exercise only as told by your doctor. Stop exercising if you have cramps or pain in your lower belly (abdomen) or low back.  Do not exercise if it is too hot, too humid, or if you are in a place of great height (high altitude).  Try to avoid standing for long periods of time. Move your legs often if you must stand in one place for a long time.  Avoid heavy lifting.  Wear low-heeled shoes. Sit and stand up straight.  You  can have sex unless your doctor tells you not to. Relieving pain and discomfort  Wear a good support bra if your breasts are sore.  Take warm water baths (sitz baths) to soothe pain or discomfort caused by hemorrhoids. Use hemorrhoid cream if your doctor says it is okay.  Rest with your legs raised if you have leg cramps or low back pain.  If you have puffy, bulging veins (varicose veins) in your legs: ? Wear support hose or compression stockings as told by your doctor. ? Raise (elevate) your feet for 15 minutes, 3-4 times a day. ? Limit salt in your food. Prenatal care  Schedule your prenatal visits by the twelfth week of pregnancy.  Write down your questions. Take them to your prenatal visits.  Keep all your prenatal visits as told by your doctor. This is important. Safety  Wear your seat belt at all times when driving.  Make a list of emergency phone numbers. The list should include numbers for family, friends, the hospital, and police and fire departments. General instructions  Ask your doctor for a referral to a local prenatal class. Begin classes no later than at the start of month 6 of your pregnancy.  Ask for help if you need counseling or if you need help with nutrition. Your doctor can give you advice or tell you where to go for help.  Do not use hot tubs, steam rooms, or   not use hot tubs, steam rooms, or saunas.  Do not douche or use tampons or scented sanitary pads.  Do not cross your legs for long periods of time.  Avoid all herbs and alcohol. Avoid drugs that are not approved by your doctor.  Do not use any tobacco products, including cigarettes, chewing tobacco, and electronic cigarettes. If you need help quitting, ask your doctor. You may get counseling or other support to help you quit.  Avoid cat litter boxes and soil used by cats. These carry germs that can cause birth defects in the baby and can cause a loss of your baby (miscarriage) or stillbirth.  Visit your dentist. At home,  brush your teeth with a soft toothbrush. Be gentle when you floss. Contact a doctor if:  You are dizzy.  You have mild cramps or pressure in your lower belly.  You have a nagging pain in your belly area.  You continue to feel sick to your stomach, you throw up, or you have watery poop (diarrhea).  You have a bad smelling fluid coming from your vagina.  You have pain when you pee (urinate).  You have increased puffiness (swelling) in your face, hands, legs, or ankles. Get help right away if:  You have a fever.  You are leaking fluid from your vagina.  You have spotting or bleeding from your vagina.  You have very bad belly cramping or pain.  You gain or lose weight rapidly.  You throw up blood. It may look like coffee grounds.  You are around people who have [place] measles, fifth disease, or chickenpox.  You have a very bad headache.  You have shortness of breath.  You have any kind of trauma, such as from a fall or a car accident. Summary  The first trimester of pregnancy is from week 1 until the end of week 13 (months 1 through 3).  To take care of yourself and your unborn baby, you will need to eat healthy meals, take medicines only if your doctor tells you to do so, and do activities that are safe for you and your baby.  Keep all follow-up visits as told by your doctor. This is important as your doctor will have to ensure that your baby is healthy and growing well. This information is not intended to replace advice given to you by your health care provider. Make sure you discuss any questions you have with your health care provider. Document Released: 05/17/2008 Document Revised: 12/07/2016 Document Reviewed: 12/07/2016 Elsevier Interactive Patient Education  2017 [employer] Eating Plan for Hyperemesis Gravidarum Hyperemesis gravidarum is a severe form of morning sickness. Because this condition causes severe nausea and vomiting, it can lead to dehydration,  malnutrition, and weight loss. One way to lessen the symptoms of nausea and vomiting is to follow the eating plan for hyperemesis gravidarum. It is often used along with prescribed medicines to control your symptoms. What can I do to relieve my symptoms? Listen to your body. Everyone is different and has different preferences. Find what works best for you. Take any of the following actions that are helpful to you:  Eat and drink slowly.  Eat 5-6 small meals daily instead of 3 large meals.  Eat crackers before you get out of bed in the morning.  Try having a snack in the middle of the night.  Starchy foods are usually tolerated well. Examples include cereal, toast, bread, potatoes, pasta, rice, and pretzels.  Ginger may help with nausea. Add  tsp ground ginger to hot tea or choose ginger tea.  Try drinking 100% fruit juice or an electrolyte drink. An electrolyte drink contains sodium, potassium, and chloride.  Continue to take your prenatal vitamins as told by your health care provider. If you are having trouble taking your prenatal vitamins, talk with your health care provider about different options.  Include at least 1 serving of protein with your meals and snacks. Protein options include meats or poultry, beans, nuts, eggs, and yogurt. Try eating a protein-rich snack before bed. Examples of these snacks include cheese and crackers or half of a peanut butter or [place] sandwich.  Consider eliminating foods that trigger your symptoms. These may include spicy foods, coffee, high-fat foods, very sweet foods, and acidic foods.  Try meals that have more protein combined with bland, salty, lower-fat, and dry foods, such as nuts, seeds, pretzels, crackers, and cereal.  Talk with your healthcare provider about starting a supplement of vitamin B6.  Have fluids that are cold, clear, and carbonated or sour. Examples include lemonade, ginger ale, lemon-lime soda, ice water, and sparkling  water.  Try lemon or mint tea.  Try brushing your teeth or using a mouth rinse after meals. What should I avoid to reduce my symptoms? Avoiding some of the following things may help reduce your symptoms.  Foods with strong smells. Try eating meals in well-ventilated areas that are free of odors.  Drinking water or other beverages with meals. Try not to drink anything during the 30 minutes before and after your meals.  Drinking more than 1 cup of fluid at a time. Sometimes using a straw helps.  Fried or high-fat foods, such as butter and cream sauces.  Spicy foods.  Skipping meals as best as you can. Nausea can be more intense on an empty stomach. If you cannot tolerate food at that time, do not force it. Try sucking on ice chips or other frozen items, and make up for missed calories later.  Lying down within 2 hours after eating.  Environmental triggers. These may include smoky rooms, closed spaces, rooms with strong smells, warm or humid places, overly loud and noisy rooms, and rooms with motion or flickering lights.  Quick and sudden changes in your movement. This information is not intended to replace advice given to you by your health care provider. Make sure you discuss any questions you have with your health care provider. Document Released: 09/26/2007 Document Revised: 07/28/2016 Document Reviewed: 06/29/2016 Elsevier Interactive Patient Education  2017 [employer].

## 2017-02-17 NOTE — Progress Notes (Signed)
   PRENATAL VISIT NOTE  Subjective:  Jennifer Lynn is a 21 y.o. G1P0000 at 6775w1d being seen today for her initial prenatal visit.  She is currently monitored for the following issues for this low-risk pregnancy and has Morbid obesity with BMI of 40.0-44.9, adult (HCC) and Supervision of low-risk pregnancy on her problem list.  Patient reports fatigue, headache, nausea, vomiting and dizziness. The patient was given Zofran by PCP but did not want to take it. She states she was only having nausea at that time, but has started to have more vomiting now. She is able to eat and drink. She states headaches are frequent, but mild. She has not taken anything for pain. She states dizzy episodes with changes of position only.  Contractions: Not present. Vag. Bleeding: None.  Movement: Absent. Denies leaking of fluid.   The following portions of the patient's history were reviewed and updated as appropriate: allergies, current medications, past family history, past medical history, past social history, past surgical history and problem list. Problem list updated.  Objective:   Vitals:   02/17/17 0749  BP: 116/78  Pulse: 73  Weight: 225 lb 12.8 oz (102.4 kg)    Fetal Status: Fetal Heart Rate (bpm): 165   Movement: Absent     General:  Alert, oriented and cooperative. Patient is in no acute distress.  Skin: Skin is warm and dry. No rash noted.   Cardiovascular: Normal heart rate noted  Respiratory: Normal respiratory effort, no problems with respiration noted  Abdomen: Soft, gravid, appropriate for gestational age. Pain/Pressure: Present     Pelvic:  Cervical exam performed Dilation: Closed Effacement (%): 0    Extremities: Normal range of motion.  Edema: None  Mental Status: Normal mood and affect. Normal behavior. Normal judgment and thought content.   Assessment and Plan:  Pregnancy: G1P0000 at 5575w1d  1. Encounter for supervision of low-risk pregnancy in first trimester - Prenatal  Profile I - Hemoglobinopathy Evaluation - Culture, OB Urine - Cytology - PAP - US MFM Fetal Nuchal Translucency; Future - Flu Vaccine QUAD 36+ mos IM (Fluarix, Quad PF) - prenatal vitamin w/FE, FA (NATACHEW) chewable tablet 1 tablet; Chew 1 tablet by mouth daily at 12 noon. - ferrous sulfate 325 (65 FE) MG tablet; Take 1 tablet (325 mg total) by mouth daily with breakfast.  Dispense: 30 tablet; Refill: 3 - HgB A1c  2. Nausea and vomiting in pregnancy, first trimester - Discussed diet for N/V in pregnancy - Advised increased PO hydration  - Discussed use of TUMs for heartburn as needed   First trimester warning signs and symptoms and general obstetric precautions including but not limited to vaginal bleeding, contractions, leaking of fluid and fetal movement were reviewed in detail with the patient. Please refer to After Visit Summary for other counseling recommendations.  Return in about 4 weeks (around 03/17/2017) for LOB.   Marny LowensteinJulie N Wenzel, PA-C

## 2017-02-20 ENCOUNTER — Emergency Department (HOSPITAL_COMMUNITY): Payer: Medicaid Other

## 2017-02-20 ENCOUNTER — Encounter (HOSPITAL_COMMUNITY): Payer: Self-pay | Admitting: Emergency Medicine

## 2017-02-20 ENCOUNTER — Emergency Department (HOSPITAL_COMMUNITY)
Admission: EM | Admit: 2017-02-20 | Discharge: 2017-02-20 | Disposition: A | Discharge status: Home / Self Care | Payer: Medicaid Other | Attending: Emergency Medicine | Admitting: Emergency Medicine

## 2017-02-20 DIAGNOSIS — N3001 Acute cystitis with hematuria: Secondary | ICD-10-CM

## 2017-02-20 DIAGNOSIS — Z5181 Encounter for therapeutic drug level monitoring: Secondary | ICD-10-CM | POA: Diagnosis not present

## 2017-02-20 DIAGNOSIS — Z3A10 10 weeks gestation of pregnancy: Secondary | ICD-10-CM

## 2017-02-20 DIAGNOSIS — Z79899 Other long term (current) drug therapy: Secondary | ICD-10-CM | POA: Diagnosis not present

## 2017-02-20 DIAGNOSIS — N939 Abnormal uterine and vaginal bleeding, unspecified: Secondary | ICD-10-CM

## 2017-02-20 DIAGNOSIS — O26891 Other specified pregnancy related conditions, first trimester: Secondary | ICD-10-CM | POA: Diagnosis present

## 2017-02-20 DIAGNOSIS — R1084 Generalized abdominal pain: Secondary | ICD-10-CM

## 2017-02-20 DIAGNOSIS — O2341 Unspecified infection of urinary tract in pregnancy, first trimester: Secondary | ICD-10-CM | POA: Insufficient documentation

## 2017-02-20 LAB — CBC WITH DIFFERENTIAL/PLATELET
BASOS ABS: 0 10*3/uL (ref 0.0–0.1)
Basophils Relative: 0 %
EOS ABS: 0 10*3/uL (ref 0.0–0.7)
EOS PCT: 1 %
HCT: 33.4 % — ABNORMAL LOW (ref 36.0–46.0)
Hemoglobin: 10.8 g/dL — ABNORMAL LOW (ref 12.0–15.0)
Lymphocytes Relative: 33 %
Lymphs Abs: 2 10*3/uL (ref 0.7–4.0)
MCH: 25.9 pg — ABNORMAL LOW (ref 26.0–34.0)
MCHC: 32.3 g/dL (ref 30.0–36.0)
MCV: 80.1 fL (ref 78.0–100.0)
Monocytes Absolute: 0.5 10*3/uL (ref 0.1–1.0)
Monocytes Relative: 9 %
Neutro Abs: 3.5 10*3/uL (ref 1.7–7.7)
Neutrophils Relative %: 57 %
PLATELETS: 228 10*3/uL (ref 150–400)
RBC: 4.17 MIL/uL (ref 3.87–5.11)
RDW: 14.3 % (ref 11.5–15.5)
WBC: 6 10*3/uL (ref 4.0–10.5)

## 2017-02-20 LAB — COMPREHENSIVE METABOLIC PANEL
ALT: 15 U/L (ref 14–54)
AST: 18 U/L (ref 15–41)
Albumin: 3.6 g/dL (ref 3.5–5.0)
Alkaline Phosphatase: 44 U/L (ref 38–126)
Anion gap: 9 (ref 5–15)
BUN: 6 mg/dL (ref 6–20)
CHLORIDE: 104 mmol/L (ref 101–111)
CO2: 22 mmol/L (ref 22–32)
CREATININE: 0.64 mg/dL (ref 0.44–1.00)
Calcium: 9 mg/dL (ref 8.9–10.3)
GFR calc Af Amer: >60 mL/min (ref >60)
GFR calc non Af Amer: >60 mL/min (ref >60)
GLUCOSE: 96 mg/dL (ref 65–99)
Potassium: 3.5 mmol/L (ref 3.5–5.1)
SODIUM: 135 mmol/L (ref 135–145)
Total Bilirubin: 0.4 mg/dL (ref 0.3–1.2)
Total Protein: 6.4 g/dL — ABNORMAL LOW (ref 6.5–8.1)

## 2017-02-20 LAB — URINALYSIS, ROUTINE W REFLEX MICROSCOPIC
Bilirubin Urine: NEGATIVE
Glucose, UA: NEGATIVE mg/dL
Hgb urine dipstick: NEGATIVE
KETONES UR: 80 mg/dL — AB
Nitrite: NEGATIVE
PROTEIN: 30 mg/dL — AB
Specific Gravity, Urine: 1.026 (ref 1.005–1.030)
pH: 5 (ref 5.0–8.0)

## 2017-02-20 LAB — LIPASE, BLOOD: Lipase: 15 U/L (ref 11–51)

## 2017-02-20 LAB — ABO/RH: ABO/RH(D): O POS

## 2017-02-20 LAB — PROTIME-INR
INR: 1.17
Prothrombin Time: 14.9 seconds (ref 11.4–15.2)

## 2017-02-20 LAB — HCG, QUANTITATIVE, PREGNANCY: hCG, Beta Chain, Quant, S: 109002 m[IU]/mL — ABNORMAL HIGH (ref <5)

## 2017-02-20 MED ORDER — CEPHALEXIN 500 MG PO CAPS: 500.0000 mg | ORAL_CAPSULE | Freq: Two times a day (BID) | ORAL | 0 refills | Status: AC

## 2017-02-20 NOTE — ED Provider Notes (Signed)
[facility]-EMERGENCY DEPT Provider Note   CSN: [id number] Arrival date & time: 02/20/17  0354     History   Chief Complaint Chief Complaint  Patient presents with  . Abdominal Pain    HPI Jennifer Lynn is a 21 y.o. female with no significant past medical history aside from 10 week pregnancy who presents with a 6 hour history of abdominal cramping and vaginal spotting of blood. Patient reports that she got into an argument with her family just prior to symptom onset. Patient says that she started having cramping in her flanks raiding towards her lower abdomen and into her groin. She says this pain is new. She describes the pain as moderate. She says it is better with rest and worsened with movement. She reports mild nausea but no vomiting. She reports several months of chronic diarrhea. She reports urinary frequency for the last 2 months but denies dysuria. She says that she noticed she had small spotting when she was wiping with toilet paper tonight. She denies active bleeding or passing of tissue. She denies other symptoms including no fevers, chills, chest pain, shortness of breath, cough, or other symptoms. She has not taken any medicine to help her symptoms. She denies any recent abdominal trauma. She denies any complications with the pregnancy thus far. He reports that she recently had her initial OB visit with Pap smear several days ago.    The history is provided by the patient, a significant other and medical records. No language interpreter was used.  Abdominal Cramping  This is a new problem. The current episode started 6 to 12 hours ago. The problem occurs constantly. The problem has not changed since onset.Associated symptoms include abdominal pain. Pertinent negatives include no chest pain, no headaches and no shortness of breath. The symptoms are aggravated by twisting and bending. Nothing relieves the symptoms. She has tried nothing for the symptoms. The treatment provided no  relief.    Past Medical History:  Diagnosis Date  . Medical history non-contributory     Patient Active Problem List   Diagnosis Date Noted  . Supervision of low-risk pregnancy 02/17/2017  . Morbid obesity with BMI of 40.0-44.9, adult (HCC) 06/26/2015    Past Surgical History:  Procedure Laterality Date  . NO PAST SURGERIES      OB History    Gravida Para Term Preterm AB Living   1 0 0 0 0 0   SAB TAB Ectopic Multiple Live Births   0 0 0 0 0       Home Medications    Prior to Admission medications   Medication Sig Start Date End Date Taking? Authorizing Provider  ferrous sulfate 325 (65 FE) MG tablet Take 1 tablet (325 mg total) by mouth daily with breakfast. 02/17/17   Marny Lowenstein, PA-C  ondansetron (ZOFRAN) 4 MG tablet Take 1 tablet (4 mg total) by mouth every 8 (eight) hours as needed for nausea or vomiting. Patient not taking: Reported on 02/17/2017 02/04/17   Lizbeth Bark, FNP  Prenatal Vit-Fe Fumarate-FA (PRENATAL/FOLIC ACID) TABS Take 1 tablet by mouth daily. Patient not taking: Reported on 02/17/2017 02/04/17   Lizbeth Bark, FNP    Family History Family History  Problem Relation Age of Onset  . Diabetes Maternal Grandmother   . Diabetes Paternal Grandmother     Social History Social History  Substance Use Topics  . Smoking status: Never Smoker  . Smokeless tobacco: Never Used  . Alcohol use No  Allergies   Patient has no known allergies.   Review of Systems Review of Systems  Constitutional: Negative for activity change, chills, diaphoresis, fatigue and fever.  HENT: Negative for congestion and rhinorrhea.   Eyes: Negative for visual disturbance.  Respiratory: Negative for cough, chest tightness, shortness of breath and stridor.   Cardiovascular: Negative for chest pain, palpitations and leg swelling.  Gastrointestinal: Positive for abdominal pain and nausea. Negative for abdominal distention, anal bleeding, constipation, diarrhea  and vomiting.  Genitourinary: Positive for frequency and vaginal bleeding. Negative for decreased urine volume, difficulty urinating, dysuria, flank pain, hematuria, menstrual problem, pelvic pain, urgency, vaginal discharge and vaginal pain.  Musculoskeletal: Negative for back pain and neck pain.  Skin: Negative for rash and wound.  Neurological: Negative for dizziness, weakness, light-headedness, numbness and headaches.  Psychiatric/Behavioral: Negative for agitation and confusion.  All other systems reviewed and are negative.    Physical Exam Updated Vital Signs BP 116/74 (BP Location: Left Arm)   Pulse 85   Temp 98.4 F (36.9 C) (Oral)   Resp 16   LMP 12/08/2016 (Exact Date)   SpO2 99%   Physical Exam  Constitutional: She is oriented to person, place, and time. She appears well-developed and well-nourished. No distress.  HENT:  Head: Normocephalic and atraumatic.  Nose: Nose normal.  Mouth/Throat: Oropharynx is clear and moist. No oropharyngeal exudate.  Eyes: Conjunctivae and EOM are normal. Pupils are equal, round, and reactive to light.  Neck: Normal range of motion. Neck supple.  Cardiovascular: Normal rate, normal heart sounds and intact distal pulses.   No murmur heard. Pulmonary/Chest: Effort normal and breath sounds normal. No stridor. No respiratory distress. She has no wheezes. She exhibits no tenderness.  Abdominal: Soft. She exhibits no distension. There is no tenderness. There is no rebound.  Musculoskeletal: She exhibits no tenderness.  Neurological: She is alert and oriented to person, place, and time. She has normal reflexes. She exhibits normal muscle tone. Coordination normal.  Skin: Skin is warm. Capillary refill takes less than 2 seconds. No rash noted. She is not diaphoretic. No erythema.  Psychiatric: She has a normal mood and affect.  Nursing note and vitals reviewed.    ED Treatments / Results  Labs (all labs ordered are listed, but only abnormal  results are displayed) Labs Reviewed  URINALYSIS, ROUTINE W REFLEX MICROSCOPIC - Abnormal; Notable for the following:       Result Value   APPearance HAZY (*)    Ketones, ur 80 (*)    Protein, ur 30 (*)    Leukocytes, UA TRACE (*)    Bacteria, UA FEW (*)    Squamous Epithelial / LPF 0-5 (*)    All other components within normal limits  HCG, QUANTITATIVE, PREGNANCY - Abnormal; Notable for the following:    hCG, Beta Chain, Quant, S 109,002 (*)    All other components within normal limits  CBC WITH DIFFERENTIAL/PLATELET - Abnormal; Notable for the following:    Hemoglobin 10.8 (*)    HCT 33.4 (*)    MCH 25.9 (*)    All other components within normal limits  COMPREHENSIVE METABOLIC PANEL - Abnormal; Notable for the following:    Total Protein 6.4 (*)    All other components within normal limits  URINE CULTURE  LIPASE, BLOOD  PROTIME-INR  ABO/RH    EKG  EKG Interpretation None       Radiology US Ob Comp Less 14 Wks  Result Date: 02/20/2017 CLINICAL DATA:  Abdominal pain,  onset at 22:00 EXAM: OBSTETRIC <14 WK [place] AND TRANSVAGINAL OB US TECHNIQUE: Both transabdominal and transvaginal ultrasound examinations were performed for complete evaluation of the gestation as well as the maternal uterus, adnexal regions, and pelvic cul-de-sac. Transvaginal technique was performed to assess early pregnancy. COMPARISON:  None. FINDINGS: Intrauterine gestational sac: Single Yolk sac:  Not visible Embryo:  Visible Cardiac Activity: Visible Heart Rate: 160  bpm MSD:   mm    w     d CRL:  35.2  mm   10 w   3 d                  [place] EDC: 09/15/2017 Subchorionic hemorrhage:  None visualized. Maternal uterus/adnexae: Normal ovaries. No abnormal pelvic fluid collections. IMPRESSION: Single living intrauterine gestation measuring 10 weeks 3 days by crown-rump length. Electronically Signed   By: Ellery Plunk M.D.   On: 02/20/2017 06:10    Procedures Procedures (including critical care  time)  Medications Ordered in ED Medications - No data to display   Initial Impression / Assessment and Plan / ED Course  I have reviewed the triage vital signs and the nursing notes.  Pertinent labs & imaging results that were available during my care of the patient were reviewed by me and considered in my medical decision making (see chart for details).     Jennifer Lynn is a 21 y.o. female with no significant past medical history aside from 10 week pregnancy who presents with a 6 hour history of abdominal cramping and vaginal spotting of blood.  History and exam are seen above. Next  On exam, patient had no abdominal tenderness. Lungs were clear. No focal neurologic deficits or lower extremity abnormalities.  Given patient's reported pregnancy, abdominal cramping, and vaginal spotting, there is clinical concern for miscarriage. Patient will have laboratory testing to rule out other abdominal etiology of pain, urinalysis to look for UTI, and she will have ultrasound testing to look for complication with pregnancy.  As patient is not in current pain, will hold on pain medications.  Anticipate reassessment following workup.  Diagnostic testing showed evidence of urinary tract infection in the setting of frequency. Patient will be given prescription for Keflex.  Ultrasound showed single intrauterine viable pregnancy with a normal heart rate. No evidence of common locations on ultrasound.  Patient reassured of her ultrasound findings in her lab workup. Patient was however informed that she may still be in the middle of a miscarriage. Patient will be instructed to follow-up with her OB/GYN team as well as her PCP. Patient given return precautions for any new or worsening symptoms.  Patient given prescription for antibiotics. Patient had no other questions or concerns and patient was discharged in good condition.   Final Clinical Impressions(s) / ED Diagnoses   Final diagnoses:   Acute cystitis with hematuria  Vaginal spotting  [redacted] weeks gestation of pregnancy  Generalized abdominal pain    New Prescriptions Discharge Medication List as of 02/20/2017  6:51 AM    START taking these medications   Details  cephALEXin (KEFLEX) 500 MG capsule Take 1 capsule (500 mg total) by mouth 2 (two) times daily., Starting Sun 02/20/2017, Until Sun 02/27/2017, Print        Clinical Impression: 1. Acute cystitis with hematuria   2. Vaginal spotting   3. [redacted] weeks gestation of pregnancy   4. Generalized abdominal pain     Disposition: Discharge  Condition: Good  I have discussed the results, Dx  and Tx plan with the pt(& family if present). He/she/they expressed understanding and agree(s) with the plan. Discharge instructions discussed at great length. Strict return precautions discussed and pt &/or family have verbalized understanding of the instructions. No further questions at time of discharge.    Discharge Medication List as of 02/20/2017  6:51 AM    START taking these medications   Details  cephALEXin (KEFLEX) 500 MG capsule Take 1 capsule (500 mg total) by mouth 2 (two) times daily., Starting Sun 02/20/2017, Until Sun 02/27/2017, Print        Follow Up: [facility] [address] [place] [id number] [phone]    [facility] [address] [id number] [address]  [place] [phone]  If symptoms worsen     Heide Scaleshristopher J Tegeler, MD 02/20/17 [phone]

## 2017-02-20 NOTE — ED Notes (Signed)
Patient transported to US 

## 2017-02-20 NOTE — ED Notes (Signed)
Patient transported to Ultrasound 

## 2017-02-20 NOTE — ED Triage Notes (Signed)
Patient with abdominal pain and light vaginal bleeding.  She states she is [redacted] weeks pregnant.  EDC: 09/14/2017

## 2017-02-20 NOTE — Discharge Instructions (Signed)
Please take your antibiotics to treat your urinary tract infection. Please schedule an appointment to follow-up with both your primary care physician and/or OB/GYN team. If any symptoms acutely worsen or you begin having any new symptoms, please return to the nearest emergency department.

## 2017-02-21 LAB — URINE CULTURE, OB REFLEX

## 2017-02-21 LAB — URINE CULTURE

## 2017-02-21 LAB — CYTOLOGY - PAP
CHLAMYDIA, DNA PROBE: NEGATIVE
DIAGNOSIS: NEGATIVE
Neisseria Gonorrhea: NEGATIVE

## 2017-02-21 LAB — CULTURE, OB URINE

## 2017-03-01 ENCOUNTER — Encounter (HOSPITAL_COMMUNITY): Payer: Self-pay | Admitting: Medical

## 2017-03-03 LAB — HEMOGLOBINOPATHY EVALUATION
Ferritin: 68 ng/mL (ref 15–150)
HGB A: 97.4 % (ref 96.4–98.8)
HGB C: 0 %
HGB S: 0 %
HGB VARIANT: 0 %
Hgb A2 Quant: 2.6 % (ref 1.8–3.2)
Hgb F Quant: 0 % (ref 0.0–2.0)
Hgb Solubility: NEGATIVE

## 2017-03-03 LAB — PRENATAL PROFILE I(LABCORP)
ANTIBODY SCREEN: NEGATIVE
BASOS: 0 %
Basophils Absolute: 0 10*3/uL (ref 0.0–0.2)
EOS (ABSOLUTE): 0 10*3/uL (ref 0.0–0.4)
EOS: 0 %
HEMATOCRIT: 35.5 % (ref 34.0–46.6)
HEP B S AG: NEGATIVE
Hemoglobin: 11.9 g/dL (ref 11.1–15.9)
Immature Grans (Abs): 0 10*3/uL (ref 0.0–0.1)
Immature Granulocytes: 0 %
Lymphocytes Absolute: 1.7 10*3/uL (ref 0.7–3.1)
Lymphs: 30 %
MCH: 26.6 pg (ref 26.6–33.0)
MCHC: 33.5 g/dL (ref 31.5–35.7)
MCV: 79 fL (ref 79–97)
MONOCYTES: 9 %
Monocytes Absolute: 0.5 10*3/uL (ref 0.1–0.9)
NEUTROS ABS: 3.3 10*3/uL (ref 1.4–7.0)
Neutrophils: 61 %
Platelets: 279 10*3/uL (ref 150–379)
RBC: 4.47 x10E6/uL (ref 3.77–5.28)
RDW: 15.1 % (ref 12.3–15.4)
RH TYPE: POSITIVE
RPR: NONREACTIVE
RUBELLA: 5.2 {index} (ref >0.99)
WBC: 5.5 10*3/uL (ref 3.4–10.8)

## 2017-03-03 LAB — ALPHA-THALASSEMIA

## 2017-03-03 LAB — HEMOGLOBIN A1C
Est. average glucose Bld gHb Est-mCnc: 105 mg/dL
Hgb A1c MFr Bld: 5.3 % (ref 4.8–5.6)

## 2017-03-09 ENCOUNTER — Ambulatory Visit (HOSPITAL_COMMUNITY)
Admission: RE | Admit: 2017-03-09 | Discharge: 2017-03-09 | Disposition: A | Discharge status: Home / Self Care | Payer: Medicaid Other | Source: Ambulatory Visit | Attending: Medical | Admitting: Medical

## 2017-03-09 ENCOUNTER — Other Ambulatory Visit: Payer: Self-pay | Admitting: Medical

## 2017-03-09 ENCOUNTER — Encounter (HOSPITAL_COMMUNITY): Payer: Self-pay

## 2017-03-09 ENCOUNTER — Other Ambulatory Visit: Payer: Self-pay

## 2017-03-09 DIAGNOSIS — Z3682 Encounter for antenatal screening for nuchal translucency: Secondary | ICD-10-CM | POA: Insufficient documentation

## 2017-03-09 DIAGNOSIS — Z3A13 13 weeks gestation of pregnancy: Secondary | ICD-10-CM

## 2017-03-09 DIAGNOSIS — Z3491 Encounter for supervision of normal pregnancy, unspecified, first trimester: Secondary | ICD-10-CM

## 2017-03-10 ENCOUNTER — Other Ambulatory Visit (HOSPITAL_COMMUNITY): Payer: Self-pay | Admitting: *Deleted

## 2017-03-10 DIAGNOSIS — O99210 Obesity complicating pregnancy, unspecified trimester: Secondary | ICD-10-CM

## 2017-03-17 ENCOUNTER — Ambulatory Visit (INDEPENDENT_AMBULATORY_CARE_PROVIDER_SITE_OTHER): Payer: Medicaid Other | Admitting: Obstetrics and Gynecology

## 2017-03-17 ENCOUNTER — Encounter: Payer: Self-pay | Admitting: Family Medicine

## 2017-03-17 DIAGNOSIS — D563 Thalassemia minor: Secondary | ICD-10-CM | POA: Insufficient documentation

## 2017-03-17 NOTE — Progress Notes (Signed)
Prenatal Visit Note Date: 03/17/2017 Clinic: [facility]  Subjective:  Jennifer Lynn is a 21 y.o. G1P0000 at [redacted]w[redacted]d being seen today for ongoing prenatal care.  She is currently monitored for the following issues for this low-risk pregnancy and has Morbid obesity with BMI of 40.0-44.9, adult (HCC); Supervision of low-risk pregnancy; and Alpha thalassemia trait on her problem list.  Patient reports no complaints.   Contractions: Not present. Vag. Bleeding: None.   . Denies leaking of fluid.   The following portions of the patient's history were reviewed and updated as appropriate: allergies, current medications, past family history, past medical history, past social history, past surgical history and problem list. Problem list updated.  Objective:   Vitals:   03/17/17 0849  BP: 105/73  Pulse: 73  Weight: 223 lb 6.4 oz (101.3 kg)    Fetal Status: Fetal Heart Rate (bpm): 155         General:  Alert, oriented and cooperative. Patient is in no acute distress.  Skin: Skin is warm and dry. No rash noted.   Cardiovascular: Normal heart rate noted  Respiratory: Normal respiratory effort, no problems with respiration noted  Abdomen: Soft, gravid, appropriate for gestational age. Pain/Pressure: Present     Pelvic:  Cervical exam deferred        Extremities: Normal range of motion.  Edema: None  Mental Status: Normal mood and affect. Normal behavior. Normal judgment and thought content.   Urinalysis:      Assessment and Plan:  Pregnancy: G1P0000 at [redacted]w[redacted]d  1. Pregnancy Routine care. Patient states some nausea but no vomiting and weight is stabilization. D/w her importance of PO hydration . Anatomy u/s already scheduled. D/w them re: AFP nv.   2. Alpha thalassemia trait D/w her re: this. FOB unsure. D/w her that peds likely will test newborn anyways. They would like GC to d/w them more re: this which is fine.  - AMB MFM GENETICS REFERRAL  Preterm labor symptoms  and general obstetric precautions including but not limited to vaginal bleeding, contractions, leaking of fluid and fetal movement were reviewed in detail with the patient. Please refer to After Visit Summary for other counseling recommendations.  Return in about 2 weeks (around 03/31/2017) for rob.   Hermiston Bing, MD

## 2017-04-06 ENCOUNTER — Ambulatory Visit (INDEPENDENT_AMBULATORY_CARE_PROVIDER_SITE_OTHER): Payer: Medicaid Other | Admitting: Family Medicine

## 2017-04-06 ENCOUNTER — Encounter: Payer: Self-pay | Admitting: Family Medicine

## 2017-04-06 VITALS — BP 116/77 | HR 79 | Wt 223.6 lb

## 2017-04-06 DIAGNOSIS — Z6841 Body Mass Index (BMI) 40.0 and over, adult: Secondary | ICD-10-CM

## 2017-04-06 DIAGNOSIS — Z3492 Encounter for supervision of normal pregnancy, unspecified, second trimester: Secondary | ICD-10-CM

## 2017-04-06 DIAGNOSIS — D563 Thalassemia minor: Secondary | ICD-10-CM

## 2017-04-06 NOTE — Patient Instructions (Addendum)
Second Trimester of Pregnancy The second trimester is from week 13 through week 28, month 4 through 6. This is often the time in pregnancy that you feel your best. Often times, morning sickness has lessened or quit. You may have more energy, and you may get hungry more often. Your unborn baby (fetus) is growing rapidly. At the end of the sixth month, he or she is about 9 inches long and weighs about 1 pounds. You will likely feel the baby move (quickening) between 18 and 20 weeks of pregnancy. Follow these instructions at home:  Avoid all smoking, herbs, and alcohol. Avoid drugs not approved by your doctor.  Do not use any tobacco products, including cigarettes, chewing tobacco, and electronic cigarettes. If you need help quitting, ask your doctor. You may get counseling or other support to help you quit.  Only take medicine as told by your doctor. Some medicines are safe and some are not during pregnancy.  Exercise only as told by your doctor. Stop exercising if you start having cramps.  Eat regular, healthy meals.  Wear a good support bra if your breasts are tender.  Do not use hot tubs, steam rooms, or saunas.  Wear your seat belt when driving.  Avoid raw meat, uncooked cheese, and liter boxes and soil used by cats.  Take your prenatal vitamins.  Take 1500-2000 milligrams of calcium daily starting at the 20th week of pregnancy until you deliver your baby.  Try taking medicine that helps you poop (stool softener) as needed, and if your doctor approves. Eat more fiber by eating fresh fruit, vegetables, and whole grains. Drink enough fluids to keep your pee (urine) clear or pale yellow.  Take warm water baths (sitz baths) to soothe pain or discomfort caused by hemorrhoids. Use hemorrhoid cream if your doctor approves.  If you have puffy, bulging veins (varicose veins), wear support hose. Raise (elevate) your feet for 15 minutes, 3-4 times a day. Limit salt in your diet.  Avoid heavy  lifting, wear low heals, and sit up straight.  Rest with your legs raised if you have leg cramps or low back pain.  Visit your dentist if you have not gone during your pregnancy. Use a soft toothbrush to brush your teeth. Be gentle when you floss.  You can have sex (intercourse) unless your doctor tells you not to.  Go to your doctor visits. Get help if:  You feel dizzy.  You have mild cramps or pressure in your lower belly (abdomen).  You have a nagging pain in your belly area.  You continue to feel sick to your stomach (nauseous), throw up (vomit), or have watery poop (diarrhea).  You have bad smelling fluid coming from your vagina.  You have pain with peeing (urination). Get help right away if:  You have a fever.  You are leaking fluid from your vagina.  You have spotting or bleeding from your vagina.  You have severe belly cramping or pain.  You lose or gain weight rapidly.  You have trouble catching your breath and have chest pain.  You notice sudden or extreme puffiness (swelling) of your face, hands, ankles, feet, or legs.  You have not felt the baby move in over an hour.  You have severe headaches that do not go away with medicine.  You have vision changes. This information is not intended to replace advice given to you by your health care provider. Make sure you discuss any questions you have with your health care   provider. Document Released: 02/23/2010 Document Revised: 05/06/2016 Document Reviewed: 01/30/2013 Elsevier Interactive Patient Education  2017 Elsevier [employer].   SAFE MEDICATIONS IN PREGNANCY  Acne:  Benzoyl Peroxide  Salicylic Acid   Backache/Headache:  Tylenol: 2 regular strength every 4 hours OR        2 Extra strength every 6 hours   Colds/Coughs/Allergies:  Benadryl (alcohol free) 25 mg every 6 hours as needed  Breath right strips  Claritin  Cepacol throat lozenges  Chloraseptic throat spray  Cold-Eeze- up to three times  per day  Cough drops, alcohol free  Flonase (by prescription only)  Guaifenesin  Mucinex  Robitussin DM (plain only, alcohol free)  Saline nasal spray/drops  Sudafed (pseudoephedrine) & Actifed * use only after [redacted] weeks gestation and if you do not have high blood pressure  Tylenol  Vicks Vaporub  Zinc lozenges  Zyrtec   Constipation:  Colace  Ducolax suppositories  Fleet enema  Glycerin suppositories  Metamucil  Milk of magnesia  Miralax  Senokot  Smooth move tea   Diarrhea:  Kaopectate  Imodium A-D   *NO pepto Bismol   Hemorrhoids:  Anusol  Anusol HC  Preparation H  Tucks   Indigestion:  Tums  Maalox  Mylanta  Zantac  Pepcid   Insomnia:  Benadryl (alcohol free) 25mg every 6 hours as needed  Tylenol PM  Unisom, no Gelcaps   Leg Cramps:  Tums  MagGel   Nausea/Vomiting:  Bonine  Dramamine  Emetrol  Ginger extract  Sea bands  Meclizine  Nausea medication to take during pregnancy:  Unisom (doxylamine succinate 25 mg tablets) Take one tablet daily at bedtime. If symptoms are not adequately controlled, the dose can be increased to a maximum recommended dose of two tablets daily (1/2 tablet in the morning, 1/2 tablet mid-afternoon and one at bedtime).  Vitamin B6 100mg tablets. Take one tablet twice a day (up to 200 mg per day).   Skin Rashes:  Aveeno products  Benadryl cream or 25mg every 6 hours as needed  Calamine Lotion  1% cortisone cream   Yeast infection:  Gyne-lotrimin 7  Monistat 7    **If taking multiple medications, please check labels to avoid duplicating the same active ingredients  **take medication as directed on the label  ** Do not exceed 4000 mg of tylenol in 24 hours  **Do not take medications that contain aspirin or ibuprofen           

## 2017-04-06 NOTE — Progress Notes (Signed)
      Subjective:  Jennifer Lynn is a 21 y.o. G1P0000 at [redacted]w[redacted]d being seen today for ongoing prenatal care.  She is currently monitored for the following issues for this low-risk pregnancy and has Morbid obesity with BMI of 40.0-44.9, adult (HCC); Supervision of low-risk pregnancy; and Alpha thalassemia trait on her problem list.   Patient reports no complaints. Interested in doing a waterbirth, educated on this type of birth but recommend to have a follow up appointment with a midwife next visit to go over this further. Contractions: Not present. Vag. Bleeding: None.   . Denies leaking of fluid.   The following portions of the patient's history were reviewed and updated as appropriate: allergies, current medications, past family history, past medical history, past social history, past surgical history and problem list. Problem list updated.  Objective:   Vitals:   04/06/17 1012  BP: 116/77  Pulse: 79  Weight: 223 lb 9.6 oz (101.4 kg)    Fetal Status: Fetal Heart Rate (bpm): 150          General:  Alert, oriented and cooperative. Patient is in no acute distress.  Skin: Skin is warm and dry. No rash noted.   Cardiovascular: Normal heart rate noted  Respiratory: Normal respiratory effort, no problems with respiration noted  Abdomen: Soft, gravid, appropriate for gestational age. Pain/Pressure: Present     Pelvic:  Cervical exam deferred        Extremities: Normal range of motion.  Edema: None  Mental Status: Normal mood and affect. Normal behavior. Normal judgment and thought content.   Urinalysis:      Assessment and Plan:  Pregnancy: G1P0000 at [redacted]w[redacted]d  1. Encounter for supervision of low-risk pregnancy in second trimester - Desires genetic screening, QUAD ordered today - AFP, Quad Screen  2. Alpha thalassemia trait - To see genetic counselor, FOB present today, plans to go and be tested  - AFP, Quad Screen  3. Morbid obesity with BMI of 40.0-44.9, adult (HCC)   MOC:  IUD MOF: Breastfeeding Preterm labor symptoms and general obstetric precautions including but not limited to vaginal bleeding, contractions, leaking of fluid and fetal movement were reviewed in detail with the patient. Please refer to After Visit Summary for other counseling recommendations.  Return in about 4 weeks (around 05/04/2017) for Routine OB visit - next appt with CNM for ?waterbirth.   Jen Mow, DO OB Fellow [facility]

## 2017-04-09 LAB — AFP, QUAD SCREEN
DIA Mom Value: 1.29
DIA VALUE (EIA): 171.04 pg/mL
DSR (By Age)    1 IN: 1106
DSR (SECOND TRIMESTER) 1 IN: 6581
Gestational Age: 17 WEEKS
MSAFP Mom: 1.04
MSAFP: 33.8 ng/mL
MSHCG MOM: 0.65
MSHCG: 16017 m[IU]/mL
Maternal Age At EDD: 22.9 yr
Osb Risk: 10000
Test Results:: NEGATIVE
UE3 MOM: 0.83
Weight: 223 [lb_av]
uE3 Value: 0.75 ng/mL

## 2017-04-20 ENCOUNTER — Other Ambulatory Visit (HOSPITAL_COMMUNITY): Payer: Self-pay | Admitting: Obstetrics and Gynecology

## 2017-04-20 ENCOUNTER — Encounter (HOSPITAL_COMMUNITY): Payer: Self-pay

## 2017-04-20 ENCOUNTER — Ambulatory Visit (HOSPITAL_COMMUNITY)
Admission: RE | Admit: 2017-04-20 | Discharge: 2017-04-20 | Disposition: A | Discharge status: Home / Self Care | Payer: Medicaid Other | Source: Ambulatory Visit | Attending: Medical | Admitting: Medical

## 2017-04-20 ENCOUNTER — Ambulatory Visit (HOSPITAL_COMMUNITY)
Admission: RE | Admit: 2017-04-20 | Discharge: 2017-04-20 | Disposition: A | Discharge status: Home / Self Care | Payer: Medicaid Other | Source: Ambulatory Visit | Attending: Obstetrics and Gynecology | Admitting: Obstetrics and Gynecology

## 2017-04-20 ENCOUNTER — Other Ambulatory Visit (HOSPITAL_COMMUNITY): Payer: Self-pay | Admitting: *Deleted

## 2017-04-20 DIAGNOSIS — Z3A19 19 weeks gestation of pregnancy: Secondary | ICD-10-CM | POA: Insufficient documentation

## 2017-04-20 DIAGNOSIS — O352XX0 Maternal care for (suspected) hereditary disease in fetus, not applicable or unspecified: Secondary | ICD-10-CM

## 2017-04-20 DIAGNOSIS — O285 Abnormal chromosomal and genetic finding on antenatal screening of mother: Secondary | ICD-10-CM | POA: Diagnosis not present

## 2017-04-20 DIAGNOSIS — Z6839 Body mass index (BMI) 39.0-39.9, adult: Secondary | ICD-10-CM | POA: Insufficient documentation

## 2017-04-20 DIAGNOSIS — Z363 Encounter for antenatal screening for malformations: Secondary | ICD-10-CM

## 2017-04-20 DIAGNOSIS — Z0489 Encounter for examination and observation for other specified reasons: Secondary | ICD-10-CM

## 2017-04-20 DIAGNOSIS — O99212 Obesity complicating pregnancy, second trimester: Secondary | ICD-10-CM | POA: Insufficient documentation

## 2017-04-20 DIAGNOSIS — O99210 Obesity complicating pregnancy, unspecified trimester: Secondary | ICD-10-CM

## 2017-04-20 DIAGNOSIS — IMO0002 Reserved for concepts with insufficient information to code with codable children: Secondary | ICD-10-CM

## 2017-04-20 NOTE — Progress Notes (Signed)
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  We discussed that this is commonly referred to as being a "silent" carrier for alpha thalassemia.  Carrier screening has not yet been performed for Jennifer Lynn alpha thalassemia or other hemoglobinopathies. Ms. THellingreported no additional relatives known to be alpha thalassemia carriers and no known relatives with alpha thalassemia.  Mr. Jennifer Shureported no known individuals with alpha thalassemia in his family history, and consanguinity to Jennifer Lynn was denied.   We reviewed genes, chromosomes, and autosomal recessive inheritance. Alpha thalassemia is different in its inheritance compared to other hemoglobinopathies as there are two alpha globin genes on each chromosome 16 (aa/aa).  A person can be a carrier of one alpha gene mutation (aa/a-), also referred to as a "silent carrier" or of more than one mutation.  A person who carries two alpha globin gene mutations can either carry them in cis (both on the same chromosome, denoted as aa/--) or in trans (on different chromosomes, denoted as a-/a-) .  We discussed that alpha thalassemia carriers of two mutations who have  African ancestry are more likely to have a trans arrangement (a-/a-), and individuals with Asian ancestry who are alpha thalassemia carriers usually have a cis arrangement (aa/--) of their alpha globin gene mutations.  Alpha-thalassemia carriers with cis configuration is reported to be rare in individuals with African ancestry. Given that Mr. Jennifer Shuhas no known family history of alpha thalassemia and no consanguinity to Jennifer Lynn, he would have the general population risk to be an alpha thalassemia carrier.    We discussed different forms of alpha thalassemia. The most severe form of alpha thalassemia, hydrops fetalis with Hb Barts, is associated with an absence of alpha globin chain synthesis as a result of deletions of all four alpha globin genes (--/--).  Given that Jennifer Lynn a silent carrier (aa/a-), her pregnancies would not be at increased risk for hemoglobin Bart hydrops fetalis, even if her partner is a carrier for alpha thalassemia. Hemoglobin H (HbH) disease is caused by three deleted or dysfunctioning alpha globin alleles (a-/--) and is characterized by microcytic hypochromic hemolytic anemia, hepatosplenomegaly, mild jaundice, and sometimes thalassemia-like bone changes. Given Jennifer Lynn silent carrier status (aa/a-), the current pregnancy would only be at risk for Hemoglobin H disease (a-/--), if Mr. Jennifer Shuis a carrier for two alpha globin mutations in cis (aa/--), in which case the risk for hemoglobin H disease in the pregnancy would be 1 in 4 (25%).   If Mr. Jennifer Shuis a carrier for two alpha globin mutations, we discussed that he would be more likely to carry them in trans configuration (a-/a-), given his ethnicity. If he is a carrier of alpha thalassemia in trans, then the pregnancy would not be  at risk for alpha thalassemia (hemoglobin H disease), but would have a 1 in 2 (50%) chance to carry alpha thalassemia in trans (a-/a-) and a 1 in 2 (50%) chance to be a silent carrier (aa/a-). If  Jennifer Lynn is also a silent carrier for alpha thalassemia (aa/a-), then the pregnancy would also not be at risk to inherit alpha thalassemia, but would have a 1 in 4 (25%) chance to be an alpha thalassemia carrier in trans (a-/a-), a 1 in 2 (50%) chance to be a silent alpha thalassemia carrier (aa/a-), and a 1 in 4 (25%) chance to not be a carrier. If Jennifer Lynn is not a carrier for alpha thalassemia, then the pregnancy would have a 1 in 2 (50%) chance to be a silent carrier, like Jennifer Lynn.    After reviewing this information and discussing these options, Jennifer Lynn declined carrier screening for alpha thalassemia.  They understand that ultrasound cannot rule out all birth defects or genetic syndromes.   Jennifer Lynn was provided with written information regarding cystic fibrosis (CF) and spinal muscular atrophy (SMA) including the carrier frequency, availability of carrier screening and prenatal diagnosis if indicated.  In addition, we discussed that CF and SMA are screened for as part of the [place] newborn screening panel, with SMA being an opt-in screen beginning in June.  After further discussion, she declined screening for CF and SMA.  Jennifer Lynn denied exposure to environmental toxins or chemical agents. She denied the use of alcohol, tobacco or street drugs. She denied significant viral illnesses during the course of her pregnancy. Her medical and surgical histories were noncontributory.   I counseled this couple regarding the above risks and available options.  The approximate face-to-face time with the genetic counselor was 30 minutes.  Jennifer Hai, MS Certified Genetic Counselor

## 2017-05-04 ENCOUNTER — Encounter: Payer: Medicaid Other | Admitting: Advanced Practice Midwife

## 2017-05-04 ENCOUNTER — Encounter: Payer: Self-pay | Admitting: Advanced Practice Midwife

## 2017-05-04 ENCOUNTER — Ambulatory Visit (INDEPENDENT_AMBULATORY_CARE_PROVIDER_SITE_OTHER): Payer: Medicaid Other | Admitting: Advanced Practice Midwife

## 2017-05-04 DIAGNOSIS — Z3402 Encounter for supervision of normal first pregnancy, second trimester: Secondary | ICD-10-CM

## 2017-05-04 DIAGNOSIS — Z3492 Encounter for supervision of normal pregnancy, unspecified, second trimester: Secondary | ICD-10-CM

## 2017-05-04 NOTE — Progress Notes (Signed)
   PRENATAL VISIT NOTE  Subjective:  Norm Saltrincess J Patient is a 21 y.o. G1P0000 at 2668w0d being seen today for ongoing prenatal care.  She is currently monitored for the following issues for this low-risk pregnancy and has Morbid obesity with BMI of 40.0-44.9, adult (HCC); Supervision of low-risk pregnancy; Alpha thalassemia trait; [redacted] weeks gestation of pregnancy; and Hereditary disease in family possibly affecting fetus, affecting management of mother, antepartum condition or complication, not applicable or unspecified fetus on her problem list.  Patient reports no complaints.  Contractions: Not present. Vag. Bleeding: None.  Movement: Present. Denies leaking of fluid.   The following portions of the patient's history were reviewed and updated as appropriate: allergies, current medications, past family history, past medical history, past social history, past surgical history and problem list. Problem list updated.  Objective:   Vitals:   05/04/17 1257  BP: 103/70  Pulse: 94  Weight: 222 lb 11.2 oz (101 kg)    Fetal Status: Fetal Heart Rate (bpm): 145   Movement: Present     General:  Alert, oriented and cooperative. Patient is in no acute distress.  Skin: Skin is warm and dry. No rash noted.   Cardiovascular: Normal heart rate noted  Respiratory: Normal respiratory effort, no problems with respiration noted  Abdomen: Soft, gravid, appropriate for gestational age. Pain/Pressure: Present     Pelvic:  Cervical exam deferred        Extremities: Normal range of motion.  Edema: Trace  Mental Status: Normal mood and affect. Normal behavior. Normal judgment and thought content.   Assessment and Plan:  Pregnancy: G1P0000 at 7368w0d  1. Encounter for supervision of low-risk pregnancy in second trimester      Discussed waterbirth issues.  States she does not tolerate pain well, but may want a waterbirth. Reviewed general guidelines.  She will think about it.  Did not attend class yet. Will call to  schedule  Preterm labor symptoms and general obstetric precautions including but not limited to vaginal bleeding, contractions, leaking of fluid and fetal movement were reviewed in detail with the patient. Please refer to After Visit Summary for other counseling recommendations.  RTO 4 weeks  Wynelle BourgeoisMarie Tamekia Rotter, CNM

## 2017-05-04 NOTE — Patient Instructions (Signed)

## 2017-05-18 ENCOUNTER — Ambulatory Visit (HOSPITAL_COMMUNITY)
Admission: RE | Admit: 2017-05-18 | Discharge: 2017-05-18 | Disposition: A | Discharge status: Home / Self Care | Payer: Medicaid Other | Source: Ambulatory Visit | Attending: Medical | Admitting: Medical

## 2017-05-18 ENCOUNTER — Other Ambulatory Visit (HOSPITAL_COMMUNITY): Payer: Self-pay | Admitting: Obstetrics and Gynecology

## 2017-05-18 ENCOUNTER — Encounter (HOSPITAL_COMMUNITY): Payer: Self-pay

## 2017-05-18 DIAGNOSIS — Z0489 Encounter for examination and observation for other specified reasons: Secondary | ICD-10-CM

## 2017-05-18 DIAGNOSIS — O285 Abnormal chromosomal and genetic finding on antenatal screening of mother: Secondary | ICD-10-CM | POA: Diagnosis not present

## 2017-05-18 DIAGNOSIS — Z3A23 23 weeks gestation of pregnancy: Secondary | ICD-10-CM | POA: Diagnosis not present

## 2017-05-18 DIAGNOSIS — IMO0002 Reserved for concepts with insufficient information to code with codable children: Secondary | ICD-10-CM

## 2017-05-18 DIAGNOSIS — O99212 Obesity complicating pregnancy, second trimester: Secondary | ICD-10-CM

## 2017-05-18 DIAGNOSIS — Z362 Encounter for other antenatal screening follow-up: Secondary | ICD-10-CM | POA: Diagnosis not present

## 2017-05-30 ENCOUNTER — Encounter: Payer: Medicaid Other | Admitting: Advanced Practice Midwife

## 2017-05-31 ENCOUNTER — Encounter: Payer: Self-pay | Admitting: Obstetrics and Gynecology

## 2017-05-31 ENCOUNTER — Ambulatory Visit (INDEPENDENT_AMBULATORY_CARE_PROVIDER_SITE_OTHER): Payer: Medicaid Other | Admitting: Obstetrics and Gynecology

## 2017-05-31 VITALS — BP 125/70 | HR 100 | Wt 229.7 lb

## 2017-05-31 DIAGNOSIS — D563 Thalassemia minor: Secondary | ICD-10-CM

## 2017-05-31 DIAGNOSIS — Z3493 Encounter for supervision of normal pregnancy, unspecified, third trimester: Secondary | ICD-10-CM

## 2017-05-31 NOTE — Progress Notes (Signed)
   PRENATAL VISIT NOTE  Subjective:  Jennifer Lynn is a 21 y.o. G1P0000 at 1757w6d being seen today for ongoing prenatal care.  She is currently monitored for the following issues for this high-risk pregnancy and has Morbid obesity with BMI of 40.0-44.9, adult (HCC); Supervision of low-risk pregnancy; Alpha thalassemia trait; [redacted] weeks gestation of pregnancy; and Hereditary disease in family possibly affecting fetus, affecting management of mother, antepartum condition or complication, not applicable or unspecified fetus on her problem list.  Patient reports no complaints.  Contractions: Not present. Vag. Bleeding: None.  Movement: Present. Denies leaking of fluid.   The following portions of the patient's history were reviewed and updated as appropriate: allergies, current medications, past family history, past medical history, past social history, past surgical history and problem list. Problem list updated.  Objective:   Vitals:   05/31/17 1305  BP: 125/70  Pulse: 100  Weight: 229 lb 11.2 oz (104.2 kg)    Fetal Status: Fetal Heart Rate (bpm): 145 Fundal Height: 25 cm Movement: Present     General:  Alert, oriented and cooperative. Patient is in no acute distress.  Skin: Skin is warm and dry. No rash noted.   Cardiovascular: Normal heart rate noted  Respiratory: Normal respiratory effort, no problems with respiration noted  Abdomen: Soft, gravid, appropriate for gestational age. Pain/Pressure: Absent     Pelvic:  Cervical exam deferred        Extremities: Normal range of motion.  Edema: Trace  Mental Status: Normal mood and affect. Normal behavior. Normal judgment and thought content.   Assessment and Plan:  Pregnancy: G1P0000 at 9657w6d  1. Encounter for supervision of low-risk pregnancy in third trimester  - Recent US discussed  - Still considering water birth.   2. Alpha thalassemia trait  FOB to be tested   There are no diagnoses linked to this encounter. Preterm  labor symptoms and general obstetric precautions including but not limited to vaginal bleeding, contractions, leaking of fluid and fetal movement were reviewed in detail with the patient. Please refer to After Visit Summary for other counseling recommendations.  Return in about 4 weeks (around 06/28/2017).     Venia Carbonasch, Kihanna Kamiya I, NP 05/31/2017 2:01 PM

## 2017-06-28 ENCOUNTER — Ambulatory Visit (INDEPENDENT_AMBULATORY_CARE_PROVIDER_SITE_OTHER): Payer: Medicaid Other | Admitting: Medical

## 2017-06-28 VITALS — BP 116/57 | HR 84 | Wt 229.0 lb

## 2017-06-28 DIAGNOSIS — Z3403 Encounter for supervision of normal first pregnancy, third trimester: Secondary | ICD-10-CM

## 2017-06-28 DIAGNOSIS — Z3480 Encounter for supervision of other normal pregnancy, unspecified trimester: Secondary | ICD-10-CM

## 2017-06-28 DIAGNOSIS — Z23 Encounter for immunization: Secondary | ICD-10-CM

## 2017-06-28 DIAGNOSIS — Z3493 Encounter for supervision of normal pregnancy, unspecified, third trimester: Secondary | ICD-10-CM

## 2017-06-28 MED ORDER — TETANUS-DIPHTH-ACELL PERTUSSIS 5-2.5-18.5 LF-MCG/0.5 IM SUSP: 0.5000 mL | Freq: Once | INTRAMUSCULAR | Status: DC

## 2017-06-28 NOTE — Progress Notes (Signed)
   PRENATAL VISIT NOTE  Subjective:  Jennifer Lynn is a 21 y.o. G1P0000 at 7133w6d being seen today for ongoing prenatal care.  She is currently monitored for the following issues for this low-risk pregnancy and has Morbid obesity with BMI of 40.0-44.9, adult (HCC); Supervision of low-risk pregnancy; Alpha thalassemia trait; and Hereditary disease in family possibly affecting fetus, affecting management of mother, antepartum condition or complication, not applicable or unspecified fetus on her problem list.  Patient reports no complaints.  Contractions: Not present. Vag. Bleeding: None.  Movement: Present. Denies leaking of fluid.   The following portions of the patient's history were reviewed and updated as appropriate: allergies, current medications, past family history, past medical history, past social history, past surgical history and problem list. Problem list updated.  Objective:   Vitals:   06/28/17 1250  BP: (!) 116/57  Pulse: 84  Weight: 229 lb (103.9 kg)    Fetal Status: Fetal Heart Rate (bpm): 147 Fundal Height: 29 cm Movement: Present     General:  Alert, oriented and cooperative. Patient is in no acute distress.  Skin: Skin is warm and dry. No rash noted.   Cardiovascular: Normal heart rate noted  Respiratory: Normal respiratory effort, no problems with respiration noted  Abdomen: Soft, gravid, appropriate for gestational age.  Pain/Pressure: Present     Pelvic: Cervical exam deferred        Extremities: Normal range of motion.  Edema: Trace  Mental Status:  Normal mood and affect. Normal behavior. Normal judgment and thought content.   Assessment and Plan:  Pregnancy: G1P0000 at 9933w6d  1. Supervision of other normal pregnancy, antepartum - CBC - RPR - HIV antibody (with reflex) - Glucose Tolerance, 2 Hours w/1 Hour  2. Need for Tdap vaccination - Tdap (BOOSTRIX) injection 0.5 mL; Inject 0.5 mLs into the muscle once.  3. Encounter for supervision of  low-risk pregnancy in third trimester - Doing well - Concerned about white film on tongue. Does not resemble thrush. HIV test today. 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 Will monitor - Advised of concerning symptoms for anaphylaxis and when to present to MAU/ED  Preterm labor symptoms and general obstetric precautions including but not limited to vaginal bleeding, contractions, leaking of fluid and fetal movement were reviewed in detail with the patient. Please refer to After Visit Summary for other counseling recommendations.  Return in about 2 weeks (around 07/12/2017) for LOB.   Vonzella NippleJulie Aggie Douse, PA-C

## 2017-06-28 NOTE — Progress Notes (Signed)
28 week labs today,

## 2017-06-28 NOTE — Patient Instructions (Signed)
Fetal Movement Counts °Patient Name: ________________________________________________ Patient Due Date: ____________________ °What is a fetal movement count? °A fetal movement count is the number of times that you feel your baby move during a certain amount of time. This may also be called a fetal kick count. A fetal movement count is recommended for every pregnant woman. You may be asked to start counting fetal movements as early as week 28 of your pregnancy. °Pay attention to when your baby is most active. You may notice your baby's sleep and wake cycles. You may also notice things that make your baby move more. You should do a fetal movement count: °· When your baby is normally most active. °· At the same time each day. ° °A good time to count movements is while you are resting, after having something to eat and drink. °How do I count fetal movements? °1. Find a quiet, comfortable area. Sit, or lie down on your side. °2. Write down the date, the start time and stop time, and the number of movements that you felt between those two times. Take this information with you to your health care visits. °3. For 2 hours, count kicks, flutters, swishes, rolls, and jabs. You should feel at least 10 movements during 2 hours. °4. You may stop counting after you have felt 10 movements. °5. If you do not feel 10 movements in 2 hours, have something to eat and drink. Then, keep resting and counting for 1 hour. If you feel at least 4 movements during that hour, you may stop counting. °Contact a health care provider if: °· You feel fewer than 4 movements in 2 hours. °· Your baby is not moving like he or she usually does. °Date: ____________ Start time: ____________ Stop time: ____________ Movements: ____________ °Date: ____________ Start time: ____________ Stop time: ____________ Movements: ____________ °Date: ____________ Start time: ____________ Stop time: ____________ Movements: ____________ °Date: ____________ Start time:  ____________ Stop time: ____________ Movements: ____________ °Date: ____________ Start time: ____________ Stop time: ____________ Movements: ____________ °Date: ____________ Start time: ____________ Stop time: ____________ Movements: ____________ °Date: ____________ Start time: ____________ Stop time: ____________ Movements: ____________ °Date: ____________ Start time: ____________ Stop time: ____________ Movements: ____________ °Date: ____________ Start time: ____________ Stop time: ____________ Movements: ____________ °This information is not intended to replace advice given to you by your health care provider. Make sure you discuss any questions you have with your health care provider. °Document Released: 12/29/2006 Document Revised: 07/28/2016 Document Reviewed: 01/08/2016 °Elsevier Interactive Patient Education © 2018 Elsevier Inc[employer] °Braxton Hicks Contractions °Contractions of the uterus can occur throughout pregnancy, but they are not always a sign that you are in labor. You may have practice contractions called Braxton Hicks contractions. These false labor contractions are sometimes confused with true labor. °What are Braxton Hicks contractions? °Braxton Hicks contractions are tightening movements that occur in the muscles of the uterus before labor. Unlike true labor contractions, these contractions do not result in opening (dilation) and thinning of the cervix. Toward the end of pregnancy (32-34 weeks), Braxton Hicks contractions can happen more often and may become stronger. These contractions are sometimes difficult to tell apart from true labor because they can be very uncomfortable. You should not feel embarrassed if you go to the hospital with false labor. °Sometimes, the only way to tell if you are in true labor is for your health care provider to look for changes in the cervix. The health care provider will do a physical exam and may monitor your contractions. If   you are not in true labor, the exam  should show that your cervix is not dilating and your water has not broken. °If there are no prenatal problems or other health problems associated with your pregnancy, it is completely safe for you to be sent home with false labor. You may continue to have Braxton Hicks contractions until you go into true labor. °How can I tell the difference between true labor and false labor? °· Differences °? False labor °? Contractions last 30-70 seconds.: Contractions are usually shorter and not as strong as true labor contractions. °? Contractions become very regular.: Contractions are usually irregular. °? Discomfort is usually felt in the top of the uterus, and it spreads to the lower abdomen and low back.: Contractions are often felt in the front of the lower abdomen and in the groin. °? Contractions do not go away with walking.: Contractions may go away when you walk around or change positions while lying down. °? Contractions usually become more intense and increase in frequency.: Contractions get weaker and are shorter-lasting as time goes on. °? The cervix dilates and gets thinner.: The cervix usually does not dilate or become thin. °Follow these instructions at home: °· Take over-the-counter and prescription medicines only as told by your health care provider. °· Keep up with your usual exercises and follow other instructions from your health care provider. °· Eat and drink lightly if you think you are going into labor. °· If Braxton Hicks contractions are making you uncomfortable: °? Change your position from lying down or resting to walking, or change from walking to resting. °? Sit and rest in a tub of warm water. °? Drink enough fluid to keep your urine clear or pale yellow. Dehydration may cause these contractions. °? Do slow and deep breathing several times an hour. °· Keep all follow-up prenatal visits as told by your health care provider. This is important. °Contact a health care provider if: °· You have a  fever. °· You have continuous pain in your abdomen. °Get help right away if: °· Your contractions become stronger, more regular, and closer together. °· You have fluid leaking or gushing from your vagina. °· You pass blood-tinged mucus (bloody show). °· You have bleeding from your vagina. °· You have low back pain that you never had before. °· You feel your baby’s head pushing down and causing pelvic pressure. °· Your baby is not moving inside you as much as it used to. °Summary °· Contractions that occur before labor are called Braxton Hicks contractions, false labor, or practice contractions. °· Braxton Hicks contractions are usually shorter, weaker, farther apart, and less regular than true labor contractions. True labor contractions usually become progressively stronger and regular and they become more frequent. °· Manage discomfort from Braxton Hicks contractions by changing position, resting in a warm bath, drinking plenty of water, or practicing deep breathing. °This information is not intended to replace advice given to you by your health care provider. Make sure you discuss any questions you have with your health care provider. °Document Released: 11/29/2005 Document Revised: 10/18/2016 Document Reviewed: 10/18/2016 °Elsevier Interactive Patient Education © 2017 Elsevier Inc. ° °

## 2017-06-29 LAB — HIV ANTIBODY (ROUTINE TESTING W REFLEX): HIV SCREEN 4TH GENERATION: NONREACTIVE

## 2017-06-29 LAB — CBC
HEMATOCRIT: 29.9 % — AB (ref 34.0–46.6)
HEMOGLOBIN: 9.6 g/dL — AB (ref 11.1–15.9)
MCH: 25.7 pg — ABNORMAL LOW (ref 26.6–33.0)
MCHC: 32.1 g/dL (ref 31.5–35.7)
MCV: 80 fL (ref 79–97)
Platelets: 244 10*3/uL (ref 150–379)
RBC: 3.74 x10E6/uL — ABNORMAL LOW (ref 3.77–5.28)
RDW: 15.4 % (ref 12.3–15.4)
WBC: 7.4 10*3/uL (ref 3.4–10.8)

## 2017-06-29 LAB — GLUCOSE TOLERANCE, 2 HOURS W/ 1HR
GLUCOSE, 2 HOUR: 111 mg/dL (ref 65–152)
GLUCOSE, FASTING: 72 mg/dL (ref 65–91)
Glucose, 1 hour: 132 mg/dL (ref 65–179)

## 2017-06-29 LAB — RPR: RPR Ser Ql: NONREACTIVE

## 2017-06-30 ENCOUNTER — Other Ambulatory Visit: Payer: Self-pay | Admitting: Medical

## 2017-07-05 ENCOUNTER — Telehealth: Payer: Self-pay | Admitting: Lab

## 2017-07-05 DIAGNOSIS — Z3491 Encounter for supervision of normal pregnancy, unspecified, first trimester: Secondary | ICD-10-CM

## 2017-07-05 MED ORDER — FERROUS SULFATE 325 (65 FE) MG PO TABS: 325.0000 mg | ORAL_TABLET | Freq: Every day | ORAL | 3 refills | Status: DC

## 2017-07-05 NOTE — Telephone Encounter (Signed)
Called Patient to tell her that her iron level results were low and she should start taking iron pills. Also I updated her  prescription for the iron medication.

## 2017-07-12 ENCOUNTER — Encounter: Payer: Medicaid Other | Admitting: Student

## 2017-07-12 ENCOUNTER — Ambulatory Visit (INDEPENDENT_AMBULATORY_CARE_PROVIDER_SITE_OTHER): Payer: Medicaid Other | Admitting: Student

## 2017-07-12 VITALS — BP 113/73 | HR 91 | Wt 232.7 lb

## 2017-07-12 DIAGNOSIS — Z3493 Encounter for supervision of normal pregnancy, unspecified, third trimester: Secondary | ICD-10-CM

## 2017-07-12 NOTE — Progress Notes (Signed)
   PRENATAL VISIT NOTE  Subjective:  Jennifer Lynn is a 21 y.o. G1P0000 at 2364w6d being seen today for ongoing prenatal care.  She is currently monitored for the following issues for this low-risk pregnancy and has Morbid obesity with BMI of 40.0-44.9, adult (HCC); Supervision of low-risk pregnancy; Alpha thalassemia trait; and Hereditary disease in family possibly affecting fetus, affecting management of mother, antepartum condition or complication, not applicable or unspecified fetus on her problem list.  Patient reports no complaints.  Contractions: Not present. Vag. Bleeding: None.  Movement: Present. Denies leaking of fluid.   The following portions of the patient's history were reviewed and updated as appropriate: allergies, current medications, past family history, past medical history, past social history, past surgical history and problem list. Problem list updated.  Objective:   Vitals:   07/12/17 1246  BP: 113/73  Pulse: 91  Weight: 232 lb 11.2 oz (105.6 kg)    Fetal Status: Fetal Heart Rate (bpm): 140 Fundal Height: 32 cm Movement: Present     General:  Alert, oriented and cooperative. Patient is in no acute distress.  Skin: Skin is warm and dry. No rash noted.   Cardiovascular: Normal heart rate noted  Respiratory: Normal respiratory effort, no problems with respiration noted  Abdomen: Soft, gravid, appropriate for gestational age.  Pain/Pressure: Absent     Pelvic: Cervical exam deferred        Extremities: Normal range of motion.  Edema: Trace  Mental Status:  Normal mood and affect. Normal behavior. Normal judgment and thought content.   Assessment and Plan:  Pregnancy: G1P0000 at 2364w6d  1. Encounter for supervision of low-risk pregnancy in third trimester -pt still interested in water birth. Given WB info today. Plans on going to WB class  Preterm labor symptoms and general obstetric precautions including but not limited to vaginal bleeding, contractions,  leaking of fluid and fetal movement were reviewed in detail with the patient. Please refer to After Visit Summary for other counseling recommendations.  Return in about 2 weeks (around 07/26/2017) for Routine OB.   Judeth HornErin Amal Renbarger, NP

## 2017-07-12 NOTE — Patient Instructions (Signed)
Thinking About Doren Custard???  You must attend a Doren Custard class at [facility]  3rd Wednesday of every month from 7-9pm  Free  [employer] by calling [phone] or online at [email or web address]  Bring [place] the certificate from the class  Waterbirth supplies needed for [facility] patients:  Our practice has a Heritage manager in a Box tub at the hospital that you can borrow  You will need to purchase an accessory kit that has all needed supplies through [facility] [phone]) or online $175.00  Or you can purchase the supplies separately: o Single-use disposable tub liner for Birth Pool in a Box (REGULAR size) o New garden hose labeled "lead-free", "suitable for drinking water", o Electric drain pump to remove water (We recommend 792 gallon per hour or greater pump.)  o  "non-toxic" OR "water potable" o Garden hose to remove the dirty water o Fish net o Bathing suit top (optional) o Long-handled mirror (optional)  [employer] sells tubs for ~ $120 if you would rather purchase your own tub.  They also sell accessories, liners.    Www.waterbirthsolutions.com for tub purchases and supplies  [employer] (www.thelaborladies.com) $275 for tub rental/set-up & take down/kit   [employer] information regarding doulas (labor support) who provide pool rentals:  IdentityList.se.htm   [employer] (www.thelaborladies.com)  IdentityList.se.htm   Things that would prevent you from having a waterbirth:  Premature, <37wks  Previous cesarean birth  Presence of thick meconium-stained fluid  Multiple gestation (Twins, triplets, etc.)  Uncontrolled diabetes or gestational diabetes requiring medication  Hypertension  Heavy vaginal bleeding  Non-reassuring fetal heart rate  Active infection (MRSA, etc.)  If your labor has to be induced and induction  method requires continuous monitoring of the baby's heart rate  Other risks/issues identified by your obstetrical provider

## 2017-07-25 ENCOUNTER — Ambulatory Visit (INDEPENDENT_AMBULATORY_CARE_PROVIDER_SITE_OTHER): Payer: Medicaid Other | Admitting: Obstetrics and Gynecology

## 2017-07-25 VITALS — BP 109/79 | HR 104 | Wt 232.1 lb

## 2017-07-25 DIAGNOSIS — Z3493 Encounter for supervision of normal pregnancy, unspecified, third trimester: Secondary | ICD-10-CM

## 2017-07-25 NOTE — Progress Notes (Signed)
Subjective:  Jennifer Lynn is a 21 y.o. G1P0000 at 6928w5d being seen today for ongoing prenatal care.  She is currently monitored for the following issues for this low-risk pregnancy and has Morbid obesity with BMI of 40.0-44.9, adult (HCC); Supervision of low-risk pregnancy; Alpha thalassemia trait; and Hereditary disease in family possibly affecting fetus, affecting management of mother, antepartum condition or complication, not applicable or unspecified fetus on her problem list.  Patient reports no complaints.  Contractions: Irregular. Vag. Bleeding: None.  Movement: Present. Denies leaking of fluid.   The following portions of the patient's history were reviewed and updated as appropriate: allergies, current medications, past family history, past medical history, past social history, past surgical history and problem list. Problem list updated.  Objective:   Vitals:   07/25/17 1601  BP: 109/79  Pulse: (!) 104  Weight: 232 lb 1.6 oz (105.3 kg)    Fetal Status: Fetal Heart Rate (bpm): 148 Fundal Height: 34 cm Movement: Present     General:  Alert, oriented and cooperative. Patient is in no acute distress.  Skin: Skin is warm and dry. No rash noted.   Cardiovascular: Normal heart rate noted  Respiratory: Normal respiratory effort, no problems with respiration noted  Abdomen: Soft, gravid, appropriate for gestational age. Pain/Pressure: Present     Pelvic: Vag. Bleeding: None Vag D/C Character: White   Cervical exam deferred        Extremities: Normal range of motion.  Edema: Trace  Mental Status: Normal mood and affect. Normal behavior. Normal judgment and thought content.    Assessment and Plan:  Pregnancy: G1P0000 at 4728w5d  1. Encounter for supervision of low-risk pregnancy in third trimester Continue routine prenatal care in low-risk patient. Discussed Braxton-Hicks contractions and preterm labor. Patient no longer wants water birth.   Preterm labor symptoms and general  obstetric precautions including but not limited to vaginal bleeding, contractions, leaking of fluid and fetal movement were reviewed in detail with the patient. Please refer to After Visit Summary for other counseling recommendations.  Return in about 2 weeks (around 08/08/2017) for ob visit.   Caryl AdaJazma Daneisha Surges, DO OB Fellow Faculty [facility], [facility] 07/25/2017, 4:18 PM

## 2017-07-25 NOTE — Patient Instructions (Signed)

## 2017-08-09 ENCOUNTER — Ambulatory Visit (INDEPENDENT_AMBULATORY_CARE_PROVIDER_SITE_OTHER): Payer: Medicaid Other | Admitting: Medical

## 2017-08-09 VITALS — BP 115/62 | HR 69 | Wt 229.3 lb

## 2017-08-09 DIAGNOSIS — K649 Unspecified hemorrhoids: Secondary | ICD-10-CM

## 2017-08-09 DIAGNOSIS — O368130 Decreased fetal movements, third trimester, not applicable or unspecified: Secondary | ICD-10-CM

## 2017-08-09 DIAGNOSIS — Z3493 Encounter for supervision of normal pregnancy, unspecified, third trimester: Secondary | ICD-10-CM

## 2017-08-09 NOTE — Patient Instructions (Signed)
Fetal Movement Counts °Patient Name: ________________________________________________ Patient Due Date: ____________________ °What is a fetal movement count? °A fetal movement count is the number of times that you feel your baby move during a certain amount of time. This may also be called a fetal kick count. A fetal movement count is recommended for every pregnant woman. You may be asked to start counting fetal movements as early as week 28 of your pregnancy. °Pay attention to when your baby is most active. You may notice your baby's sleep and wake cycles. You may also notice things that make your baby move more. You should do a fetal movement count: °· When your baby is normally most active. °· At the same time each day. ° °A good time to count movements is while you are resting, after having something to eat and drink. °How do I count fetal movements? °1. Find a quiet, comfortable area. Sit, or lie down on your side. °2. Write down the date, the start time and stop time, and the number of movements that you felt between those two times. Take this information with you to your health care visits. °3. For 2 hours, count kicks, flutters, swishes, rolls, and jabs. You should feel at least 10 movements during 2 hours. °4. You may stop counting after you have felt 10 movements. °5. If you do not feel 10 movements in 2 hours, have something to eat and drink. Then, keep resting and counting for 1 hour. If you feel at least 4 movements during that hour, you may stop counting. °Contact a health care provider if: °· You feel fewer than 4 movements in 2 hours. °· Your baby is not moving like he or she usually does. °Date: ____________ Start time: ____________ Stop time: ____________ Movements: ____________ °Date: ____________ Start time: ____________ Stop time: ____________ Movements: ____________ °Date: ____________ Start time: ____________ Stop time: ____________ Movements: ____________ °Date: ____________ Start time:  ____________ Stop time: ____________ Movements: ____________ °Date: ____________ Start time: ____________ Stop time: ____________ Movements: ____________ °Date: ____________ Start time: ____________ Stop time: ____________ Movements: ____________ °Date: ____________ Start time: ____________ Stop time: ____________ Movements: ____________ °Date: ____________ Start time: ____________ Stop time: ____________ Movements: ____________ °Date: ____________ Start time: ____________ Stop time: ____________ Movements: ____________ °This information is not intended to replace advice given to you by your health care provider. Make sure you discuss any questions you have with your health care provider. °Document Released: 12/29/2006 Document Revised: 07/28/2016 Document Reviewed: 01/08/2016 °Elsevier Interactive Patient Education © 2018 Elsevier Inc[employer] °Braxton Hicks Contractions °Contractions of the uterus can occur throughout pregnancy, but they are not always a sign that you are in labor. You may have practice contractions called Braxton Hicks contractions. These false labor contractions are sometimes confused with true labor. °What are Braxton Hicks contractions? °Braxton Hicks contractions are tightening movements that occur in the muscles of the uterus before labor. Unlike true labor contractions, these contractions do not result in opening (dilation) and thinning of the cervix. Toward the end of pregnancy (32-34 weeks), Braxton Hicks contractions can happen more often and may become stronger. These contractions are sometimes difficult to tell apart from true labor because they can be very uncomfortable. You should not feel embarrassed if you go to the hospital with false labor. °Sometimes, the only way to tell if you are in true labor is for your health care provider to look for changes in the cervix. The health care provider will do a physical exam and may monitor your contractions. If   you are not in true labor, the exam  should show that your cervix is not dilating and your water has not broken. °If there are no prenatal problems or other health problems associated with your pregnancy, it is completely safe for you to be sent home with false labor. You may continue to have Braxton Hicks contractions until you go into true labor. °How can I tell the difference between true labor and false labor? °· Differences °? False labor °? Contractions last 30-70 seconds.: Contractions are usually shorter and not as strong as true labor contractions. °? Contractions become very regular.: Contractions are usually irregular. °? Discomfort is usually felt in the top of the uterus, and it spreads to the lower abdomen and low back.: Contractions are often felt in the front of the lower abdomen and in the groin. °? Contractions do not go away with walking.: Contractions may go away when you walk around or change positions while lying down. °? Contractions usually become more intense and increase in frequency.: Contractions get weaker and are shorter-lasting as time goes on. °? The cervix dilates and gets thinner.: The cervix usually does not dilate or become thin. °Follow these instructions at home: °· Take over-the-counter and prescription medicines only as told by your health care provider. °· Keep up with your usual exercises and follow other instructions from your health care provider. °· Eat and drink lightly if you think you are going into labor. °· If Braxton Hicks contractions are making you uncomfortable: °? Change your position from lying down or resting to walking, or change from walking to resting. °? Sit and rest in a tub of warm water. °? Drink enough fluid to keep your urine clear or pale yellow. Dehydration may cause these contractions. °? Do slow and deep breathing several times an hour. °· Keep all follow-up prenatal visits as told by your health care provider. This is important. °Contact a health care provider if: °· You have a  fever. °· You have continuous pain in your abdomen. °Get help right away if: °· Your contractions become stronger, more regular, and closer together. °· You have fluid leaking or gushing from your vagina. °· You pass blood-tinged mucus (bloody show). °· You have bleeding from your vagina. °· You have low back pain that you never had before. °· You feel your baby’s head pushing down and causing pelvic pressure. °· Your baby is not moving inside you as much as it used to. °Summary °· Contractions that occur before labor are called Braxton Hicks contractions, false labor, or practice contractions. °· Braxton Hicks contractions are usually shorter, weaker, farther apart, and less regular than true labor contractions. True labor contractions usually become progressively stronger and regular and they become more frequent. °· Manage discomfort from Braxton Hicks contractions by changing position, resting in a warm bath, drinking plenty of water, or practicing deep breathing. °This information is not intended to replace advice given to you by your health care provider. Make sure you discuss any questions you have with your health care provider. °Document Released: 11/29/2005 Document Revised: 10/18/2016 Document Reviewed: 10/18/2016 °Elsevier Interactive Patient Education © 2017 Elsevier Inc. ° °

## 2017-08-09 NOTE — Progress Notes (Signed)
   PRENATAL VISIT NOTE  Subjective:  Jennifer Lynn is a 21 y.o. G1P0000 at [redacted]w[redacted]d being seen today for ongoing prenatal care.  She is currently monitored for the following issues for this low-risk pregnancy and has Morbid obesity with BMI of 40.0-44.9, adult (HCC); Supervision of low-risk pregnancy; Alpha thalassemia trait; and Hereditary disease in family possibly affecting fetus, affecting management of mother, antepartum condition or complication, not applicable or unspecified fetus on her problem list.  Patient reports decreased fetal movement.  Contractions: Irregular. Vag. Bleeding: None.  Movement: (!) Decreased. Denies leaking of fluid.   The following portions of the patient's history were reviewed and updated as appropriate: allergies, current medications, past family history, past medical history, past social history, past surgical history and problem list. Problem list updated.  Objective:   Vitals:   08/09/17 0950  BP: 115/62  Pulse: 69  Weight: 229 lb 4.8 oz (104 kg)    Fetal Status: Fetal Heart Rate (bpm): 140 Fundal Height: 36 cm Movement: (!) Decreased     General:  Alert, oriented and cooperative. Patient is in no acute distress.  Skin: Skin is warm and dry. No rash noted.   Cardiovascular: Normal heart rate noted  Respiratory: Normal respiratory effort, no problems with respiration noted  Abdomen: Soft, gravid, appropriate for gestational age.  Pain/Pressure: Present     Pelvic: Cervical exam deferred        Extremities: Normal range of motion.  Edema: None  Mental Status:  Normal mood and affect. Normal behavior. Normal judgment and thought content.   Assessment and Plan:  Pregnancy: G1P0000 at [redacted]w[redacted]d  1. Encounter for supervision of low-risk pregnancy in third trimester - Doing well, no contractions, vaginal bleeding, LOF - Decreased fetal movement, palpating movement only 1-2x/day and requires poking her abdomen - NST ordered today   2. Hemorrhoids,  unspecified hemorrhoid type - CBC - Discussed treatment options for reducing swelling - May require general surgery consult after delivery   Preterm labor symptoms and general obstetric precautions including but not limited to vaginal bleeding, contractions, leaking of fluid and fetal movement were reviewed in detail with the patient. Please refer to After Visit Summary for other counseling recommendations.  Return in about 1 week (around 08/16/2017) for LOB.   Vonzella Nipple, PA-C

## 2017-08-09 NOTE — Progress Notes (Signed)
States that baby is moving once a day.

## 2017-08-22 ENCOUNTER — Other Ambulatory Visit (HOSPITAL_COMMUNITY)
Admission: RE | Admit: 2017-08-22 | Discharge: 2017-08-22 | Disposition: A | Discharge status: Home / Self Care | Payer: Medicaid Other | Source: Ambulatory Visit | Attending: Medical | Admitting: Medical

## 2017-08-22 ENCOUNTER — Ambulatory Visit (INDEPENDENT_AMBULATORY_CARE_PROVIDER_SITE_OTHER): Payer: Medicaid Other | Admitting: Medical

## 2017-08-22 VITALS — BP 127/79 | HR 84 | Wt 235.9 lb

## 2017-08-22 DIAGNOSIS — Z3403 Encounter for supervision of normal first pregnancy, third trimester: Secondary | ICD-10-CM

## 2017-08-22 DIAGNOSIS — Z113 Encounter for screening for infections with a predominantly sexual mode of transmission: Secondary | ICD-10-CM | POA: Diagnosis not present

## 2017-08-22 DIAGNOSIS — Z3493 Encounter for supervision of normal pregnancy, unspecified, third trimester: Secondary | ICD-10-CM | POA: Insufficient documentation

## 2017-08-22 NOTE — Progress Notes (Signed)
   PRENATAL VISIT NOTE  Subjective:  Jennifer Lynn is a 21 y.o. G1P0000 at 6088w5d being seen today for ongoing prenatal care.  She is currently monitored for the following issues for this low-risk pregnancy and has Morbid obesity with BMI of 40.0-44.9, adult (HCC); Supervision of low-risk pregnancy; Alpha thalassemia trait; and Hereditary disease in family possibly affecting fetus, affecting management of mother, antepartum condition or complication, not applicable or unspecified fetus on her problem list.  Patient reports no complaints.  Contractions: Irregular. Vag. Bleeding: None.  Movement: Present. Denies leaking of fluid.   The following portions of the patient's history were reviewed and updated as appropriate: allergies, current medications, past family history, past medical history, past social history, past surgical history and problem list. Problem list updated.  Objective:   Vitals:   08/22/17 1409  BP: 127/79  Pulse: 84  Weight: 235 lb 14.4 oz (107 kg)    Fetal Status: Fetal Heart Rate (bpm): 146 Fundal Height: 37 cm Movement: Present  Presentation: Vertex  General:  Alert, oriented and cooperative. Patient is in no acute distress.  Skin: Skin is warm and dry. No rash noted.   Cardiovascular: Normal heart rate noted  Respiratory: Normal respiratory effort, no problems with respiration noted  Abdomen: Soft, gravid, appropriate for gestational age.  Pain/Pressure: Present     Pelvic: Cervical exam performed Dilation: Closed Effacement (%): 50 Station: -3  Extremities: Normal range of motion.  Edema: None  Mental Status:  Normal mood and affect. Normal behavior. Normal judgment and thought content.   Assessment and Plan:  Pregnancy: G1P0000 at 2088w5d  1. Encounter for supervision of low-risk pregnancy in third trimester - Cervicovaginal ancillary only - Culture, beta strep (group b only) - Discussed hospital tour options - Discussed pain management options for  labor  Preterm labor symptoms and general obstetric precautions including but not limited to vaginal bleeding, contractions, leaking of fluid and fetal movement were reviewed in detail with the patient. Please refer to After Visit Summary for other counseling recommendations.  Return in about 1 week (around 08/29/2017) for LOB.   Vonzella NippleJulie Meganne Rita, PA-C

## 2017-08-22 NOTE — Patient Instructions (Signed)
Braxton Hicks Contractions °Contractions of the uterus can occur throughout pregnancy, but they are not always a sign that you are in labor. You may have practice contractions called Braxton Hicks contractions. These false labor contractions are sometimes confused with true labor. °What are Braxton Hicks contractions? °Braxton Hicks contractions are tightening movements that occur in the muscles of the uterus before labor. Unlike true labor contractions, these contractions do not result in opening (dilation) and thinning of the cervix. Toward the end of pregnancy (32-34 weeks), Braxton Hicks contractions can happen more often and may become stronger. These contractions are sometimes difficult to tell apart from true labor because they can be very uncomfortable. You should not feel embarrassed if you go to the hospital with false labor. °Sometimes, the only way to tell if you are in true labor is for your health care provider to look for changes in the cervix. The health care provider will do a physical exam and may monitor your contractions. If you are not in true labor, the exam should show that your cervix is not dilating and your water has not broken. °If there are no prenatal problems or other health problems associated with your pregnancy, it is completely safe for you to be sent home with false labor. You may continue to have Braxton Hicks contractions until you go into true labor. °How can I tell the difference between true labor and false labor? °· Differences °? False labor °? Contractions last 30-70 seconds.: Contractions are usually shorter and not as strong as true labor contractions. °? Contractions become very regular.: Contractions are usually irregular. °? Discomfort is usually felt in the top of the uterus, and it spreads to the lower abdomen and low back.: Contractions are often felt in the front of the lower abdomen and in the groin. °? Contractions do not go away with walking.: Contractions may  go away when you walk around or change positions while lying down. °? Contractions usually become more intense and increase in frequency.: Contractions get weaker and are shorter-lasting as time goes on. °? The cervix dilates and gets thinner.: The cervix usually does not dilate or become thin. °Follow these instructions at home: °· Take over-the-counter and prescription medicines only as told by your health care provider. °· Keep up with your usual exercises and follow other instructions from your health care provider. °· Eat and drink lightly if you think you are going into labor. °· If Braxton Hicks contractions are making you uncomfortable: °? Change your position from lying down or resting to walking, or change from walking to resting. °? Sit and rest in a tub of warm water. °? Drink enough fluid to keep your urine clear or pale yellow. Dehydration may cause these contractions. °? Do slow and deep breathing several times an hour. °· Keep all follow-up prenatal visits as told by your health care provider. This is important. °Contact a health care provider if: °· You have a fever. °· You have continuous pain in your abdomen. °Get help right away if: °· Your contractions become stronger, more regular, and closer together. °· You have fluid leaking or gushing from your vagina. °· You pass blood-tinged mucus (bloody show). °· You have bleeding from your vagina. °· You have low back pain that you never had before. °· You feel your baby’s head pushing down and causing pelvic pressure. °· Your baby is not moving inside you as much as it used to. °Summary °· Contractions that occur before labor are   called Braxton Hicks contractions, false labor, or practice contractions. °· Braxton Hicks contractions are usually shorter, weaker, farther apart, and less regular than true labor contractions. True labor contractions usually become progressively stronger and regular and they become more frequent. °· Manage discomfort from  Braxton Hicks contractions by changing position, resting in a warm bath, drinking plenty of water, or practicing deep breathing. °This information is not intended to replace advice given to you by your health care provider. Make sure you discuss any questions you have with your health care provider. °Document Released: 11/29/2005 Document Revised: 10/18/2016 Document Reviewed: 10/18/2016 °Elsevier Interactive Patient Education © 2017 Elsevier Inc. ° ° °Fetal Movement Counts °Patient Name: ________________________________________________ Patient Due Date: ____________________ °What is a fetal movement count? °A fetal movement count is the number of times that you feel your baby move during a certain amount of time. This may also be called a fetal kick count. A fetal movement count is recommended for every pregnant woman. You may be asked to start counting fetal movements as early as week 28 of your pregnancy. °Pay attention to when your baby is most active. You may notice your baby's sleep and wake cycles. You may also notice things that make your baby move more. You should do a fetal movement count: °· When your baby is normally most active. °· At the same time each day. ° °A good time to count movements is while you are resting, after having something to eat and drink. °How do I count fetal movements? °1. Find a quiet, comfortable area. Sit, or lie down on your side. °2. Write down the date, the start time and stop time, and the number of movements that you felt between those two times. Take this information with you to your health care visits. °3. For 2 hours, count kicks, flutters, swishes, rolls, and jabs. You should feel at least 10 movements during 2 hours. °4. You may stop counting after you have felt 10 movements. °5. If you do not feel 10 movements in 2 hours, have something to eat and drink. Then, keep resting and counting for 1 hour. If you feel at least 4 movements during that hour, you may stop  counting. °Contact a health care provider if: °· You feel fewer than 4 movements in 2 hours. °· Your baby is not moving like he or she usually does. °Date: ____________ Start time: ____________ Stop time: ____________ Movements: ____________ °Date: ____________ Start time: ____________ Stop time: ____________ Movements: ____________ °Date: ____________ Start time: ____________ Stop time: ____________ Movements: ____________ °Date: ____________ Start time: ____________ Stop time: ____________ Movements: ____________ °Date: ____________ Start time: ____________ Stop time: ____________ Movements: ____________ °Date: ____________ Start time: ____________ Stop time: ____________ Movements: ____________ °Date: ____________ Start time: ____________ Stop time: ____________ Movements: ____________ °Date: ____________ Start time: ____________ Stop time: ____________ Movements: ____________ °Date: ____________ Start time: ____________ Stop time: ____________ Movements: ____________ °This information is not intended to replace advice given to you by your health care provider. Make sure you discuss any questions you have with your health care provider. °Document Released: 12/29/2006 Document Revised: 07/28/2016 Document Reviewed: 01/08/2016 °Elsevier Interactive Patient Education © 2018 Elsevier Inc. ° °

## 2017-08-23 LAB — CERVICOVAGINAL ANCILLARY ONLY
Chlamydia: NEGATIVE
Neisseria Gonorrhea: NEGATIVE

## 2017-08-26 LAB — CULTURE, BETA STREP (GROUP B ONLY): Strep Gp B Culture: NEGATIVE

## 2017-08-29 ENCOUNTER — Ambulatory Visit (INDEPENDENT_AMBULATORY_CARE_PROVIDER_SITE_OTHER): Payer: Medicaid Other | Admitting: Medical

## 2017-08-29 VITALS — BP 121/65 | HR 98 | Wt 234.4 lb

## 2017-08-29 DIAGNOSIS — Z3493 Encounter for supervision of normal pregnancy, unspecified, third trimester: Secondary | ICD-10-CM | POA: Diagnosis not present

## 2017-08-29 DIAGNOSIS — D563 Thalassemia minor: Secondary | ICD-10-CM

## 2017-08-29 DIAGNOSIS — O368130 Decreased fetal movements, third trimester, not applicable or unspecified: Secondary | ICD-10-CM

## 2017-08-29 NOTE — Progress Notes (Signed)
   PRENATAL VISIT NOTE  Subjective:  Jennifer Lynn is a 21 y.o. G1P0000 at [redacted]w[redacted]d being seen today for ongoing prenatal care.  She is currently monitored for the following issues for this low-risk pregnancy and has Morbid obesity with BMI of 40.0-44.9, adult (HCC); Supervision of low-risk pregnancy; Alpha thalassemia trait; and Hereditary disease in family possibly affecting fetus, affecting management of mother, antepartum condition or complication, not applicable or unspecified fetus on her problem list.  Patient reports decreased fetal movement.  Contractions: Irregular. Vag. Bleeding: None.  Movement: (!) Decreased. Denies leaking of fluid.   The following portions of the patient's history were reviewed and updated as appropriate: allergies, current medications, past family history, past medical history, past social history, past surgical history and problem list. Problem list updated.  Objective:   Vitals:   08/29/17 1403  BP: 121/65  Pulse: 98  Weight: 234 lb 6.4 oz (106.3 kg)    Fetal Status: Fetal Heart Rate (bpm): 140 Fundal Height: 39 cm Movement: (!) Decreased     General:  Alert, oriented and cooperative. Patient is in no acute distress.  Skin: Skin is warm and dry. No rash noted.   Cardiovascular: Normal heart rate noted  Respiratory: Normal respiratory effort, no problems with respiration noted  Abdomen: Soft, gravid, appropriate for gestational age.  Pain/Pressure: Absent     Pelvic: Cervical exam deferred        Extremities: Normal range of motion.  Edema: Trace  Mental Status:  Normal mood and affect. Normal behavior. Normal judgment and thought content.   Assessment and Plan:  Pregnancy: G1P0000 at [redacted]w[redacted]d  1. Encounter for supervision of low-risk pregnancy in third trimester - Doing well - Few contractions  2. Decreased fetal movement - Not feeling baby move well, NST today - reactive, patient marking numerous movements. Discussed kick counts again and  included on AVS - Patient advised not to wait until next routine visit if she is concerned about fetal movement. She should present to [facility] for re-evaluation before her follow-up visit   Term labor symptoms and general obstetric precautions including but not limited to vaginal bleeding, contractions, leaking of fluid and fetal movement were reviewed in detail with the patient. Please refer to After Visit Summary for other counseling recommendations.  Return in about 1 week (around 09/05/2017) for LOB.   Vonzella Nipple, PA-C

## 2017-08-29 NOTE — Progress Notes (Signed)
Pt felt good FM during NST.  Need for BID kick counts reinforced.

## 2017-08-29 NOTE — Progress Notes (Signed)
Patient verbally declined to meet with [facility] Clinician about elevated gad 7 .

## 2017-08-29 NOTE — Patient Instructions (Addendum)
Fetal Movement Counts Patient Name: ________________________________________________ Patient Due Date: ____________________ What is a fetal movement count? A fetal movement count is the number of times that you feel your baby move during a certain amount of time. This may also be called a fetal kick count. A fetal movement count is recommended for every pregnant woman. You may be asked to start counting fetal movements as early as week 28 of your pregnancy. Pay attention to when your baby is most active. You may notice your baby's sleep and wake cycles. You may also notice things that make your baby move more. You should do a fetal movement count:  When your baby is normally most active.  At the same time each day.  A good time to count movements is while you are resting, after having something to eat and drink. How do I count fetal movements? 1. Find a quiet, comfortable area. Sit, or lie down on your side. 2. Write down the date, the start time and stop time, and the number of movements that you felt between those two times. Take this information with you to your health care visits. 3. For 2 hours, count kicks, flutters, swishes, rolls, and jabs. You should feel at least 10 movements during 2 hours. 4. You may stop counting after you have felt 10 movements. 5. If you do not feel 10 movements in 2 hours, have something to eat and drink. Then, keep resting and counting for 1 hour. If you feel at least 4 movements during that hour, you may stop counting. Contact a health care provider if:  You feel fewer than 4 movements in 2 hours.  Your baby is not moving like he or she usually does. Date: ____________ Start time: ____________ Stop time: ____________ Movements: ____________ Date: ____________ Start time: ____________ Stop time: ____________ Movements: ____________ Date: ____________ Start time: ____________ Stop time: ____________ Movements: ____________ Date: ____________ Start time:  ____________ Stop time: ____________ Movements: ____________ Date: ____________ Start time: ____________ Stop time: ____________ Movements: ____________ Date: ____________ Start time: ____________ Stop time: ____________ Movements: ____________ Date: ____________ Start time: ____________ Stop time: ____________ Movements: ____________ Date: ____________ Start time: ____________ Stop time: ____________ Movements: ____________ Date: ____________ Start time: ____________ Stop time: ____________ Movements: ____________ This information is not intended to replace advice given to you by your health care provider. Make sure you discuss any questions you have with your health care provider. Document Released: 12/29/2006 Document Revised: 07/28/2016 Document Reviewed: 01/08/2016 Elsevier Interactive Patient Education  2018 [employer]. [employer] of the uterus can occur throughout pregnancy, but they are not always a sign that you are in labor. You may have practice contractions called Braxton Hicks contractions. These false labor contractions are sometimes confused with true labor. What are [place] contractions? Braxton Hicks contractions are tightening movements that occur in the muscles of the uterus before labor. Unlike true labor contractions, these contractions do not result in opening (dilation) and thinning of the cervix. Toward the end of pregnancy (32-34 weeks), Braxton Hicks contractions can happen more often and may become stronger. These contractions are sometimes difficult to tell apart from true labor because they can be very uncomfortable. You should not feel embarrassed if you go to the hospital with false labor. Sometimes, the only way to tell if you are in true labor is for your health care provider to look for changes in the cervix. The health care provider will do a physical exam and may monitor your contractions. If  you are not in true labor, the exam  should show that your cervix is not dilating and your water has not broken. If there are no prenatal problems or other health problems associated with your pregnancy, it is completely safe for you to be sent home with false labor. You may continue to have Braxton Hicks contractions until you go into true labor. How can I tell the difference between true labor and false labor?  Differences ? False labor ? Contractions last 30-70 seconds.: Contractions are usually shorter and not as strong as true labor contractions. ? Contractions become very regular.: Contractions are usually irregular. ? Discomfort is usually felt in the top of the uterus, and it spreads to the lower abdomen and low back.: Contractions are often felt in the front of the lower abdomen and in the groin. ? Contractions do not go away with walking.: Contractions may go away when you walk around or change positions while lying down. ? Contractions usually become more intense and increase in frequency.: Contractions get weaker and are shorter-lasting as time goes on. ? The cervix dilates and gets thinner.: The cervix usually does not dilate or become thin. Follow these instructions at home:  Take over-the-counter and prescription medicines only as told by your health care provider.  Keep up with your usual exercises and follow other instructions from your health care provider.  Eat and drink lightly if you think you are going into labor.  If Braxton Hicks contractions are making you uncomfortable: ? Change your position from lying down or resting to walking, or change from walking to resting. ? Sit and rest in a tub of warm water. ? Drink enough fluid to keep your urine clear or pale yellow. Dehydration may cause these contractions. ? Do slow and deep breathing several times an hour.  Keep all follow-up prenatal visits as told by your health care provider. This is important. Contact a health care provider if:  You have a  fever.  You have continuous pain in your abdomen. Get help right away if:  Your contractions become stronger, more regular, and closer together.  You have fluid leaking or gushing from your vagina.  You pass blood-tinged mucus (bloody show).  You have bleeding from your vagina.  You have low back pain that you never had before.  You feel your baby's head pushing down and causing pelvic pressure.  Your baby is not moving inside you as much as it used to. Summary  Contractions that occur before labor are called Braxton Hicks contractions, false labor, or practice contractions.  Braxton Hicks contractions are usually shorter, weaker, farther apart, and less regular than true labor contractions. True labor contractions usually become progressively stronger and regular and they become more frequent.  Manage discomfort from [facility] contractions by changing position, resting in a warm bath, drinking plenty of water, or practicing deep breathing. This information is not intended to replace advice given to you by your health care provider. Make sure you discuss any questions you have with your health care provider. Document Released: 11/29/2005 Document Revised: 10/18/2016 Document Reviewed: 10/18/2016 Elsevier Interactive Patient Education  2017 Elsevier [employer].   AREA PEDIATRIC/FAMILY PRACTICE PHYSICIANS  [facility] [address] [place], [place]  [address] Phone - [phone]   Fax - [phone]  [facility] [address] [place], [place] [address] Phone - [phone]   Fax - [phone]  [facility] [address]  [place]  [address] Phone - [phone]   Fax - [phone]  [facility] [address] [place], [place]  [address] Phone - [phone]   Fax - [phone]  [facility] [address] [place], [place]  [address] Phone - [phone]   Fax -  [phone]  [facility] [address] [place], [place]  [address] Phone - [phone]   Fax - [phone]  [facility] [address] [place], [place]  [address] Phone - [phone]   Fax - [phone]  [facility] [address] [place], [place]  [address] Phone - [phone]   Fax - [phone]  [facility] [address] [place], [place]  [address] Phone - [phone]   Fax - [phone] [facility] [address] [place], [place]  [address] Phone - [phone]   Fax - [phone]  [facility] [address] [place], [place]  [address] Phone - [phone]   Fax - [phone]  [facility] [address] [place], [place]  [address] Phone - [phone]   Fax - [phone]  [facility] [address] [place], [place]  [address] Phone - [phone]   Fax - [phone]  [facility] GOSR[facility] [address] [place], [place]  [address] Phone - [phone]  [facility] [address] [place], [place]  [address] Phone - [phone]   Fax - [phone]  [facility] [address] [place], [place]  [address] Phone - [phone]   Fax - [phone]  [facility] [address] [place], [place]  [address] Phone - [phone]   Fax - [phone]  [facility] [address] [place], [place]  [address] Phone - [phone]   Fax - [phone]   [facility] [address] [place], [place]  [address] Phone - [phone]   Fax - [phone]  [facility] [address] [place], [place]  [address] Phone - [phone]   Fax - [phone]  [facility] RUB[facility] [address] [place], [place]  [address] Phone - [phone]   Fax - [phone]  [facility] [address] [place], [place]  [address] Phone - [phone]   Fax - [phone]  [facility] [address] , [place]  [address] Phone - [phone]   Fax - [phone] [facility] [address] [place], [place]  [address] Phone - [phone]   Fax - [phone]  [facility] [address] [place], [place]  [address] Phone - [phone]   Fax - [phone]  [facility] [address] [place],   [address] Phone - [phone]   Fax - [phone]  [facility] Charlene Flemming MD [address] [place]  [address] Phone [phone]  Fax [phone]   

## 2017-09-05 ENCOUNTER — Ambulatory Visit (INDEPENDENT_AMBULATORY_CARE_PROVIDER_SITE_OTHER): Payer: Medicaid Other | Admitting: Certified Nurse Midwife

## 2017-09-05 VITALS — BP 123/81 | HR 101 | Wt 238.0 lb

## 2017-09-05 DIAGNOSIS — Z3493 Encounter for supervision of normal pregnancy, unspecified, third trimester: Secondary | ICD-10-CM

## 2017-09-05 NOTE — Patient Instructions (Signed)

## 2017-09-05 NOTE — Progress Notes (Signed)
Subjective:  Jennifer Lynn is a 21 y.o. G1P0000 at [redacted]w[redacted]d being seen today for ongoing prenatal care.  She is currently monitored for the following issues for this low-risk pregnancy and has Morbid obesity with BMI of 40.0-44.9, adult (HCC); Supervision of low-risk pregnancy; Alpha thalassemia trait; and Hereditary disease in family possibly affecting fetus, affecting management of mother, antepartum condition or complication, not applicable or unspecified fetus on her problem list.  Patient reports no complaints.  Contractions: Not present.  .  Movement: Present. Denies leaking of fluid.   The following portions of the patient's history were reviewed and updated as appropriate: allergies, current medications, past family history, past medical history, past social history, past surgical history and problem list. Problem list updated.  Objective:   Vitals:   09/05/17 1407  BP: 123/81  Pulse: (!) 101  Weight: 238 lb (108 kg)    Fetal Status: Fetal Heart Rate (bpm): 145 Fundal Height: 38 cm Movement: Present  Presentation: Vertex  General:  Alert, oriented and cooperative. Patient is in no acute distress.  Skin: Skin is warm and dry. No rash noted.   Cardiovascular: Normal heart rate noted  Respiratory: Normal respiratory effort, no problems with respiration noted  Abdomen: Soft, gravid, appropriate for gestational age. Pain/Pressure: Absent     Pelvic:       Cervical exam deferred        Extremities: Normal range of motion.  Edema: Trace  Mental Status: Normal mood and affect. Normal behavior. Normal judgment and thought content.   Urinalysis:      Assessment and Plan:  Pregnancy: G1P0000 at [redacted]w[redacted]d  1. Supervision of low-risk pregnancy  Term labor symptoms and general obstetric precautions including but not limited to vaginal bleeding, contractions, leaking of fluid and fetal movement were reviewed in detail with the patient. Please refer to After Visit Summary for other  counseling recommendations.  Return in about 1 week (around 09/12/2017).   Donette Larry, CNM

## 2017-09-05 NOTE — Progress Notes (Signed)
Declined flu at this time 

## 2017-09-12 ENCOUNTER — Ambulatory Visit (INDEPENDENT_AMBULATORY_CARE_PROVIDER_SITE_OTHER): Payer: Medicaid Other | Admitting: Obstetrics & Gynecology

## 2017-09-12 ENCOUNTER — Other Ambulatory Visit (HOSPITAL_COMMUNITY)
Admission: RE | Admit: 2017-09-12 | Discharge: 2017-09-12 | Disposition: A | Discharge status: Home / Self Care | Payer: Medicaid Other | Source: Ambulatory Visit | Attending: Obstetrics & Gynecology | Admitting: Obstetrics & Gynecology

## 2017-09-12 VITALS — BP 114/88 | HR 80 | Wt 241.0 lb

## 2017-09-12 DIAGNOSIS — N898 Other specified noninflammatory disorders of vagina: Secondary | ICD-10-CM

## 2017-09-12 DIAGNOSIS — Z6841 Body Mass Index (BMI) 40.0 and over, adult: Secondary | ICD-10-CM

## 2017-09-12 DIAGNOSIS — Z3493 Encounter for supervision of normal pregnancy, unspecified, third trimester: Secondary | ICD-10-CM | POA: Insufficient documentation

## 2017-09-12 NOTE — Addendum Note (Signed)
Addended by: Kathee Delton on: 09/12/2017 01:42 PM   Modules accepted: Orders

## 2017-09-12 NOTE — Progress Notes (Signed)
   PRENATAL VISIT NOTE  Subjective:  Jennifer Lynn is a 21 y.o. G1P0000 at [redacted]w[redacted]d being seen today for ongoing prenatal care.  She is currently monitored for the following issues for this low-risk pregnancy and has Morbid obesity with BMI of 40.0-44.9, adult (HCC); Supervision of low-risk pregnancy; Alpha thalassemia trait; and Hereditary disease in family possibly affecting fetus, affecting management of mother, antepartum condition or complication, not applicable or unspecified fetus on her problem list.  Patient reports no complaints.   .  .   . Denies leaking of fluid.   The following portions of the patient's history were reviewed and updated as appropriate: allergies, current medications, past family history, past medical history, past social history, past surgical history and problem list. Problem list updated.  Objective:  There were no vitals filed for this visit.  Fetal Status:           General:  Alert, oriented and cooperative. Patient is in no acute distress.  Skin: Skin is warm and dry. No rash noted.   Cardiovascular: Normal heart rate noted  Respiratory: Normal respiratory effort, no problems with respiration noted  Abdomen: Soft, gravid, appropriate for gestational age.        Pelvic: Cervical exam deferred        Extremities: Normal range of motion.     Mental Status:  Normal mood and affect. Normal behavior. Normal judgment and thought content.   Assessment and Plan:  Pregnancy: G1P0000 at [redacted]w[redacted]d  1. Encounter for supervision of low-risk pregnancy in third trimester   2. Morbid obesity with BMI of 40.0-44.9, adult (HCC) 3. Vaginal discharge- wet prep sent  Term labor symptoms and general obstetric precautions including but not limited to vaginal bleeding, contractions, leaking of fluid and fetal movement were reviewed in detail with the patient. Please refer to After Visit Summary for other counseling recommendations.  No Follow-up on file.   Allie Bossier,  MD

## 2017-09-13 ENCOUNTER — Telehealth: Payer: Self-pay | Admitting: *Deleted

## 2017-09-13 LAB — CERVICOVAGINAL ANCILLARY ONLY
BACTERIAL VAGINITIS: NEGATIVE
CANDIDA VAGINITIS: NEGATIVE
Trichomonas: NEGATIVE

## 2017-09-13 NOTE — Telephone Encounter (Signed)
Patient left message on nurse voicemail today at 1248.  Patient states she is calling for test results.  Returned patients phone call.  Explained test results aren't back yet.  Explained it sometimes takes a couple of days.  Patient states she is experiencing some abdominal cramping and low back pain.  States she wants to know if this is something she needs to worry about.  Explained to patient her body is probably preparing to go into labor in the next few days.  Encouraged her to drink lots of water and to walk.  Patient states understanding.

## 2017-09-14 ENCOUNTER — Inpatient Hospital Stay (EMERGENCY_DEPARTMENT_HOSPITAL)
Admission: AD | Admit: 2017-09-14 | Discharge: 2017-09-14 | Disposition: A | Discharge status: Home / Self Care | Payer: Medicaid Other | Source: Ambulatory Visit | Attending: Obstetrics & Gynecology | Admitting: Obstetrics & Gynecology

## 2017-09-14 ENCOUNTER — Inpatient Hospital Stay (HOSPITAL_COMMUNITY)
Admission: AD | Admit: 2017-09-14 | Discharge: 2017-09-18 | DRG: 786 | Disposition: A | Discharge status: Home / Self Care | Payer: Medicaid Other | Source: Ambulatory Visit | Attending: Obstetrics and Gynecology | Admitting: Obstetrics and Gynecology

## 2017-09-14 ENCOUNTER — Inpatient Hospital Stay (HOSPITAL_COMMUNITY): Payer: Medicaid Other | Admitting: Anesthesiology

## 2017-09-14 ENCOUNTER — Encounter (HOSPITAL_COMMUNITY): Payer: Self-pay | Admitting: *Deleted

## 2017-09-14 DIAGNOSIS — O471 False labor at or after 37 completed weeks of gestation: Secondary | ICD-10-CM | POA: Diagnosis not present

## 2017-09-14 DIAGNOSIS — Z3493 Encounter for supervision of normal pregnancy, unspecified, third trimester: Secondary | ICD-10-CM

## 2017-09-14 DIAGNOSIS — O324XX0 Maternal care for high head at term, not applicable or unspecified: Secondary | ICD-10-CM | POA: Diagnosis present

## 2017-09-14 DIAGNOSIS — D62 Acute posthemorrhagic anemia: Secondary | ICD-10-CM | POA: Diagnosis not present

## 2017-09-14 DIAGNOSIS — O26893 Other specified pregnancy related conditions, third trimester: Secondary | ICD-10-CM | POA: Diagnosis present

## 2017-09-14 DIAGNOSIS — O411230 Chorioamnionitis, third trimester, not applicable or unspecified: Secondary | ICD-10-CM | POA: Diagnosis present

## 2017-09-14 DIAGNOSIS — Z3A40 40 weeks gestation of pregnancy: Secondary | ICD-10-CM

## 2017-09-14 DIAGNOSIS — O9081 Anemia of the puerperium: Secondary | ICD-10-CM | POA: Diagnosis not present

## 2017-09-14 DIAGNOSIS — D563 Thalassemia minor: Secondary | ICD-10-CM | POA: Diagnosis present

## 2017-09-14 DIAGNOSIS — O1400 Mild to moderate pre-eclampsia, unspecified trimester: Secondary | ICD-10-CM | POA: Diagnosis present

## 2017-09-14 DIAGNOSIS — Z6841 Body Mass Index (BMI) 40.0 and over, adult: Secondary | ICD-10-CM

## 2017-09-14 DIAGNOSIS — O99214 Obesity complicating childbirth: Secondary | ICD-10-CM | POA: Diagnosis present

## 2017-09-14 DIAGNOSIS — O1404 Mild to moderate pre-eclampsia, complicating childbirth: Principal | ICD-10-CM | POA: Diagnosis present

## 2017-09-14 DIAGNOSIS — O411290 Chorioamnionitis, unspecified trimester, not applicable or unspecified: Secondary | ICD-10-CM | POA: Diagnosis not present

## 2017-09-14 LAB — CBC
HEMATOCRIT: 36 % (ref 36.0–46.0)
Hemoglobin: 11.8 g/dL — ABNORMAL LOW (ref 12.0–15.0)
MCH: 26.3 pg (ref 26.0–34.0)
MCHC: 32.8 g/dL (ref 30.0–36.0)
MCV: 80.4 fL (ref 78.0–100.0)
Platelets: 227 10*3/uL (ref 150–400)
RBC: 4.48 MIL/uL (ref 3.87–5.11)
RDW: 16 % — AB (ref 11.5–15.5)
WBC: 10.4 10*3/uL (ref 4.0–10.5)

## 2017-09-14 LAB — TYPE AND SCREEN
ABO/RH(D): O POS
ANTIBODY SCREEN: NEGATIVE

## 2017-09-14 MED ORDER — PHENYLEPHRINE 40 MCG/ML (10ML) SYRINGE FOR IV PUSH (FOR BLOOD PRESSURE SUPPORT)
80.0000 ug | PREFILLED_SYRINGE | INTRAVENOUS | Status: DC | PRN
  Filled 2017-09-14: qty 5

## 2017-09-14 MED ORDER — OXYCODONE HCL 5 MG PO TABS
5.0000 mg | ORAL_TABLET | Freq: Once | ORAL | Status: AC
  Administered 2017-09-14: 5 mg via ORAL
  Filled 2017-09-14: qty 1

## 2017-09-14 MED ORDER — LIDOCAINE HCL (PF) 1 % IJ SOLN
30.0000 mL | INTRAMUSCULAR | Status: DC | PRN
Start: 2017-09-14 — End: 2017-09-16
  Filled 2017-09-14 (×2): qty 30

## 2017-09-14 MED ORDER — LACTATED RINGERS IV SOLN
INTRAVENOUS | Status: DC
  Administered 2017-09-14 – 2017-09-16 (×2): via INTRAVENOUS

## 2017-09-14 MED ORDER — ONDANSETRON HCL 4 MG/2ML IJ SOLN: 4.0000 mg | Freq: Four times a day (QID) | INTRAMUSCULAR | Status: DC | PRN

## 2017-09-14 MED ORDER — PHENYLEPHRINE 40 MCG/ML (10ML) SYRINGE FOR IV PUSH (FOR BLOOD PRESSURE SUPPORT)
80.0000 ug | PREFILLED_SYRINGE | INTRAVENOUS | Status: DC | PRN
  Filled 2017-09-14: qty 10
  Filled 2017-09-14: qty 5

## 2017-09-14 MED ORDER — LACTATED RINGERS IV SOLN: 500.0000 mL | INTRAVENOUS | Status: DC | PRN

## 2017-09-14 MED ORDER — FENTANYL 2.5 MCG/ML BUPIVACAINE 1/10 % EPIDURAL INFUSION (WH - ANES)
14.0000 mL/h | INTRAMUSCULAR | Status: DC | PRN
  Administered 2017-09-14 – 2017-09-15 (×4): 14 mL/h via EPIDURAL
  Filled 2017-09-14 (×4): qty 100

## 2017-09-14 MED ORDER — SOD CITRATE-CITRIC ACID 500-334 MG/5ML PO SOLN
30.0000 mL | ORAL | Status: DC | PRN
  Administered 2017-09-14: 30 mL via ORAL
  Filled 2017-09-14 (×2): qty 15

## 2017-09-14 MED ORDER — LIDOCAINE HCL (PF) 1 % IJ SOLN
INTRAMUSCULAR | Status: DC | PRN
  Administered 2017-09-14 (×2): 5 mL

## 2017-09-14 MED ORDER — OXYTOCIN BOLUS FROM INFUSION: 500.0000 mL | Freq: Once | INTRAVENOUS | Status: DC

## 2017-09-14 MED ORDER — FLEET ENEMA 7-19 GM/118ML RE ENEM: 1.0000 | ENEMA | Freq: Every day | RECTAL | Status: DC | PRN

## 2017-09-14 MED ORDER — OXYTOCIN 40 UNITS IN LACTATED RINGERS INFUSION - SIMPLE MED
2.5000 [IU]/h | INTRAVENOUS | Status: DC
  Filled 2017-09-14: qty 1000

## 2017-09-14 MED ORDER — FENTANYL CITRATE (PF) 100 MCG/2ML IJ SOLN: 50.0000 ug | INTRAMUSCULAR | Status: DC | PRN

## 2017-09-14 MED ORDER — ACETAMINOPHEN 325 MG PO TABS
650.0000 mg | ORAL_TABLET | ORAL | Status: DC | PRN
  Administered 2017-09-15 (×3): 650 mg via ORAL
  Filled 2017-09-14 (×3): qty 2

## 2017-09-14 MED ORDER — EPHEDRINE 5 MG/ML INJ
10.0000 mg | INTRAVENOUS | Status: DC | PRN
  Filled 2017-09-14: qty 2

## 2017-09-14 MED ORDER — OXYCODONE-ACETAMINOPHEN 5-325 MG PO TABS: 1.0000 | ORAL_TABLET | ORAL | Status: DC | PRN

## 2017-09-14 MED ORDER — LACTATED RINGERS IV SOLN
500.0000 mL | Freq: Once | INTRAVENOUS | Status: AC
  Administered 2017-09-14: 21:00:00 via INTRAVENOUS

## 2017-09-14 MED ORDER — OXYCODONE-ACETAMINOPHEN 5-325 MG PO TABS: 2.0000 | ORAL_TABLET | ORAL | Status: DC | PRN

## 2017-09-14 MED ORDER — ACETAMINOPHEN 500 MG PO TABS
1000.0000 mg | ORAL_TABLET | Freq: Once | ORAL | Status: AC
  Administered 2017-09-14: 1000 mg via ORAL
  Filled 2017-09-14: qty 2

## 2017-09-14 MED ORDER — DIPHENHYDRAMINE HCL 50 MG/ML IJ SOLN: 12.5000 mg | INTRAMUSCULAR | Status: DC | PRN

## 2017-09-14 NOTE — Progress Notes (Signed)
OB Interim Progress Note  S: Feeling good and without pain.   O: BP 128/76   Pulse 76   Temp 98.1 F (36.7 C) (Oral)   Resp 20   Ht  (1.575 m)   Wt 109.3 kg (241 lb)   LMP 12/08/2016 (Exact Date)   BMI 44.08 kg/m    Dilation: 5 Effacement (%): 90 Cervical Position: Middle Station: -2 Presentation: Vertex Exam by:: A. White, RN  Uterine Contractions q7 min FHT: 145, good variability, pos accels, prolonged decels  A/P: AROM  light meconium. Contractions 5-7 min. Will start pitocin 1x1.   Continue expectant management Anticipate SVD  Renne Musca, MD 09/14/2017, 11:55 PM PGY-2

## 2017-09-14 NOTE — Discharge Instructions (Signed)

## 2017-09-14 NOTE — MAU Note (Signed)
Contractions started around 0500.  Denies LOF/VB. Denies problems with pregnancy

## 2017-09-14 NOTE — H&P (Signed)
LABOR AND DELIVERY ADMISSION HISTORY AND PHYSICAL NOTE  Jennifer Lynn is a 21 y.o. female G1P0000 with IUP at [redacted]w[redacted]d by LMP presenting for onset of labor.  She came in today due to increasing contractions lasting 1 minute in length at 3-5 minute intervals. She reports positive fetal movement. She denies leakage of fluid or vaginal bleeding. Denies headache, CP, SOB, Abdominal pain, n/v, urinary burning or pain, no constipation.  Prenatal History/Complications: PNC at [facility] Pregnancy complications: Obesity  Past Medical History: Past Medical History:  Diagnosis Date  . Medical history non-contributory     Past Surgical History: Past Surgical History:  Procedure Laterality Date  . NO PAST SURGERIES      Obstetrical History: OB History    Gravida Para Term Preterm AB Living   1 0 0 0 0 0   SAB TAB Ectopic Multiple Live Births   0 0 0 0 0      Social History: Social History   Social History  . Marital status: Single    Spouse name: N/A  . Number of children: N/A  . Years of education: N/A   Social History Main Topics  . Smoking status: Never Smoker  . Smokeless tobacco: Never Used  . Alcohol use No  . Drug use: No  . Sexual activity: Yes    Birth control/ protection: Condom   Other Topics Concern  . None   Social History Narrative  . None    Family History: Family History  Problem Relation Age of Onset  . Diabetes Maternal Grandmother   . Diabetes Paternal Grandmother   Also HTN in both grandparents  Allergies: No Known Allergies  Prescriptions Prior to Admission  Medication Sig Dispense Refill Last Dose  . Biotin 1000 MCG tablet Take 1,000 mcg by mouth 3 (three) times daily.   09/13/2017 at Unknown time  . ferrous sulfate 325 (65 FE) MG tablet Take 1 tablet (325 mg total) by mouth daily with breakfast. 30 tablet 3 09/13/2017 at Unknown time  . Prenatal Vit-Fe Fumarate-FA (PRENATAL MULTIVITAMIN) TABS tablet Take 1 tablet by mouth daily at 12 noon.    09/13/2017 at Unknown time     Review of Systems  All systems reviewed and negative except as stated in HPI  Physical Exam Blood pressure (!) 143/86, pulse 76, temperature 98.6 F (37 C), temperature source Oral, resp. rate 17, last menstrual period 12/08/2016. General appearance: alert, cooperative and no distress Lungs: clear to auscultation bilaterally Heart: regular rate and rhythm Abdomen: soft, non-tender; bowel sounds normal Extremities: No calf swelling or tenderness Presentation: vertex Fetal monitoring: 135bpm; moderate variability, accelerations present, decelerations absent Uterine activity: contractions every 3-5 minutes Dilation: 4.5 Effacement (%): 100, 90 Station: -2 Exam by:: Exie Parody, RN  Prenatal labs: ABO, Rh: --/--/O POS (03/11 1610) Antibody: Negative (03/08 0858) Rubella: 5.20 (03/08 0858) RPR: Non Reactive (07/17 1333)  HBsAg: Negative (03/08 0858)  HIV:   negative GC/Chlamydia: negative GBS:   negative 2 hr Glucola: 72, 132-111  Genetic screening:  1 Screen: nl NT   AFP:  NEG Anatomy US: Normal  Prenatal Transfer Tool  Maternal Diabetes: No Genetic Screening: Normal Maternal Ultrasounds/Referrals: Normal Fetal Ultrasounds or other Referrals:  None Maternal Substance Abuse:  No Significant Maternal Medications:  None Significant Maternal Lab Results: Lab values include: Other: Alpha Thalassemia trait  No results found for this or any previous visit (from the past 24 hour(s)).  Patient Active Problem List   Diagnosis Date Noted  . Hereditary  disease in family possibly affecting fetus, affecting management of mother, antepartum condition or complication, not applicable or unspecified fetus   . Alpha thalassemia trait 03/17/2017  . Supervision of low-risk pregnancy 02/17/2017  . Morbid obesity with BMI of 40.0-44.9, adult (HCC) 06/26/2015    Assessment: Jennifer Lynn is a 21 y.o. G1P0000 at [redacted]w[redacted]d here for onset of labor.  #Labor:  Admit and monitor for cervical change and dilation #Pain: Planning on getting epidural #FWB: Cat 1 #ID:  Not indicated #MOF: Breast #MOC: IUD  Arlyce Harman 09/14/2017, 7:19 PM  OB FELLOW HISTORY AND PHYSICAL ATTESTATION  I have seen and examined this patient; I agree with above documentation in the resident's note.  Admit to L&D Expectant management   Frederik Pear, MD OB Fellow 09/14/2017, 10:32 PM

## 2017-09-14 NOTE — Anesthesia Preprocedure Evaluation (Signed)

## 2017-09-14 NOTE — MAU Provider Note (Signed)
Chief Complaint:  Contractions   None    HPI  HPI: Jennifer Lynn is a 21 y.o. G1P0000 at 26w0dwho presents to maternity admissions reporting  Contractions since 5am.  . She reports good fetal movement, denies LOF, vaginal bleeding, vaginal itching/burning, urinary symptoms, h/a, dizziness, n/v, diarrhea, constipation or fever/chills.  She denies headache, visual changes or RUQ abdominal pain.  Had been evaluated by RN as a labor eval but had a questionable deceleration on Fetal Monitor.  After looking at tracing, it was determined (and Dr Lynetta Mare agreed) that this was a loss of fetal signal, as line coincided with maternal pulse oximetry  RN Note: Contractions started at 0500. No bleeding or leaking. Has not been dilated.  Denies problems with preg   Past Medical History: Past Medical History:  Diagnosis Date  . Medical history non-contributory     Past obstetric history: OB History  Gravida Para Term Preterm AB Living  1 0 0 0 0 0  SAB TAB Ectopic Multiple Live Births  0 0 0 0 0    # Outcome Date GA Lbr Len/2nd Weight Sex Delivery Anes PTL Lv  1 Current               Past Surgical History: Past Surgical History:  Procedure Laterality Date  . NO PAST SURGERIES      Family History: Family History  Problem Relation Age of Onset  . Diabetes Maternal Grandmother   . Diabetes Paternal Grandmother     Social History: Social History  Substance Use Topics  . Smoking status: Never Smoker  . Smokeless tobacco: Never Used  . Alcohol use No    Allergies: No Known Allergies  Meds:  Prescriptions Prior to Admission  Medication Sig Dispense Refill Last Dose  . Biotin 1000 MCG tablet Take 1,000 mcg by mouth 3 (three) times daily.   09/13/2017 at Unknown time  . ferrous sulfate 325 (65 FE) MG tablet Take 1 tablet (325 mg total) by mouth daily with breakfast. 30 tablet 3 09/13/2017 at Unknown time  . Prenatal Vit-Fe Fumarate-FA (PRENATAL MULTIVITAMIN) TABS tablet Take 1  tablet by mouth daily at 12 noon.   09/13/2017 at Unknown time    I have reviewed patient's Past Medical Hx, Surgical Hx, Family Hx, Social Hx, medications and allergies.   ROS:  Review of Systems Other systems negative  Physical Exam  Patient Vitals for the past 24 hrs:  BP Temp Temp src Pulse Resp SpO2 Height Weight  09/14/17 0803 133/86 98.1 F (36.7 C) Oral 79 18 99 % 5' 2.5" (1.588 m) 241 lb 12 oz (109.7 kg)   Constitutional: Well-developed, well-nourished female in no acute distress.  Cardiovascular: normal rate and rhythm Respiratory: normal effort, clear to auscultation bilaterally GI: Abd soft, non-tender, gravid appropriate for gestational age.   No rebound or guarding. MS: Extremities nontender, no edema, normal ROM Neurologic: Alert and oriented x 4.  GU: Neg CVAT.  PELVIC EXAM:  Dilation: 2.5 Effacement (%): 50 Cervical Position: Posterior Station: -2 Presentation: Vertex Exam by:: Duncan Dull   FHT:  Baseline 140 , moderate variability, accelerations present, no decelerations Contractions: q 5 mins Irregular     Labs: No results found for this or any previous visit (from the past 24 hour(s)). --/--/O POS (03/11 0457)  Imaging:  No results found.  MAU Course/MDM: I have ordered labs and reviewed results.  NST reviewed  We monitored her for a while after questionable decel and then discharged her home Consult  Dr Macon Large with presentation, exam findings and test results.  Treatments in MAU included EFM and labor eval..    Assessment: 1. False labor after 37 completed weeks of gestation   2. Encounter for supervision of low-risk pregnancy in third trimester     Plan: Discharge home Labor precautions and fetal kick counts Follow up in Office for prenatal visits and recheck of status  Encouraged to return here or to other Urgent Care/ED if she develops worsening of symptoms, increase in pain, fever, or other concerning symptoms.   Pt stable at time of  discharge.  Wynelle Bourgeois CNM, MSN Certified Nurse-Midwife 09/14/2017 12:10 PM

## 2017-09-14 NOTE — MAU Note (Signed)
Contractions started at 0500. No bleeding or leaking. Has not been dilated.  Denies problems with preg

## 2017-09-14 NOTE — Anesthesia Procedure Notes (Signed)
Epidural Patient location during procedure: OB  Staffing Anesthesiologist: Lejend Dalby Performed: anesthesiologist   Preanesthetic Checklist Completed: patient identified, site marked, surgical consent, pre-op evaluation, timeout performed, IV checked, risks and benefits discussed and monitors and equipment checked  Epidural Patient position: sitting Prep: DuraPrep Patient monitoring: heart rate, continuous pulse ox and blood pressure Approach: right paramedian Location: L3-L4 Injection technique: LOR saline  Needle:  Needle type: Tuohy  Needle gauge: 17 G Needle length: 9 cm and 9 Needle insertion depth: 6 cm Catheter type: closed end flexible Catheter size: 20 Guage Catheter at skin depth: 10 cm Test dose: negative  Assessment Events: blood not aspirated, injection not painful, no injection resistance, negative IV test and no paresthesia  Additional Notes Patient identified. Risks/Benefits/Options discussed with patient including but not limited to bleeding, infection, nerve damage, paralysis, failed block, incomplete pain control, headache, blood pressure changes, nausea, vomiting, reactions to medication both or allergic, itching and postpartum back pain. Confirmed with bedside nurse the patient's most recent platelet count. Confirmed with patient that they are not currently taking any anticoagulation, have any bleeding history or any family history of bleeding disorders. Patient expressed understanding and wished to proceed. All questions were answered. Sterile technique was used throughout the entire procedure. Please see nursing notes for vital signs. Test dose was given through epidural needle and negative prior to continuing to dose epidural or start infusion. Warning signs of high block given to the patient including shortness of breath, tingling/numbness in hands, complete motor block, or any concerning symptoms with instructions to call for help. Patient was given  instructions on fall risk and not to get out of bed. All questions and concerns addressed with instructions to call with any issues.     

## 2017-09-15 ENCOUNTER — Encounter (HOSPITAL_COMMUNITY): Payer: Self-pay | Admitting: *Deleted

## 2017-09-15 LAB — COMPREHENSIVE METABOLIC PANEL
ALBUMIN: 2.8 g/dL — AB (ref 3.5–5.0)
ALK PHOS: 102 U/L (ref 38–126)
ALT: 12 U/L — AB (ref 14–54)
AST: 22 U/L (ref 15–41)
Anion gap: 10 (ref 5–15)
BUN: 8 mg/dL (ref 6–20)
CALCIUM: 8.5 mg/dL — AB (ref 8.9–10.3)
CO2: 22 mmol/L (ref 22–32)
CREATININE: 1.04 mg/dL — AB (ref 0.44–1.00)
Chloride: 106 mmol/L (ref 101–111)
GFR calc Af Amer: >60 mL/min (ref >60)
GFR calc non Af Amer: >60 mL/min (ref >60)
GLUCOSE: 93 mg/dL (ref 65–99)
Potassium: 3.8 mmol/L (ref 3.5–5.1)
SODIUM: 138 mmol/L (ref 135–145)
Total Bilirubin: 1.3 mg/dL — ABNORMAL HIGH (ref 0.3–1.2)
Total Protein: 5.9 g/dL — ABNORMAL LOW (ref 6.5–8.1)

## 2017-09-15 LAB — OB RESULTS CONSOLE GBS: GBS: NEGATIVE

## 2017-09-15 LAB — RPR: RPR: NONREACTIVE

## 2017-09-15 LAB — PROTEIN / CREATININE RATIO, URINE
Creatinine, Urine: 38 mg/dL
Protein Creatinine Ratio: 1.71 mg/mg{Cre} — ABNORMAL HIGH (ref 0.00–0.15)
Total Protein, Urine: 65 mg/dL

## 2017-09-15 LAB — ABO/RH: ABO/RH(D): O POS

## 2017-09-15 MED ORDER — GENTAMICIN SULFATE 40 MG/ML IJ SOLN
1.5000 mg/kg | Freq: Three times a day (TID) | INTRAMUSCULAR | Status: DC
  Administered 2017-09-15 (×2): 160 mg via INTRAVENOUS
  Filled 2017-09-15 (×4): qty 4

## 2017-09-15 MED ORDER — OXYTOCIN 40 UNITS IN LACTATED RINGERS INFUSION - SIMPLE MED
1.0000 m[IU]/min | INTRAVENOUS | Status: DC
  Administered 2017-09-15: 1 m[IU]/min via INTRAVENOUS
  Administered 2017-09-15: 12 m[IU]/min via INTRAVENOUS

## 2017-09-15 MED ORDER — AMPICILLIN SODIUM 2 G IJ SOLR
2.0000 g | Freq: Four times a day (QID) | INTRAMUSCULAR | Status: DC
  Administered 2017-09-15 – 2017-09-16 (×3): 2 g via INTRAVENOUS
  Filled 2017-09-15 (×5): qty 2000

## 2017-09-15 NOTE — Progress Notes (Signed)
IUPC placed without difficulty. Will continue to monitor for progress. Pit to 5 currently and patient remains comfortable.   Milady Fleener L. Myrtie Soman, MD PGY-2 09/15/2017 5:32 AM

## 2017-09-15 NOTE — Anesthesia Pain Management Evaluation Note (Signed)
  CRNA Pain Management Visit Note  Patient: Jennifer Lynn, 21 y.o., female  "Hello I am a member of the anesthesia team at [facility]. We have an anesthesia team available at all times to provide care throughout the hospital, including epidural management and anesthesia for C-section. I don't know your plan for the delivery whether it a natural birth, water birth, IV sedation, nitrous supplementation, doula or epidural, but we want to meet your pain goals."   1.Was your pain managed to your expectations on prior hospitalizations?   No prior hospitalizations  2.What is your expectation for pain management during this hospitalization?     Epidural  3.How can we help you reach that goal? Epidural intact and working well;right side less dense than left but pt reports only pressure sensation with contraction on that side  Record the patient's initial score and the patient's pain goal.   Pain: 3  Pain Goal: 4 [facility] wants you to be able to say your pain was always managed very well.  Edison Pace 09/15/2017

## 2017-09-15 NOTE — Progress Notes (Signed)
OB Interim Progress Note  S: Feeling well without complaints.   O: BP 114/63   Pulse 69   Temp 98.4 F (36.9 C) (Oral)   Resp 18   Ht  (1.575 m)   Wt 109.3 kg (241 lb)   LMP 12/08/2016 (Exact Date)   BMI 44.08 kg/m    Dilation: 5 Effacement (%): 90 Cervical Position: Middle Station: -2 Presentation: Vertex Exam by:: dr Chauncy Mangiaracina  FHT: 135, good variability, pos accels, no decels.  Uterine activity: q 1.5-4  A/P: No progress over past several hours. Pitocin increased to 5. Will continue to monitor for progress. Patient is comfortable.  Continue expectant management Anticipate SVD  Renne Musca, MD 09/15/2017, 4:05 AM PGY-2

## 2017-09-15 NOTE — Progress Notes (Signed)
Jennifer Lynn is a 21 y.o. G1P0000 at [redacted]w[redacted]d admitted for active labor  Subjective: Ms Saez is feeling cold and shivering, according to her nurse she is also feeling warm in her groin area.  Objective: BP 106/85   Pulse 97   Temp 99.2 F (37.3 C) (Oral)   Resp 18   Ht  (1.575 m)   Wt 241 lb (109.3 kg)   LMP 12/08/2016 (Exact Date)   BMI 44.08 kg/m  No intake/output data recorded. No intake/output data recorded.  FHT:  FHR: 135 bpm, variability: minimal ,  accelerations:  Present,  decelerations:  Absent UC:   regular, every 2 minutes SVE:   Dilation: 7 Effacement (%): 90 Station: 0 Exam by:: M.Lee, B. Torrence, RN  Labs: Lab Results  Component Value Date   WBC 10.4 09/14/2017   HGB 11.8 (L) 09/14/2017   HCT 36.0 09/14/2017   MCV 80.4 09/14/2017   PLT 227 09/14/2017    Assessment / Plan: Augmentation of labor, progressing well. She did have a last reported temp of 100.0 and we decided to start antibiotics prophylaxis.  Labor: Progressing normally on Pit, AROM 12 hours ago Fetal Wellbeing:  Category I Pain Control:  Epidural I/D:  Amp 2g q6, Gen 1.5mg /kg q8 Anticipated MOD:  NSVD  Payal Stanforth 09/15/2017, 12:03 PM

## 2017-09-16 ENCOUNTER — Encounter (HOSPITAL_COMMUNITY): Payer: Self-pay | Admitting: *Deleted

## 2017-09-16 ENCOUNTER — Encounter (HOSPITAL_COMMUNITY)
Admission: AD | Disposition: A | Discharge status: Home / Self Care | Payer: Self-pay | Source: Ambulatory Visit | Attending: Obstetrics and Gynecology

## 2017-09-16 DIAGNOSIS — O1404 Mild to moderate pre-eclampsia, complicating childbirth: Secondary | ICD-10-CM

## 2017-09-16 DIAGNOSIS — O1400 Mild to moderate pre-eclampsia, unspecified trimester: Secondary | ICD-10-CM | POA: Diagnosis present

## 2017-09-16 DIAGNOSIS — O411230 Chorioamnionitis, third trimester, not applicable or unspecified: Secondary | ICD-10-CM

## 2017-09-16 DIAGNOSIS — Z3A40 40 weeks gestation of pregnancy: Secondary | ICD-10-CM

## 2017-09-16 LAB — COMPREHENSIVE METABOLIC PANEL
ALBUMIN: 2.4 g/dL — AB (ref 3.5–5.0)
ALT: 11 U/L — AB (ref 14–54)
AST: 25 U/L (ref 15–41)
Alkaline Phosphatase: 80 U/L (ref 38–126)
Anion gap: 8 (ref 5–15)
BILIRUBIN TOTAL: 1.1 mg/dL (ref 0.3–1.2)
BUN: 9 mg/dL (ref 6–20)
CO2: 21 mmol/L — ABNORMAL LOW (ref 22–32)
Calcium: 8.1 mg/dL — ABNORMAL LOW (ref 8.9–10.3)
Chloride: 106 mmol/L (ref 101–111)
Creatinine, Ser: 0.98 mg/dL (ref 0.44–1.00)
GFR calc Af Amer: >60 mL/min (ref >60)
GFR calc non Af Amer: >60 mL/min (ref >60)
GLUCOSE: 133 mg/dL — AB (ref 65–99)
POTASSIUM: 3.5 mmol/L (ref 3.5–5.1)
Sodium: 135 mmol/L (ref 135–145)
TOTAL PROTEIN: 5.4 g/dL — AB (ref 6.5–8.1)

## 2017-09-16 LAB — CBC
HCT: 29.9 % — ABNORMAL LOW (ref 36.0–46.0)
HEMATOCRIT: 28.1 % — AB (ref 36.0–46.0)
HEMOGLOBIN: 9.9 g/dL — AB (ref 12.0–15.0)
Hemoglobin: 9.4 g/dL — ABNORMAL LOW (ref 12.0–15.0)
MCH: 26.3 pg (ref 26.0–34.0)
MCH: 26.8 pg (ref 26.0–34.0)
MCHC: 33.1 g/dL (ref 30.0–36.0)
MCHC: 33.5 g/dL (ref 30.0–36.0)
MCV: 79.5 fL (ref 78.0–100.0)
MCV: 80.1 fL (ref 78.0–100.0)
PLATELETS: 180 10*3/uL (ref 150–400)
Platelets: 173 10*3/uL (ref 150–400)
RBC: 3.76 MIL/uL — ABNORMAL LOW (ref 3.87–5.11)
RBC: 3.51 MIL/uL — ABNORMAL LOW (ref 3.87–5.11)
RDW: 16.3 % — ABNORMAL HIGH (ref 11.5–15.5)
RDW: 16.3 % — AB (ref 11.5–15.5)
WBC: 21.2 10*3/uL — ABNORMAL HIGH (ref 4.0–10.5)
WBC: 22.1 10*3/uL — ABNORMAL HIGH (ref 4.0–10.5)

## 2017-09-16 SURGERY — Surgical Case
Anesthesia: Regional

## 2017-09-16 MED ORDER — OXYTOCIN 40 UNITS IN LACTATED RINGERS INFUSION - SIMPLE MED: 2.5000 [IU]/h | INTRAVENOUS | Status: AC

## 2017-09-16 MED ORDER — LACTATED RINGERS IV SOLN
INTRAVENOUS | Status: DC | PRN
  Administered 2017-09-16: 40 [IU] via INTRAVENOUS

## 2017-09-16 MED ORDER — SCOPOLAMINE 1 MG/3DAYS TD PT72
MEDICATED_PATCH | TRANSDERMAL | Status: AC
  Filled 2017-09-16: qty 1

## 2017-09-16 MED ORDER — LACTATED RINGERS IV SOLN
INTRAVENOUS | Status: DC
  Administered 2017-09-16: 12:00:00 via INTRAVENOUS

## 2017-09-16 MED ORDER — PIPERACILLIN-TAZOBACTAM 3.375 G IVPB 30 MIN
3.3750 g | Freq: Three times a day (TID) | INTRAVENOUS | Status: DC
  Administered 2017-09-16 – 2017-09-17 (×3): 3.375 g via INTRAVENOUS
  Filled 2017-09-16 (×3): qty 50

## 2017-09-16 MED ORDER — ONDANSETRON HCL 4 MG/2ML IJ SOLN
INTRAMUSCULAR | Status: DC | PRN
  Administered 2017-09-16: 4 mg via INTRAVENOUS

## 2017-09-16 MED ORDER — OXYCODONE HCL 5 MG PO TABS
5.0000 mg | ORAL_TABLET | ORAL | Status: DC | PRN
  Administered 2017-09-16 – 2017-09-18 (×4): 5 mg via ORAL
  Filled 2017-09-16 (×4): qty 1

## 2017-09-16 MED ORDER — OXYTOCIN 10 UNIT/ML IJ SOLN
INTRAMUSCULAR | Status: AC
  Filled 2017-09-16: qty 4

## 2017-09-16 MED ORDER — TETANUS-DIPHTH-ACELL PERTUSSIS 5-2.5-18.5 LF-MCG/0.5 IM SUSP: 0.5000 mL | Freq: Once | INTRAMUSCULAR | Status: DC

## 2017-09-16 MED ORDER — NALOXONE HCL 2 MG/2ML IJ SOSY
1.0000 ug/kg/h | PREFILLED_SYRINGE | INTRAVENOUS | Status: DC | PRN
  Filled 2017-09-16: qty 2

## 2017-09-16 MED ORDER — LACTATED RINGERS IV SOLN
INTRAVENOUS | Status: DC | PRN
  Administered 2017-09-16: 01:00:00 via INTRAVENOUS

## 2017-09-16 MED ORDER — DIPHENHYDRAMINE HCL 50 MG/ML IJ SOLN: 12.5000 mg | INTRAMUSCULAR | Status: DC | PRN

## 2017-09-16 MED ORDER — ONDANSETRON HCL 4 MG/2ML IJ SOLN
INTRAMUSCULAR | Status: AC
  Filled 2017-09-16: qty 2

## 2017-09-16 MED ORDER — NALBUPHINE HCL 10 MG/ML IJ SOLN: 5.0000 mg | Freq: Once | INTRAMUSCULAR | Status: DC | PRN

## 2017-09-16 MED ORDER — PRENATAL MULTIVITAMIN CH
1.0000 | ORAL_TABLET | Freq: Every day | ORAL | Status: DC
  Administered 2017-09-16 – 2017-09-18 (×3): 1 via ORAL
  Filled 2017-09-16 (×3): qty 1

## 2017-09-16 MED ORDER — SODIUM CHLORIDE 0.9 % IV SOLN
2.0000 g | Freq: Four times a day (QID) | INTRAVENOUS | Status: DC
  Administered 2017-09-16 (×2): 2 g via INTRAVENOUS
  Filled 2017-09-16 (×4): qty 2000

## 2017-09-16 MED ORDER — MEPERIDINE HCL 25 MG/ML IJ SOLN: 6.2500 mg | INTRAMUSCULAR | Status: DC | PRN

## 2017-09-16 MED ORDER — MEPERIDINE HCL 25 MG/ML IJ SOLN
INTRAMUSCULAR | Status: DC | PRN
  Administered 2017-09-16 (×2): 12.5 mg via INTRAVENOUS

## 2017-09-16 MED ORDER — SODIUM CHLORIDE 0.9% FLUSH: 3.0000 mL | INTRAVENOUS | Status: DC | PRN

## 2017-09-16 MED ORDER — CLINDAMYCIN PHOSPHATE 900 MG/50ML IV SOLN
900.0000 mg | Freq: Three times a day (TID) | INTRAVENOUS | Status: DC
  Administered 2017-09-16: 900 mg via INTRAVENOUS

## 2017-09-16 MED ORDER — ONDANSETRON HCL 4 MG/2ML IJ SOLN: 4.0000 mg | Freq: Four times a day (QID) | INTRAMUSCULAR | Status: DC | PRN

## 2017-09-16 MED ORDER — NALBUPHINE HCL 10 MG/ML IJ SOLN: 5.0000 mg | INTRAMUSCULAR | Status: DC | PRN

## 2017-09-16 MED ORDER — SODIUM BICARBONATE 8.4 % IV SOLN
INTRAVENOUS | Status: DC | PRN
  Administered 2017-09-16: 10 mL via EPIDURAL

## 2017-09-16 MED ORDER — WITCH HAZEL-GLYCERIN EX PADS: 1.0000 "application " | MEDICATED_PAD | CUTANEOUS | Status: DC | PRN

## 2017-09-16 MED ORDER — ACETAMINOPHEN 325 MG PO TABS
650.0000 mg | ORAL_TABLET | ORAL | Status: DC | PRN
  Administered 2017-09-17: 650 mg via ORAL
  Filled 2017-09-16: qty 2

## 2017-09-16 MED ORDER — KETOROLAC TROMETHAMINE 30 MG/ML IJ SOLN: 30.0000 mg | Freq: Four times a day (QID) | INTRAMUSCULAR | Status: AC | PRN

## 2017-09-16 MED ORDER — DEXAMETHASONE SODIUM PHOSPHATE 10 MG/ML IJ SOLN
INTRAMUSCULAR | Status: AC
  Filled 2017-09-16: qty 1

## 2017-09-16 MED ORDER — SIMETHICONE 80 MG PO CHEW
80.0000 mg | CHEWABLE_TABLET | Freq: Three times a day (TID) | ORAL | Status: DC
  Administered 2017-09-16 – 2017-09-18 (×6): 80 mg via ORAL
  Filled 2017-09-16 (×6): qty 1

## 2017-09-16 MED ORDER — OXYCODONE HCL 5 MG/5ML PO SOLN: 5.0000 mg | Freq: Once | ORAL | Status: DC | PRN

## 2017-09-16 MED ORDER — DIPHENHYDRAMINE HCL 25 MG PO CAPS
25.0000 mg | ORAL_CAPSULE | ORAL | Status: DC | PRN
  Filled 2017-09-16 (×2): qty 1

## 2017-09-16 MED ORDER — CLINDAMYCIN PHOSPHATE 900 MG/50ML IV SOLN
900.0000 mg | Freq: Three times a day (TID) | INTRAVENOUS | Status: DC
  Administered 2017-09-16: 900 mg via INTRAVENOUS
  Filled 2017-09-16 (×3): qty 50

## 2017-09-16 MED ORDER — FERROUS SULFATE 325 (65 FE) MG PO TABS
325.0000 mg | ORAL_TABLET | Freq: Two times a day (BID) | ORAL | Status: DC
  Administered 2017-09-17 – 2017-09-18 (×3): 325 mg via ORAL
  Filled 2017-09-16 (×3): qty 1

## 2017-09-16 MED ORDER — OXYCODONE HCL 5 MG PO TABS
10.0000 mg | ORAL_TABLET | ORAL | Status: DC | PRN
Start: 2017-09-16 — End: 2017-09-18
  Administered 2017-09-17 (×2): 10 mg via ORAL
  Filled 2017-09-16 (×2): qty 2

## 2017-09-16 MED ORDER — POLYETHYLENE GLYCOL 3350 17 G PO PACK
17.0000 g | PACK | Freq: Every day | ORAL | Status: DC
Start: 2017-09-17 — End: 2017-09-18
  Administered 2017-09-17 – 2017-09-18 (×2): 17 g via ORAL
  Filled 2017-09-16 (×2): qty 1

## 2017-09-16 MED ORDER — FENTANYL CITRATE (PF) 100 MCG/2ML IJ SOLN
25.0000 ug | INTRAMUSCULAR | Status: DC | PRN
  Administered 2017-09-16: 50 ug via INTRAVENOUS

## 2017-09-16 MED ORDER — SCOPOLAMINE 1 MG/3DAYS TD PT72: 1.0000 | MEDICATED_PATCH | Freq: Once | TRANSDERMAL | Status: DC

## 2017-09-16 MED ORDER — GENTAMICIN SULFATE 40 MG/ML IJ SOLN
1.5000 mg/kg | Freq: Three times a day (TID) | INTRAVENOUS | Status: DC
  Administered 2017-09-16 (×2): 160 mg via INTRAVENOUS
  Filled 2017-09-16 (×3): qty 4

## 2017-09-16 MED ORDER — ONDANSETRON HCL 4 MG/2ML IJ SOLN: 4.0000 mg | Freq: Three times a day (TID) | INTRAMUSCULAR | Status: DC | PRN

## 2017-09-16 MED ORDER — DIPHENHYDRAMINE HCL 25 MG PO CAPS
25.0000 mg | ORAL_CAPSULE | Freq: Four times a day (QID) | ORAL | Status: DC | PRN
  Administered 2017-09-16: 25 mg via ORAL

## 2017-09-16 MED ORDER — DIBUCAINE 1 % RE OINT: 1.0000 "application " | TOPICAL_OINTMENT | RECTAL | Status: DC | PRN

## 2017-09-16 MED ORDER — OXYCODONE HCL 5 MG PO TABS: 5.0000 mg | ORAL_TABLET | Freq: Once | ORAL | Status: DC | PRN

## 2017-09-16 MED ORDER — MORPHINE SULFATE (PF) 0.5 MG/ML IJ SOLN
INTRAMUSCULAR | Status: AC
  Filled 2017-09-16: qty 10

## 2017-09-16 MED ORDER — SENNOSIDES-DOCUSATE SODIUM 8.6-50 MG PO TABS
2.0000 | ORAL_TABLET | Freq: Every evening | ORAL | Status: DC | PRN
  Administered 2017-09-17 – 2017-09-18 (×2): 2 via ORAL
  Filled 2017-09-16: qty 2

## 2017-09-16 MED ORDER — SCOPOLAMINE 1 MG/3DAYS TD PT72
MEDICATED_PATCH | TRANSDERMAL | Status: DC | PRN
  Administered 2017-09-16: 1 via TRANSDERMAL

## 2017-09-16 MED ORDER — NALOXONE HCL 0.4 MG/ML IJ SOLN: 0.4000 mg | INTRAMUSCULAR | Status: DC | PRN

## 2017-09-16 MED ORDER — DEXAMETHASONE SODIUM PHOSPHATE 10 MG/ML IJ SOLN
INTRAMUSCULAR | Status: DC | PRN
  Administered 2017-09-16: 10 mg via INTRAVENOUS

## 2017-09-16 MED ORDER — MENTHOL 3 MG MT LOZG: 1.0000 | LOZENGE | OROMUCOSAL | Status: DC | PRN

## 2017-09-16 MED ORDER — SODIUM CHLORIDE 0.9 % IR SOLN
Status: DC | PRN
  Administered 2017-09-16 (×4): 1

## 2017-09-16 MED ORDER — MORPHINE SULFATE (PF) 0.5 MG/ML IJ SOLN
INTRAMUSCULAR | Status: DC | PRN
  Administered 2017-09-16: 1 mg via INTRAVENOUS
  Administered 2017-09-16: 4 mg via EPIDURAL

## 2017-09-16 MED ORDER — SOD CITRATE-CITRIC ACID 500-334 MG/5ML PO SOLN: 30.0000 mL | ORAL | Status: DC

## 2017-09-16 MED ORDER — IBUPROFEN 600 MG PO TABS
600.0000 mg | ORAL_TABLET | Freq: Four times a day (QID) | ORAL | Status: DC
  Administered 2017-09-16 – 2017-09-18 (×10): 600 mg via ORAL
  Filled 2017-09-16 (×10): qty 1

## 2017-09-16 MED ORDER — COCONUT OIL OIL: 1.0000 "application " | TOPICAL_OIL | Status: DC | PRN

## 2017-09-16 MED ORDER — MEPERIDINE HCL 25 MG/ML IJ SOLN
INTRAMUSCULAR | Status: AC
  Filled 2017-09-16: qty 1

## 2017-09-16 MED ORDER — FENTANYL CITRATE (PF) 100 MCG/2ML IJ SOLN
INTRAMUSCULAR | Status: AC
  Filled 2017-09-16: qty 2

## 2017-09-16 SURGICAL SUPPLY — 37 items
BENZOIN TINCTURE PRP APPL 2/3 (GAUZE/BANDAGES/DRESSINGS) ×2 IMPLANT
CANISTER SUCT 3000ML PPV (MISCELLANEOUS) ×2 IMPLANT
CHLORAPREP W/TINT 26ML (MISCELLANEOUS) ×2 IMPLANT
CLOSURE STERI STRIP 1/2 X4 (GAUZE/BANDAGES/DRESSINGS) ×2 IMPLANT
DERMABOND ADVANCED (GAUZE/BANDAGES/DRESSINGS) ×1
DERMABOND ADVANCED .7 DNX12 (GAUZE/BANDAGES/DRESSINGS) ×1 IMPLANT
DRSG OPSITE POSTOP 4X10 (GAUZE/BANDAGES/DRESSINGS) ×2 IMPLANT
ELECT REM PT RETURN 9FT ADLT (ELECTROSURGICAL) ×2
ELECTRODE REM PT RTRN 9FT ADLT (ELECTROSURGICAL) ×1 IMPLANT
EXTRACTOR VACUUM KIWI (MISCELLANEOUS) ×2 IMPLANT
GLOVE BIOGEL PI IND STRL 7.0 (GLOVE) ×2 IMPLANT
GLOVE BIOGEL PI IND STRL 7.5 (GLOVE) ×1 IMPLANT
GLOVE BIOGEL PI INDICATOR 7.0 (GLOVE) ×2
GLOVE BIOGEL PI INDICATOR 7.5 (GLOVE) ×1
GLOVE SKINSENSE NS SZ7.0 (GLOVE) ×1
GLOVE SKINSENSE STRL SZ7.0 (GLOVE) ×1 IMPLANT
GOWN STRL REUS W/ TWL LRG LVL3 (GOWN DISPOSABLE) ×2 IMPLANT
GOWN STRL REUS W/ TWL XL LVL3 (GOWN DISPOSABLE) ×1 IMPLANT
GOWN STRL REUS W/TWL LRG LVL3 (GOWN DISPOSABLE) ×2
GOWN STRL REUS W/TWL XL LVL3 (GOWN DISPOSABLE) ×1
NS IRRIG 1000ML POUR BTL (IV SOLUTION) ×2 IMPLANT
PACK C SECTION WH (CUSTOM PROCEDURE TRAY) ×2 IMPLANT
PAD ABD 7.5X8 STRL (GAUZE/BANDAGES/DRESSINGS) ×2 IMPLANT
PAD OB MATERNITY 4.3X12.25 (PERSONAL CARE ITEMS) ×4 IMPLANT
PAD PREP 24X48 CUFFED NSTRL (MISCELLANEOUS) ×2 IMPLANT
PENCIL SMOKE EVAC W/HOLSTER (ELECTROSURGICAL) ×2 IMPLANT
RTRCTR C-SECT PINK 25CM LRG (MISCELLANEOUS) ×2 IMPLANT
STRIP CLOSURE SKIN 1/2X4 (GAUZE/BANDAGES/DRESSINGS) ×2 IMPLANT
SUT MNCRL 0 VIOLET CTX 36 (SUTURE) ×4 IMPLANT
SUT MON AB 4-0 PS1 27 (SUTURE) ×2 IMPLANT
SUT MONOCRYL 0 CTX 36 (SUTURE) ×4
SUT PLAIN 2 0 (SUTURE) ×1
SUT PLAIN ABS 2-0 CT1 27XMFL (SUTURE) ×1 IMPLANT
SUT VIC AB 0 CT1 36 (SUTURE) ×4 IMPLANT
SUT VIC AB 3-0 CT1 27 (SUTURE) ×2
SUT VIC AB 3-0 CT1 TAPERPNT 27 (SUTURE) ×2 IMPLANT
TOWEL OR 17X24 6PK STRL BLUE (TOWEL DISPOSABLE) ×4 IMPLANT

## 2017-09-16 NOTE — Op Note (Signed)
Operative Note   SURGERY DATE: 09/16/2017  PRE-OP DIAGNOSIS:  *Pregnancy at 40/2 *Arrest of descent *Pre-eclampsia without severe features *Chorioaminionitis *BMI 40s *Moderate meconium  POST-OP DIAGNOSIS: Same. Occiput posterior. Delivered.   PROCEDURE: primary low transverse cesarean section via pfannenstiel skin incision with double layer uterine closure  SURGEON: Surgeon(s) and Role:    * Goodland Bing, MD - Primary  ASSISTANT: None  ANESTHESIA: epidural  ESTIMATED BLOOD LOSS:  DRAINS: UOP via indwelling foley  TOTAL IV FLUIDS: crystalloid  VTE PROPHYLAXIS: SCDs to bilateral lower extremities  ANTIBIOTICS: Ampicillin 2gm and clindamycin , within 1 hour of skin incision. Patient received gentamicin approximately 5 hours before surgery  SPECIMENS: placenta  COMPLICATIONS: bilateral hysterotomy extensions. Atony   INDICATIONS: Patient declined forceps assistance at +2. Patient pushing for nearly 3 hours and with poor effort and maternal exhaustion.  FINDINGS: No intra-abdominal adhesions were noted. Grossly normal uterus, tubes and ovaries. Moderate meconium stained amniotic fluid, cephalic (direct OP) female infant, weight 3580gm, APGARs 8/9, intact placenta.  PROCEDURE IN DETAIL: The patient was taken to the operating room where anesthesia was administered and normal fetal heart tones were confirmed. She was then prepped and draped in the normal fashion in the dorsal supine position with a leftward tilt.  After a time out was performed, a pfannensteil  skin incision was made with the scalpel and carried through to the underlying layer of fascia. The fascia was then incised at the midline and this incision was extended laterally with the mayo scissors. Attention was turned to the superior aspect of the fascial incision which was grasped with the kocher clamps x 2, tented up and the rectus muscles were dissected off with the bovie. In a similar  fashion the inferior aspect of the fascial incision was grasped with the kocher clamps, tented up and the rectus muscles dissected off with the mayo scissors. The rectus muscles were then separated in the midline and the peritoneum was entered bluntly. The alexis retractor was inserted and the vesicouterine peritoneum was identified, tented up and entered with the metzenbaum scissors. This incision was extended laterally and the bladder flap was created digitally. The bladder blade was inserted.  A low transverse hysterotomy was made with the scalpel until the endometrial cavity was breached yielding moderate meconium stained amniotic fluid. This incision was extended bluntly and the infant's head, shoulders and body were delivered atraumatically.The cord was clamped x 2 and cut, and the infant was handed to the awaiting pediatricians, after delayed cord clamping was done.  The placenta was then gradually expressed from the uterus and then the uterus was exteriorized and cleared of all clots and debris. The hysterotomy was repaired with a running suture of 1-0 monocyrl. A second imbricating layer of 1-0 monocyrl suture was then placed. Several figure-of-eight sutures of 1-0 monocryl and bilateral O'Leary stitches were added to achieve excellent hemostasis.   The uterus and adnexa were then returned to the abdomen, and the hysterotomy and all operative sites were reinspected and excellent hemostasis was noted after copious irrigation and suction of the abdomen with warm saline.  The peritoneum was closed with a running stitch of 3-0 Vicryl. The fascia was reapproximated with 0 Vicryl in a simple running fashion bilaterally. The subcutaneous layer was then reapproximated with interrupted sutures of 2-0 plain gut, and the skin was then closed with 4-0 monocryl, in a subcuticular fashion.  The patient  tolerated the procedure well. Sponge, lap, needle, and instrument counts were correct  x 2. The patient was  transferred to the recovery room awake, alert and breathing independently in stable condition.  Cornelia Copa MD Attending Center for [facility])

## 2017-09-16 NOTE — Progress Notes (Addendum)
L&D Note  09/16/2017 - 12:13 AM  21 y.o. G1P0000 [redacted]w[redacted]d. Pregnancy complicated by bmi 40s, alpha thal trait  Patient Active Problem List   Diagnosis Date Noted  . Normal labor 09/14/2017  . Hereditary disease in family possibly affecting fetus, affecting management of mother, antepartum condition or complication, not applicable or unspecified fetus   . Alpha thalassemia trait 03/17/2017  . Supervision of low-risk pregnancy 02/17/2017  . Morbid obesity with BMI of 40.0-44.9, adult (HCC) 06/26/2015    [name] is admitted for labor and diagnosed with mild pre-eclampsia during labor   Subjective:  Pt feels contractions (not painful), getting tired.  Objective:   Vitals:   09/15/17 2130 09/15/17 2200 09/15/17 2230 09/15/17 2300  BP: 131/74 (!) 145/91 (!) 150/76 138/65  Pulse: 65 (!) 59 (!) 54 85  Resp: Temp:    (!) 100.5 F (38.1 C)  TempSrc:    Oral  Weight:      Height:        Current Vital Signs 24h Vital Sign Ranges  T (!) 100.5 F (38.1 C) Temp  Avg: 99.1 F (37.3 C)  Min: 98.1 F (36.7 C)  Max: 100.7 F (38.2 C)  BP 138/65 BP  Min: 89/47  Max: 159/89  HR 85 Pulse  Avg: 73.6  Min: 54  Max: 97  RR 16 Resp  Avg: 16.1  Min: 14  Max: 20  SaO2     No Data Recorded       24 Hour I/O Current Shift I/O  Time Ins Outs 10/04 0701 - 10/05 0700 In: -  Out: 850 [Urine:850] 10/04 1901 - 10/05 0700 In: -  Out: 850 [Urine:850]   FHR: 150 baseline, +scalp stim, occasional subtle early decels Toco: q3-27m Gen: NAD SVE: complete/feels LOA/+2 to +3, pelvis feels adequate, no caput. Minimal descent with pushing (poor maternal effort).  Moderate meconium  Labs:   Recent Labs Lab 09/14/17 1935  WBC 10.4  HGB 11.8*  HCT 36.0  PLT 227    Recent Labs Lab 09/15/17 2105  NA 138  K 3.8  CL 106  CO2 22  BUN 8  CREATININE 1.04*  CALCIUM 8.5*  PROT 5.9*  BILITOT 1.3*  ALKPHOS 102  ALT 12*  AST 22  GLUCOSE 93    Medications Current  Facility-Administered Medications  Medication Dose Route Frequency Provider Last Rate Last Dose  . acetaminophen (TYLENOL) tablet 650 mg  650 mg Oral Q4H PRN Lockamy, Timothy, DO   650 mg at 09/15/17 2308  . ampicillin (OMNIPEN) 2 g in sodium chloride 0.9 % 50 mL IVPB  2 g Intravenous Q6H Lockamy, Timothy, DO 150 mL/hr at 09/16/17 0008 2 g at 09/16/17 0008  . diphenhydrAMINE (BENADRYL) injection 12.5 mg  12.5 mg Intravenous Q15 min PRN Phillips Grout, MD      . ePHEDrine injection 10 mg  10 mg Intravenous PRN Phillips Grout, MD      . ePHEDrine injection 10 mg  10 mg Intravenous PRN Phillips Grout, MD      . fentaNYL (SUBLIMAZE) injection 50-100 mcg  50-100 mcg Intravenous Q1H PRN Lockamy, Timothy, DO      . fentaNYL 2.5 mcg/ml w/bupivacaine 0.1% in NS epidural infusion (WH-ANES)  14 mL/hr Epidural Continuous PRN Phillips Grout, MD 14 mL/hr at 09/15/17 1821 14 mL/hr at 09/15/17 1821  . gentamicin (GARAMYCIN) 160 mg in dextrose 5 % 50 mL IVPB  1.5 mg/kg Intravenous Q8H Arlyce Harman,  DO   Stopped at 09/15/17 2045  . lactated ringers infusion 500-1,000 mL  500-1,000 mL Intravenous PRN Lockamy, Timothy, DO      . lactated ringers infusion   Intravenous Continuous Lockamy, Marcial Pacas, DO 125 mL/hr at 09/14/17 2130    . lidocaine (PF) (XYLOCAINE) 1 % injection 30 mL  30 mL Subcutaneous PRN Lockamy, Timothy, DO      . ondansetron (ZOFRAN) injection 4 mg  4 mg Intravenous Q6H PRN Lockamy, Timothy, DO      . oxyCODONE-acetaminophen (PERCOCET/ROXICET) 5-325 MG per tablet 1 tablet  1 tablet Oral Q4H PRN Lockamy, Timothy, DO      . oxyCODONE-acetaminophen (PERCOCET/ROXICET) 5-325 MG per tablet 2 tablet  2 tablet Oral Q4H PRN Lockamy, Timothy, DO      . oxytocin (PITOCIN) IV BOLUS FROM BAG  500 mL Intravenous Once Lockamy, Timothy, DO      . oxytocin (PITOCIN) IV infusion 40 units in LR 1000 mL - Premix  2.5 Units/hr Intravenous Continuous Lockamy, Timothy, DO      . oxytocin (PITOCIN) IV infusion 40  units in LR 1000 mL - Premix  1-40 milli-units/min Intravenous Continuous Renne Musca, MD 27 mL/hr at 09/15/17 2230 18 milli-units/min at 09/15/17 2230  . PHENYLephrine 40 mcg/ml in normal saline Adult IV Push Syringe  80 mcg Intravenous PRN Phillips Grout, MD      . PHENYLephrine 40 mcg/ml in normal saline Adult IV Push Syringe  80 mcg Intravenous PRN Phillips Grout, MD      . sodium citrate-citric acid (ORACIT) solution 30 mL  30 mL Oral Q2H PRN Lockamy, Timothy, DO   30 mL at 09/14/17 2326  . sodium phosphate (FLEET) 7-19 GM/118ML enema 1 enema  1 enema Rectal Daily PRN Arlyce Harman, DO       Facility-Administered Medications Ordered in Other Encounters  Medication Dose Route Frequency Provider Last Rate Last Dose  . lidocaine (PF) (XYLOCAINE) 1 % injection    Anesthesia Intra-op Phillips Grout, MD   5 mL at 09/14/17 2113    Assessment & Plan:  Pt stable *IUP: category I tracing *Labor: options d/w mom and I told her that I recommend forceps assistance, since she's been pushing for two hours, she's tired and poor effort, in addition to having chorio and moderate meconium. Benefits/risks d/w pt and she elects for c-section. Pt just got amp and if incision within on hour, it can be used for ssi ppx. Will add on clinda. D/c pitocin.  *GBS: neg *Analgesia: epidural working well.   Cornelia Copa MD Attending Center for [facility])

## 2017-09-16 NOTE — Anesthesia Postprocedure Evaluation (Signed)
Anesthesia Post Note  Patient: Jennifer Lynn  Procedure(s) Performed: CESAREAN SECTION (N/A )     Patient location during evaluation: [facility] Anesthesia Type: Epidural Level of consciousness: awake and alert Pain management: pain level controlled Vital Signs Assessment: post-procedure vital signs reviewed and stable Respiratory status: spontaneous breathing Cardiovascular status: blood pressure returned to baseline Postop Assessment: no headache, epidural receding, patient able to bend at knees and no apparent nausea or vomiting Anesthetic complications: no Comments: Pain score 2.    Last Vitals:  Vitals:   09/16/17 0355 09/16/17 0600  BP: 127/65   Pulse: 61   Resp:  16  Temp:    SpO2:      Last Pain:  Vitals:   09/16/17 0715  TempSrc:   PainSc: 3    Pain Goal: Patients Stated Pain Goal: 1 (09/16/17 0600)               [name]

## 2017-09-16 NOTE — Transfer of Care (Signed)
Immediate Anesthesia Transfer of Care Note  Patient: Jennifer Lynn  Procedure(s) Performed: CESAREAN SECTION (N/A )  Patient Location: PACU  Anesthesia Type:Epidural  Level of Consciousness: awake, alert  and oriented  Airway & Oxygen Therapy: Patient Spontanous Breathing  Post-op Assessment: Report given to RN and Post -op Vital signs reviewed and stable  Post vital signs: Reviewed and stable  Last Vitals:  Vitals:   09/15/17 2300 09/16/17 0035  BP: 138/65 130/86  Pulse: 85 87  Resp: 16 16  Temp: (!) 38.1 C     Last Pain:  Vitals:   09/15/17 2300  TempSrc: Oral  PainSc: 0-No pain      Patients Stated Pain Goal: 1 (09/15/17 0729)  Complications: No apparent anesthesia complications

## 2017-09-17 LAB — COMPREHENSIVE METABOLIC PANEL
ALBUMIN: 2.1 g/dL — AB (ref 3.5–5.0)
ALT: 13 U/L — AB (ref 14–54)
AST: 28 U/L (ref 15–41)
Alkaline Phosphatase: 108 U/L (ref 38–126)
Anion gap: 8 (ref 5–15)
BUN: 11 mg/dL (ref 6–20)
CHLORIDE: 105 mmol/L (ref 101–111)
CO2: 25 mmol/L (ref 22–32)
CREATININE: 0.86 mg/dL (ref 0.44–1.00)
Calcium: 7.6 mg/dL — ABNORMAL LOW (ref 8.9–10.3)
GFR calc Af Amer: >60 mL/min (ref >60)
GFR calc non Af Amer: >60 mL/min (ref >60)
GLUCOSE: 82 mg/dL (ref 65–99)
POTASSIUM: 3.4 mmol/L — AB (ref 3.5–5.1)
Sodium: 138 mmol/L (ref 135–145)
Total Bilirubin: 0.5 mg/dL (ref 0.3–1.2)
Total Protein: 4.8 g/dL — ABNORMAL LOW (ref 6.5–8.1)

## 2017-09-17 LAB — CBC
HCT: 22.5 % — ABNORMAL LOW (ref 36.0–46.0)
Hemoglobin: 7.6 g/dL — ABNORMAL LOW (ref 12.0–15.0)
MCH: 26.8 pg (ref 26.0–34.0)
MCHC: 33.8 g/dL (ref 30.0–36.0)
MCV: 79.2 fL (ref 78.0–100.0)
PLATELETS: 162 10*3/uL (ref 150–400)
RBC: 2.84 MIL/uL — AB (ref 3.87–5.11)
RDW: 16.3 % — AB (ref 11.5–15.5)
WBC: 13 10*3/uL — AB (ref 4.0–10.5)

## 2017-09-17 NOTE — Lactation Note (Signed)
This note was copied from a baby's chart. Lactation Consultation Note  Patient Name: Jennifer Lynn [id number] Date: 09/17/2017 Reason for consult: Initial assessment   P1, Baby 38 hours old.  Grandmother giving baby a formula bottle upon entering. Parents states baby breastfed often through the night. They thought baby was not getting enough breastmilk so they started giving baby formula in addition.  Per parents baby has had 4 voids and 2 stools.  Provided education regarding supply and demand, cluster feeding and how breastmilk comes to volume. Mom encouraged to feed baby 8-12 times/24 hours and with feeding cues.  Suggest that if parents continue giving formula to give after breastfeeding on both breasts to help establish milk supply. Reviewed hand expression with drops expressed. Assisted w/ latching in football hold on R side for 5 min and baby fell asleep since she recently had formula. Attempted to assess infant's mouth but she is too sleepy. Left LC phone number and suggest mother call when baby wakes. Mom encouraged to feed baby 8-12 times/24 hours and with feeding cues.  Mom made aware of O/P services, breastfeeding support groups, community resources, and our phone # for post-discharge questions.     Maternal Data Has patient been taught Hand Expression?: Yes Does the patient have breastfeeding experience prior to this delivery?: No  Feeding Feeding Type: Breast Fed Length of feed: 5 min  LATCH Score Latch: Grasps breast easily, tongue down, lips flanged, rhythmical sucking.  Audible Swallowing: A few with stimulation  Type of Nipple: Everted at rest and after stimulation  Comfort (Breast/Nipple): Soft / non-tender  Hold (Positioning): Assistance needed to correctly position infant at breast and maintain latch.  LATCH Score: 8  Interventions Interventions: Breast feeding basics reviewed;Assisted with latch;Hand express  Lactation Tools Discussed/Used      Consult Status Consult Status: Follow-up Date: 09/18/17 Follow-up type: In-patient    Dahlia Byes [facility] 09/17/2017, 4:55 PM

## 2017-09-17 NOTE — Progress Notes (Signed)
Subjective: Postpartum Day 1: Cesarean Delivery Patient reports tolerating PO, + flatus and no problems voiding.  She notes being unsteady and weak in the legs when trying to walk, so catheter left in since c/s yesterday near midnight. No leg pain, swelling. Objective: Vital signs in last 24 hours: Temp:  [98.3 F (36.8 C)-99.3 F (37.4 C)] 98.3 F (36.8 C) (10/06 0605) Pulse Rate:  [62-77] 62 (10/06 0605) Resp:  [16-18] 16 (10/06 0605) BP: (93-126)/(51-71) 99/51 (10/06 0605) SpO2:  [95 %-100 %] 100 % (10/06 1610)  Physical Exam:  General: alert, cooperative and appears stated age Lochia: appropriate Uterine Fundus: firm deep in abdomen. Incision: healing well, no significant drainage, clean clear dressing in place, nontender DVT Evaluation: No evidence of DVT seen on physical exam. No cords or calf tenderness. Calf/Ankle edema is present. Strength in UE and LE 5/5 and symmetric. Sensation normal to my exam,  Recent Labs  09/16/17 0606 09/17/17 0551  HGB 9.4* 7.6*  HCT 28.1* 22.5*   CBC Latest Ref Rng & Units 09/17/2017 09/16/2017 09/16/2017  WBC 4.0 - 10.5 K/uL 13.0(H) 22.1(H) 21.2(H)  Hemoglobin 12.0 - 15.0 g/dL 7.6(L) 9.4(L) 9.9(L)  Hematocrit 36.0 - 46.0 % 22.5(L) 28.1(L) 29.9(L)  Platelets 150 - 400 K/uL 162 173 180     Assessment/Plan: Status post Cesarean section s/p zosyn x 24 hrs for triple I. Now completed abx.   Marland Kitchen Postoperative course complicated by leg perceived "weakness" not confirmable on exam.  Postop anemia due to blood loss at delivery.  D/c Zosyn D/c IV D/c foley. Asked for pt to have partner or nurse walk with her to bathroom. IF problems, will use bedside commode.  Anesthesia could evaluate, but she seems normal to my exam. Probable d/c in a.mSunday.Tilda Burrow 09/17/2017, 7:55 AM

## 2017-09-18 DIAGNOSIS — O9081 Anemia of the puerperium: Secondary | ICD-10-CM | POA: Diagnosis not present

## 2017-09-18 DIAGNOSIS — O411290 Chorioamnionitis, unspecified trimester, not applicable or unspecified: Secondary | ICD-10-CM | POA: Diagnosis not present

## 2017-09-18 MED ORDER — FERROUS SULFATE 325 (65 FE) MG PO TABS: 325.0000 mg | ORAL_TABLET | Freq: Two times a day (BID) | ORAL | 3 refills | Status: DC

## 2017-09-18 MED ORDER — DOCUSATE SODIUM 100 MG PO CAPS: 100.0000 mg | ORAL_CAPSULE | Freq: Two times a day (BID) | ORAL | 2 refills | Status: DC | PRN

## 2017-09-18 MED ORDER — SODIUM CHLORIDE 0.9 % IV SOLN
510.0000 mg | Freq: Once | INTRAVENOUS | Status: AC
  Administered 2017-09-18: 510 mg via INTRAVENOUS
  Filled 2017-09-18: qty 17

## 2017-09-18 MED ORDER — OXYCODONE-ACETAMINOPHEN 5-325 MG PO TABS: 1.0000 | ORAL_TABLET | Freq: Four times a day (QID) | ORAL | 0 refills | Status: DC | PRN

## 2017-09-18 MED ORDER — IBUPROFEN 600 MG PO TABS: 600.0000 mg | ORAL_TABLET | Freq: Four times a day (QID) | ORAL | 2 refills | Status: DC | PRN

## 2017-09-18 NOTE — Discharge Summary (Signed)
OB Discharge Summary     Patient Name: Jennifer Lynn DOB: [birth year] MRN: [id number]  Date of admission: 09/14/2017 Delivering MD: Willards Bing   Date of discharge: 09/18/2017  Admitting diagnosis: 40WKS LABOR Intrauterine pregnancy: [redacted]w[redacted]d     Secondary diagnosis:  Active Problems:   Mild preeclampsia   Anemia, postpartum   Chorioamnionitis, delivered, current hospitalization  Additional problems: Perceived leg weakness with normal  exam     Discharge diagnosis: Term Pregnancy Delivered, Preeclampsia (mild) and Anemia                                                                                                Post partum procedures:Feraheme administration  Augmentation: AROM and Pitocin  Complications: Intrauterine Inflammation or infection (Chorioamniotis) and ROM>24 hours  Hospital course:  Onset of Labor With Unplanned C/S  21 y.o. yo G1P1001 at [redacted]w[redacted]d was admitted in Active Labor on 09/14/2017. Patient had a labor course significant for mild preeclampsia and Triple I . Membrane Rupture Time/Date: 11:55 PM ,09/15/2017  .The patient went for cesarean section due to Arrest of Descent, and delivered a Viable infant,09/16/2017 .Details of operation can be found in separate operative note. Patient had an uncomplicated postpartum course. She did report leg weakness but was seen to ambulate well, and normal exam, ?temporary perceived weakness after regional anesthesia and delivery.  Also had anemia with hemoglobin of 7.6, Feraheme given. Stable BP after delivery.   She is ambulating,tolerating a regular diet, passing flatus, and urinating well.  Patient is discharged home in stable condition 09/18/17.  Physical exam  Vitals:   09/17/17 1645 09/17/17 1918 09/17/17 2348 09/18/17 0345  BP: 122/63 108/72 127/77 115/69  Pulse: 78 76 89 72  Resp: Temp: 98.4 F (36.9 C) 98.7 F (37.1 C) 98.4 F (36.9 C) 98.3 F (36.8 C)  TempSrc: Oral Oral Oral Oral  SpO2: 100%  100% 99% 100%  Weight:      Height:       General: alert, cooperative and no distress Lochia: appropriate Uterine Fundus: firm Incision: Healing well with no significant drainage, No significant erythema, Dressing is clean, dry, and intact Lower Extremities: No evidence of DVT seen on physical exam. Negative Homan's sign. No cords or calf tenderness. No significant calf/ankle edema. 2+DTR, normal strength, normal movement bilaterally, normal sensation  Labs: CBC Latest Ref Rng & Units 09/17/2017 09/16/2017 09/16/2017  WBC 4.0 - 10.5 K/uL 13.0(H) 22.1(H) 21.2(H)  Hemoglobin 12.0 - 15.0 g/dL 7.6(L) 9.4(L) 9.9(L)  Hematocrit 36.0 - 46.0 % 22.5(L) 28.1(L) 29.9(L)  Platelets 150 - 400 K/uL 162 173 180    CMP Latest Ref Rng & Units 09/17/2017  Glucose 65 - 99 mg/dL 82  BUN 6 - 20 mg/dL 11  Creatinine 4.09 - 8.11 mg/dL 9.14  Sodium 782 - 956 mmol/L 138  Potassium 3.5 - 5.1 mmol/L 3.4(L)  Chloride 101 - 111 mmol/L 105  CO2 22 - 32 mmol/L 25  Calcium 8.9 - 10.3 mg/dL 7.6(L)  Total Protein 6.5 - 8.1 g/dL 4.8(L)  Total Bilirubin 0.3 - 1.2 mg/dL 0.5  Alkaline Phos 38 - 126 U/L 108  AST 15 - 41 U/L 28  ALT 14 - 54 U/L 13(L)    Discharge instruction: per After Visit Summary and "Baby and Me Booklet".  After visit meds:  Allergies as of 09/18/2017   No Known Allergies     Medication List    TAKE these medications   Biotin 1000 MCG tablet Take 1,000 mcg by mouth 3 (three) times daily.   docusate sodium 100 MG capsule Commonly known as:  COLACE Take 1 capsule (100 mg total) by mouth 2 (two) times daily as needed.   ferrous sulfate 325 (65 FE) MG tablet Take 1 tablet (325 mg total) by mouth daily with breakfast. What changed:  Another medication with the same name was added. Make sure you understand how and when to take each.   ferrous sulfate 325 (65 FE) MG tablet Take 1 tablet (325 mg total) by mouth 2 (two) times daily with a meal. What changed:  You were already taking a  medication with the same name, and this prescription was added. Make sure you understand how and when to take each.   ibuprofen 600 MG tablet Commonly known as:  ADVIL,MOTRIN Take 1 tablet (600 mg total) by mouth every 6 (six) hours as needed.   oxyCODONE-acetaminophen 5-325 MG tablet Commonly known as:  PERCOCET/ROXICET Take 1-2 tablets by mouth every 6 (six) hours as needed.   prenatal multivitamin Tabs tablet Take 1 tablet by mouth daily at 12 noon.       Diet: routine diet  Activity: Advance as tolerated. Pelvic rest for 6 weeks.   Outpatient follow up:1 week for BP check then 4 weeks postpartum visit Follow up Appt:No future appointments. Follow up Visit:No Follow-up on file.  Postpartum contraception: IUD Mirena  Newborn Data: Live born female  Birth Weight: 7 lb 14.3 oz (3580 g) APGAR: 8, 9  Newborn Delivery   Birth date/time:  09/16/2017 01:10:00 Delivery type:  C-Section, Low Transverse  C-section categorization:  Primary     Baby Feeding: Breast Disposition:home with mother   09/18/2017 Jaynie Collins, MD

## 2017-09-18 NOTE — Discharge Instructions (Signed)

## 2017-09-18 NOTE — Lactation Note (Signed)
This note was copied from a baby's chart. Lactation Consultation Note  Patient Name: Jennifer Lynn [id number] Date: 09/18/2017 Reason for consult: Follow-up assessment   Noted oral restriction with short lingual frenulum at tip of tongue. Mother's nipples sore, crack at base of L nipple.  Gave her comfort gels. Suggest discussing with Ped MD. Provided resource sheets. Mom encouraged to feed baby 8-12 times/24 hours and with feeding cues at least q 3 hours. Reviewed waking techniques and encouraged feeding STS     Maternal Data    Feeding    LATCH Score                   Interventions    Lactation Tools Discussed/Used     Consult Status Consult Status: Follow-up Date: 09/19/17 Follow-up type: In-patient    Dahlia Byes [facility] 09/18/2017, 11:01 AM

## 2017-09-19 ENCOUNTER — Telehealth: Payer: Self-pay | Admitting: *Deleted

## 2017-09-19 NOTE — Telephone Encounter (Signed)
Received message left on nurse voicemail from 09/18/17 at 1954.  States she has just been discharged from the hospital from giving birth.  States her prescriptions were sent to a pharmancy that is currently closed.  Wants to know if we can send prescriptions to another pharmacy that is open 24 hours.  Requests a return call.

## 2017-09-20 ENCOUNTER — Encounter: Payer: Medicaid Other | Admitting: Medical

## 2017-09-20 NOTE — Telephone Encounter (Signed)
Called patient, no answer- left message stating we are trying to reach you to see if you still need assistance. Please call us back

## 2017-10-03 ENCOUNTER — Ambulatory Visit: Payer: Medicaid Other

## 2017-10-17 ENCOUNTER — Ambulatory Visit (INDEPENDENT_AMBULATORY_CARE_PROVIDER_SITE_OTHER): Payer: Medicaid Other | Admitting: Medical

## 2017-10-17 ENCOUNTER — Encounter: Payer: Self-pay | Admitting: Medical

## 2017-10-17 DIAGNOSIS — Z1389 Encounter for screening for other disorder: Secondary | ICD-10-CM | POA: Diagnosis not present

## 2017-10-17 DIAGNOSIS — Z8759 Personal history of other complications of pregnancy, childbirth and the puerperium: Secondary | ICD-10-CM | POA: Insufficient documentation

## 2017-10-17 HISTORY — DX: Personal history of other complications of pregnancy, childbirth and the puerperium: Z87.59

## 2017-10-17 NOTE — Progress Notes (Signed)
Post Partum Exam  Jennifer Lynn is a 21 y.o. 141P1001 female who presents for a postpartum visit. She is 4 weeks postpartum following a low cervical transverse Cesarean section. I have fully reviewed the prenatal and intrapartum course. The delivery was at 40w2 gestational weeks.  Anesthesia: spinal. Postpartum course has been unremarkable. Baby's course has been unremarkable. Baby is feeding by bottle - Similac Alimentum. Bleeding no bleeding. Bowel function is normal. Bladder function is normal. Patient is not sexually active. Contraception method is abstinence. Does not desire BC at this time. Postpartum depression screening:neg  The following portions of the patient's history were reviewed and updated as appropriate: allergies, current medications, past family history, past medical history, past social history, past surgical history and problem list.  Review of Systems Pertinent items are noted in HPI.     Objective:  Blood pressure 135/84, pulse 74, height 5\' 4"  (1.626 m), weight 215 lb 9.6 oz (97.8 kg), not currently breastfeeding.  General:  alert and cooperative   Breasts:  not evaluated  Lungs: clear to auscultation bilaterally  Heart:  regular rate and rhythm, S1, S2 normal, no murmur, click, rub or gallop  Abdomen: normal findings: no masses palpable, no organomegaly and soft, non-tender   Vulva:  not evaluated  Vagina: not evaluated  Cervix:  not evaluated  Corpus: not examined  Adnexa:  not evaluated  Rectal Exam: Not performed.        Assessment:    Normal postpartum exam. Pap smear 02/17/17 - NORMAL. Birth control counseling   Plan:   1. Contraception: condoms 2. Discussed all birth control options, patient may consider Nuvaring, information given. Patient to call if she would like Rx.  3.  Follow up in: 1 year for annual exam or as needed.   Marny LowensteinWenzel, Julie N, PA-C 10/17/2017 6:31 PM

## 2017-10-17 NOTE — Patient Instructions (Signed)
Ethinyl Estradiol; Etonogestrel vaginal ring What is this medicine? ETHINYL ESTRADIOL; ETONOGESTREL (ETH in il es tra DYE ole; et oh noe JES trel) vaginal ring is a flexible, vaginal ring used as a contraceptive (birth control method). This medicine combines two types of female hormones, an estrogen and a progestin. This ring is used to prevent ovulation and pregnancy. Each ring is effective for one month. This medicine may be used for other purposes; ask your health care provider or pharmacist if you have questions. COMMON BRAND NAME(S): NuvaRing What should I tell my health care provider before I take this medicine? They need to know if you have or ever had any of these conditions: -abnormal vaginal bleeding -blood vessel disease or blood clots -breast, cervical, endometrial, ovarian, liver, or uterine cancer -diabetes -gallbladder disease -heart disease or recent heart attack -high blood pressure -high cholesterol -kidney disease -liver disease -migraine headaches -stroke -systemic lupus erythematosus (SLE) -tobacco smoker -an unusual or allergic reaction to estrogens, progestins, other medicines, foods, dyes, or preservatives -pregnant or trying to get pregnant -breast-feeding How should I use this medicine? Insert the ring into your vagina as directed. Follow the directions on the prescription label. The ring will remain place for 3 weeks and is then removed for a 1-week break. A new ring is inserted 1 week after the last ring was removed, on the same day of the week. Check often to make sure the ring is still in place, especially before and after sexual intercourse. If the ring was out of the vagina for an unknown amount of time, you may not be protected from pregnancy. Perform a pregnancy test and call your doctor. Do not use more often than directed. A patient package insert for the product will be given with each prescription and refill. Read this sheet carefully each time. The  sheet may change frequently. Contact your pediatrician regarding the use of this medicine in children. Special care may be needed. This medicine has been used in female children who have started having menstrual periods. Overdosage: If you think you have taken too much of this medicine contact a poison control center or emergency room at once. NOTE: This medicine is only for you. Do not share this medicine with others. What if I miss a dose? You will need to replace your vaginal ring once a month as directed. If the ring should slip out, or if you leave it in longer or shorter than you should, contact your health care professional for advice. What may interact with this medicine? Do not take this medicine with the following medication: -dasabuvir; ombitasvir; paritaprevir; ritonavir -ombitasvir; paritaprevir; ritonavir This medicine may also interact with the following medications: -acetaminophen -antibiotics or medicines for infections, especially rifampin, rifabutin, rifapentine, and griseofulvin, and possibly penicillins or tetracyclines -aprepitant -ascorbic acid (vitamin C) -atorvastatin -barbiturate medicines, such as phenobarbital -bosentan -carbamazepine -caffeine -clofibrate -cyclosporine -dantrolene -doxercalciferol -felbamate -grapefruit juice -hydrocortisone -medicines for anxiety or sleeping problems, such as diazepam or temazepam -medicines for diabetes, including pioglitazone -modafinil -mycophenolate -nefazodone -oxcarbazepine -phenytoin -prednisolone -ritonavir or other medicines for HIV infection or AIDS -rosuvastatin -selegiline -soy isoflavones supplements -St. John's wort -tamoxifen or raloxifene -theophylline -thyroid hormones -topiramate -warfarin This list may not describe all possible interactions. Give your health care provider a list of all the medicines, herbs, non-prescription drugs, or dietary supplements you use. Also tell them if you smoke,  drink alcohol, or use illegal drugs. Some items may interact with your medicine. What should I watch for while using   this medicine? Visit your doctor or health care professional for regular checks on your progress. You will need a regular breast and pelvic exam and Pap smear while on this medicine. Use an additional method of contraception during the first cycle that you use this ring. Do not use a diaphragm or female condom, as the ring can interfere with these birth control methods and their proper placement. If you have any reason to think you are pregnant, stop using this medicine right away and contact your doctor or health care professional. If you are using this medicine for hormone related problems, it may take several cycles of use to see improvement in your condition. Smoking increases the risk of getting a blood clot or having a stroke while you are using hormonal birth control, especially if you are more than 23 years old. You are strongly advised not to smoke. This medicine can make your body retain fluid, making your fingers, hands, or ankles swell. Your blood pressure can go up. Contact your doctor or health care professional if you feel you are retaining fluid. This medicine can make you more sensitive to the sun. Keep out of the sun. If you cannot avoid being in the sun, wear protective clothing and use sunscreen. Do not use sun lamps or tanning beds/booths. If you wear contact lenses and notice visual changes, or if the lenses begin to feel uncomfortable, consult your eye care specialist. In some women, tenderness, swelling, or minor bleeding of the gums may occur. Notify your dentist if this happens. Brushing and flossing your teeth regularly may help limit this. See your dentist regularly and inform your dentist of the medicines you are taking. If you are going to have elective surgery, you may need to stop using this medicine before the surgery. Consult your health care professional  for advice. This medicine does not protect you against HIV infection (AIDS) or any other sexually transmitted diseases. What side effects may I notice from receiving this medicine? Side effects that you should report to your doctor or health care professional as soon as possible: -breast tissue changes or discharge -changes in vaginal bleeding during your period or between your periods -chest pain -coughing up blood -dizziness or fainting spells -headaches or migraines -leg, arm or groin pain -severe or sudden headaches -stomach pain (severe) -sudden shortness of breath -sudden loss of coordination, especially on one side of the body -speech problems -symptoms of vaginal infection like itching, irritation or unusual discharge -tenderness in the upper abdomen -vomiting -weakness or numbness in the arms or legs, especially on one side of the body -yellowing of the eyes or skin Side effects that usually do not require medical attention (report to your doctor or health care professional if they continue or are bothersome): -breakthrough bleeding and spotting that continues beyond the 3 initial cycles of pills -breast tenderness -mood changes, anxiety, depression, frustration, anger, or emotional outbursts -increased sensitivity to sun or ultraviolet light -nausea -skin rash, acne, or brown spots on the skin -weight gain (slight) This list may not describe all possible side effects. Call your doctor for medical advice about side effects. You may report side effects to FDA at 1-800-FDA-1088. Where should I keep my medicine? Keep out of the reach of children. Store at room temperature between 15 and 30 degrees C (59 and 86 degrees F) for up to 4 months. The product will expire after 4 months. Protect from light. Throw away any unused medicine after the expiration date. NOTE: This   sheet is a summary. It may not cover all possible information. If you have questions about this medicine, talk  to your doctor, pharmacist, or health care provider.  2018 Elsevier/Gold Standard (2016-08-06 17:00:31)  

## 2017-10-18 ENCOUNTER — Ambulatory Visit: Payer: Medicaid Other | Admitting: Medical

## 2018-03-09 ENCOUNTER — Ambulatory Visit (HOSPITAL_COMMUNITY)
Admission: EM | Admit: 2018-03-09 | Discharge: 2018-03-09 | Disposition: A | Discharge status: Home / Self Care | Payer: Medicaid Other | Attending: Internal Medicine | Admitting: Internal Medicine

## 2018-03-09 ENCOUNTER — Encounter (HOSPITAL_COMMUNITY): Payer: Self-pay | Admitting: Emergency Medicine

## 2018-03-09 DIAGNOSIS — R300 Dysuria: Secondary | ICD-10-CM | POA: Diagnosis not present

## 2018-03-09 DIAGNOSIS — R350 Frequency of micturition: Secondary | ICD-10-CM

## 2018-03-09 DIAGNOSIS — Z79899 Other long term (current) drug therapy: Secondary | ICD-10-CM | POA: Diagnosis not present

## 2018-03-09 DIAGNOSIS — S6992XA Unspecified injury of left wrist, hand and finger(s), initial encounter: Secondary | ICD-10-CM | POA: Diagnosis not present

## 2018-03-09 DIAGNOSIS — L608 Other nail disorders: Secondary | ICD-10-CM

## 2018-03-09 LAB — POCT URINALYSIS DIP (DEVICE)
Bilirubin Urine: NEGATIVE
Glucose, UA: NEGATIVE mg/dL
Ketones, ur: NEGATIVE mg/dL
Nitrite: NEGATIVE
Protein, ur: NEGATIVE mg/dL
Specific Gravity, Urine: >=1.03 (ref 1.005–1.030)
Urobilinogen, UA: 0.2 mg/dL (ref 0.0–1.0)
pH: 6 (ref 5.0–8.0)

## 2018-03-09 LAB — POCT PREGNANCY, URINE: Preg Test, Ur: NEGATIVE

## 2018-03-09 MED ORDER — NITROFURANTOIN MONOHYD MACRO 100 MG PO CAPS: 100.0000 mg | ORAL_CAPSULE | Freq: Two times a day (BID) | ORAL | 0 refills | Status: AC

## 2018-03-09 NOTE — Discharge Instructions (Signed)
Please begin macrobid for UTI  Please wrap finger to apply pressure to nail to see if it will grow back attached.

## 2018-03-09 NOTE — ED Provider Notes (Signed)
[facility]    CSN: [id number] Arrival date & time: 03/09/18  1547     History   Chief Complaint Chief Complaint  Patient presents with  . Dysuria  . Nail Problem    HPI Jennifer Lynn is a 22 y.o. female presenting today with concern for dysuria as well as requesting to have her fingernail removed.  States that for the past 5 days she has had dysuria or urgency.  Dates that last time she had a UTI it was really bad.  She is trying to catch it before it worsens.  Denies any vaginal discharge or vaginal irritation/itching.  Last menstrual cycle was 3/6.  Patient is not on any form of birth control.  Patient has tried over-the-counter Cystex.  Patient also requesting her fingernail to be removed, states that 2-3 weeks ago she was wearing acrylic nails and bent her finger all the way back and nail was loosened, since as it has been growing out it has not reattached.  It is located on her left ring finger.   HPI  Past Medical History:  Diagnosis Date  . Medical history non-contributory     Patient Active Problem List   Diagnosis Date Noted  . History of pre-eclampsia 10/17/2017  . Alpha thalassemia trait 03/17/2017  . Morbid obesity with BMI of 40.0-44.9, adult (HCC) 06/26/2015    Past Surgical History:  Procedure Laterality Date  . CESAREAN SECTION N/A 09/16/2017   Procedure: CESAREAN SECTION;  Surgeon: Mattoon Bing, MD;  Location: [facility];  Service: Obstetrics;  Laterality: N/A;  . NO PAST SURGERIES      OB History    Gravida  1   Para  1   Term  1   Preterm  0   AB  0   Living  1     SAB  0   TAB  0   Ectopic  0   Multiple  0   Live Births  1            Home Medications    Prior to Admission medications   Medication Sig Start Date End Date Taking? Authorizing Provider  Biotin 1000 MCG tablet Take 1,000 mcg by mouth 3 (three) times daily.    [provider]  docusate sodium (COLACE) 100 MG capsule Take 1  capsule (100 mg total) by mouth 2 (two) times daily as needed. 09/18/17   Anyanwu, Jethro Bastos, MD  ferrous sulfate 325 (65 FE) MG tablet Take 1 tablet (325 mg total) by mouth daily with breakfast. 07/05/17   Marny Lowenstein, PA-C  ferrous sulfate 325 (65 FE) MG tablet Take 1 tablet (325 mg total) by mouth 2 (two) times daily with a meal. 09/18/17   Anyanwu, Jethro Bastos, MD  ibuprofen (ADVIL,MOTRIN) 600 MG tablet Take 1 tablet (600 mg total) by mouth every 6 (six) hours as needed. 09/18/17   Anyanwu, Jethro Bastos, MD  nitrofurantoin, macrocrystal-monohydrate, (MACROBID) 100 MG capsule Take 1 capsule (100 mg total) by mouth 2 (two) times daily for 5 days. 03/09/18 03/14/18  Chee Kinslow C, PA-C  oxyCODONE-acetaminophen (PERCOCET/ROXICET) 5-325 MG tablet Take 1-2 tablets by mouth every 6 (six) hours as needed. 09/18/17   Anyanwu, Jethro Bastos, MD  Prenatal Vit-Fe Fumarate-FA (PRENATAL MULTIVITAMIN) TABS tablet Take 1 tablet by mouth daily at 12 noon.    [provider]    Family History Family History  Problem Relation Age of Onset  . Diabetes Maternal Grandmother   .  Diabetes Paternal Grandmother     Social History Social History   Tobacco Use  . Smoking status: Never Smoker  . Smokeless tobacco: Never Used  Substance Use Topics  . Alcohol use: No  . Drug use: No     Allergies   Patient has no known allergies.   Review of Systems Review of Systems  Constitutional: Negative for fever.  Respiratory: Negative for shortness of breath.   Cardiovascular: Negative for chest pain.  Gastrointestinal: Negative for abdominal pain, diarrhea, nausea and vomiting.  Genitourinary: Positive for dysuria. Negative for flank pain, genital sores, hematuria, menstrual problem, vaginal bleeding, vaginal discharge and vaginal pain.  Musculoskeletal: Negative for back pain.  Skin: Negative for rash.       Fingernail problem  Neurological: Negative for dizziness, light-headedness and headaches.      Physical Exam Triage Vital Signs ED Triage Vitals [03/09/18 1717]  Enc Vitals Group     BP (!) 146/80     Pulse Rate 77     Resp 18     Temp 98.4 F (36.9 C)     Temp src      SpO2 100 %     Weight      Height      Head Circumference      Peak Flow      Pain Score 0     Pain Loc      Pain Edu?      Excl. in GC?    No data found.  Updated Vital Signs BP (!) 146/80   Pulse 77   Temp 98.4 F (36.9 C)   Resp 18   LMP 02/15/2018   SpO2 100%   Visual Acuity Right Eye Distance:   Left Eye Distance:   Bilateral Distance:    Right Eye Near:   Left Eye Near:    Bilateral Near:     Physical Exam  Constitutional: She appears well-developed and well-nourished. No distress.  HENT:  Head: Normocephalic and atraumatic.  Eyes: Conjunctivae are normal.  Neck: Neck supple.  Cardiovascular: Normal rate and regular rhythm.  No murmur heard. Pulmonary/Chest: Effort normal and breath sounds normal. No respiratory distress.  Abdominal: Soft. There is no tenderness.  Musculoskeletal: She exhibits no edema.  Neurological: She is alert.  Skin: Skin is warm and dry.  Left ring finger with nail wirelessly are ready detached from nail bed  Psychiatric: She has a normal mood and affect.  Nursing note and vitals reviewed.    UC Treatments / Results  Labs (all labs ordered are listed, but only abnormal results are displayed) Labs Reviewed  POCT URINALYSIS DIP (DEVICE) - Abnormal; Notable for the following components:      Result Value   Hgb urine dipstick SMALL (*)    Leukocytes, UA SMALL (*)    All other components within normal limits  URINE CULTURE  POCT PREGNANCY, URINE    EKG None Radiology No results found.  Procedures Nail Removal Date/Time: 03/09/2018 9:22 PM Performed by: Thijs Brunton, Medina C, PA-C Authorized by: Ally Knodel, Junius Creamer, PA-C   Consent:    Consent obtained:  Verbal   Consent given by:  Patient   Risks discussed:  Incomplete removal, pain  and permanent nail deformity   Alternatives discussed:  No treatment Location:    Hand:  L ring finger Pre-procedure details:    Skin preparation:  Alcohol Anesthesia (see MAR for exact dosages):    Anesthesia method:  Nerve block   Block location:  Finger   Block needle gauge:  25 G   Block anesthetic:  Lidocaine 2% w/o epi   Block technique:  Direct injection   Block outcome:  Incomplete block Nail Removal:    Nail removed:  Complete   Nail bed repaired: no     Removed nail replaced and anchored: no   Post-procedure details:    Patient tolerance of procedure:  Tolerated well, no immediate complications   (including critical care time)  Medications Ordered in UC Medications - No data to display   Initial Impression / Assessment and Plan / UC Course  I have reviewed the triage vital signs and the nursing notes.  Pertinent labs & imaging results that were available during my care of the patient were reviewed by me and considered in my medical decision making (see chart for details).     Patient with small leuks on UA, will send for culture.  Will begin on Macrobid.  Patient without fever, nausea, vomiting, tachycardia, CVA tenderness.  Unlikely [place].  Fingernail removed, tolerated procedure well.  Discussed with patient that it is unsure finger nail will regrow attached or not. Discussed strict return precautions. Patient verbalized understanding and is agreeable with plan.   Final Clinical Impressions(s) / UC Diagnoses   Final diagnoses:  Dysuria    ED Discharge Orders        Ordered    nitrofurantoin, macrocrystal-monohydrate, (MACROBID) 100 MG capsule  2 times daily     03/09/18 1815       Controlled Substance Prescriptions [place] Controlled Substance Registry consulted? Not Applicable   Lew DawesWieters, Linder Prajapati C, [place]PA-C 03/09/18 2125

## 2018-03-09 NOTE — ED Triage Notes (Signed)
Pt c/o burning with urination, pt also requesting we remove her fingernail on L hand, states she hit it and wants the nail removed.

## 2018-03-12 LAB — URINE CULTURE: Culture: >=100000 — AB

## 2018-03-14 ENCOUNTER — Telehealth (HOSPITAL_COMMUNITY): Payer: Self-pay

## 2018-03-14 NOTE — Telephone Encounter (Signed)
Pt returned call to go over results from recent visit, verbalized understanding to complete antibiotic. Pt encouraged to be seen again if symptoms persist or worsen.

## 2018-03-14 NOTE — Telephone Encounter (Signed)
Attempted to reach patient regarding recent visit and test results, no answer at this time. voicemail not set up.

## 2018-08-18 ENCOUNTER — Encounter: Payer: Self-pay | Admitting: Family Medicine

## 2018-08-18 ENCOUNTER — Ambulatory Visit: Payer: Medicaid Other | Attending: Family Medicine | Admitting: Family Medicine

## 2018-08-18 ENCOUNTER — Other Ambulatory Visit: Payer: Self-pay

## 2018-08-18 VITALS — BP 134/87 | HR 74 | Temp 99.0°F | Resp 18 | Ht 65.0 in | Wt 232.4 lb

## 2018-08-18 DIAGNOSIS — R51 Headache: Secondary | ICD-10-CM

## 2018-08-18 DIAGNOSIS — D649 Anemia, unspecified: Secondary | ICD-10-CM | POA: Diagnosis not present

## 2018-08-18 DIAGNOSIS — K149 Disease of tongue, unspecified: Secondary | ICD-10-CM

## 2018-08-18 DIAGNOSIS — R413 Other amnesia: Secondary | ICD-10-CM

## 2018-08-18 DIAGNOSIS — G8929 Other chronic pain: Secondary | ICD-10-CM

## 2018-08-18 DIAGNOSIS — R519 Headache, unspecified: Secondary | ICD-10-CM

## 2018-08-18 NOTE — Progress Notes (Signed)
Subjective:    Patient ID: Jennifer Lynn, female    DOB: [birth year], 22 y.o.   MRN: [id number]  HPI       22 yo female who was last seen in the office on 02/04/17 who presents secondary to the complaint of having recurrent, daily, sharp shooting pain on the left side of her head that lasts for about 10 seconds as well as problems with memory and recall since she gave birth to her son.  Patient states that she now has to write things down so that she does not forget them.  Patient recalls that her pregnancy was normal up until the day of her pregnancy when she had the sudden onset of elevated blood pressure and a headache. Patient does not recall what else occurred that day.       Patient denies any changes in vision, no visual disturbance. No nausea or loss of balance associated with her headaches.  Patient has noticed some occasional constipation.  No abdominal pain.  Patient denies urinary frequency urgency or dysuria.  Patient states that she did not have any issues with gestational diabetes.  Patient has been anemic in the past but has not been taking an iron supplement recently.  Patient has also noticed since her son was born that she has recurrent issues with her tongue feeling irritated.  Patient states that now if she drinks juices or eat certain foods, she will feel as if her tongue is burning.  Patient denies any sensation of tongue swelling or throat swelling.  No shortness of breath.  Review of Systems  Constitutional: Positive for fatigue. Negative for chills and fever.  HENT: Positive for congestion. Negative for sore throat and trouble swallowing.        Patient with complaint of tongue feeling as if it is irritated  Respiratory: Negative for cough and shortness of breath.   Cardiovascular: Negative for chest pain, palpitations and leg swelling.  Gastrointestinal: Positive for constipation. Negative for abdominal pain.  Genitourinary: Negative for dysuria and frequency.    Musculoskeletal: Negative for arthralgias and joint swelling.  Neurological: Positive for headaches. Negative for dizziness.  Psychiatric/Behavioral: Positive for decreased concentration. The patient is not nervous/anxious.        Objective:   Physical Exam  Constitutional: She is oriented to person, place, and time. She appears well-nourished. No distress.  Obese young adult female  HENT:  Head: Normocephalic and atraumatic.  Right Ear: Tympanic membrane, external ear and ear canal normal.  Left Ear: Tympanic membrane, external ear and ear canal normal.  Nose: Mucosal edema and rhinorrhea present.  Mouth/Throat: Posterior oropharyngeal edema and posterior oropharyngeal erythema present.  Eyes: Pupils are equal, round, and reactive to light. Conjunctivae and EOM are normal.  Neck: Normal range of motion. Neck supple. No thyromegaly present.  Some fullness of the thyroid area but no discrete thyroid enlargement  Cardiovascular: Normal rate and regular rhythm.  Pulmonary/Chest: Effort normal and breath sounds normal.  Abdominal: Soft. There is no tenderness.  Musculoskeletal: She exhibits no edema or tenderness.  Lymphadenopathy:    She has no cervical adenopathy.  Neurological: She is alert and oriented to person, place, and time. No cranial nerve deficit.  Psychiatric: She has a normal mood and affect.    BP 134/87 (BP Location: Right Arm, Patient Position: Sitting, Cuff Size: Normal)   Pulse 74   Temp 99 F (37.2 C) (Oral)   Resp 18   Ht 5\' 5"  (1.651 m)  Wt 232 lb 6.4 oz (105.4 kg)   SpO2 99%   BMI 38.67 kg/m  vital signs reviewed      Assessment & Plan:  1. Anemia, unspecified type Per lab results, patient with hemoglobin of 7.6 on 09/17/2017.  Patient will have a repeat of her hemoglobin at today's visit as part of her CBC.  Patient will be notified if she needs to restart use of her iron supplement - CBC with Differential  2. Irritation of tongue Patient with  complaint of tongue irritation.  Will check vitamin B12 and BMP in follow-up of glossitis - Vitamin B12 - Basic Metabolic Panel  3. Chronic nonintractable headache, unspecified headache type Patient with complaint of chronic daily headache/cephalgia as well as memory impairment since hypertensive urgency/preeclampsia during birth of her child.  Patient will have head CT and BMP in follow-up of her headache and memory impairment - CT Head Wo Contrast; Future - Basic Metabolic Panel  4. Memory impairment Patient with complaint of memory impairment since giving birth to her son.  Will check CT to see if there are any signs of prior ischemia/stroke as well as TSH to look for hypothyroidism as a contributor to memory impairment - CT Head Wo Contrast; Future - TSH - Basic Metabolic Panel  *Patient was offered but declined influenza immunization at today's visit An After Visit Summary was printed and given to the patient.  Return in about 3 weeks (around 09/08/2018).

## 2018-08-18 NOTE — Progress Notes (Signed)
Patient concerns with tongue it tends to itch, burn and have a tingling sensation. It has been going on for about a year now. Pain  on left side of head and cannot think for about 10 seconds, been happening since after the birth of her daughter .

## 2018-08-19 LAB — CBC WITH DIFFERENTIAL/PLATELET
Basophils Absolute: 0 x10E3/uL (ref 0.0–0.2)
Basos: 0 %
EOS (ABSOLUTE): 0.1 x10E3/uL (ref 0.0–0.4)
Eos: 2 %
Hematocrit: 36.5 % (ref 34.0–46.6)
Hemoglobin: 11.3 g/dL (ref 11.1–15.9)
Immature Grans (Abs): 0 x10E3/uL (ref 0.0–0.1)
Immature Granulocytes: 0 %
Lymphocytes Absolute: 2.1 x10E3/uL (ref 0.7–3.1)
Lymphs: 35 %
MCH: 24.2 pg — ABNORMAL LOW (ref 26.6–33.0)
MCHC: 31 g/dL — ABNORMAL LOW (ref 31.5–35.7)
MCV: 78 fL — ABNORMAL LOW (ref 79–97)
Monocytes Absolute: 0.6 x10E3/uL (ref 0.1–0.9)
Monocytes: 10 %
Neutrophils Absolute: 3.1 x10E3/uL (ref 1.4–7.0)
Neutrophils: 53 %
Platelets: 326 x10E3/uL (ref 150–450)
RBC: 4.66 x10E6/uL (ref 3.77–5.28)
RDW: 14.2 % (ref 12.3–15.4)
WBC: 5.9 x10E3/uL (ref 3.4–10.8)

## 2018-08-19 LAB — BASIC METABOLIC PANEL WITH GFR
BUN/Creatinine Ratio: 11 (ref 9–23)
BUN: 9 mg/dL (ref 6–20)
CO2: 26 mmol/L (ref 20–29)
Calcium: 9.5 mg/dL (ref 8.7–10.2)
Chloride: 103 mmol/L (ref 96–106)
Creatinine, Ser: 0.81 mg/dL (ref 0.57–1.00)
GFR calc Af Amer: 118 mL/min/1.73
GFR calc non Af Amer: 103 mL/min/1.73
Glucose: 76 mg/dL (ref 65–99)
Potassium: 4.2 mmol/L (ref 3.5–5.2)
Sodium: 142 mmol/L (ref 134–144)

## 2018-08-19 LAB — TSH: TSH: 1.46 u[IU]/mL (ref 0.450–4.500)

## 2018-08-19 LAB — VITAMIN B12: Vitamin B-12: 291 pg/mL (ref 232–1245)

## 2018-08-23 ENCOUNTER — Telehealth: Payer: Self-pay

## 2018-08-23 NOTE — Telephone Encounter (Signed)
-----   Message from Cain Saupe, MD sent at 08/19/2018  8:33 PM EDT ----- Please notify patient that her TSH, thyroid test, was normal.  Please let patient know that her vitamin B12 level was within normal at 291 but normal is [phone] therefore would suggest that she take an over-the-counter vitamin B12 supplement.  Patient's anemia is improved and hemoglobin is now at 11.3 which is considered within normal with normal being 11.1-15.9 per this lab range

## 2018-08-23 NOTE — Telephone Encounter (Signed)
Patient called, verified dob and was given lab results. Patient verbalized understanding and had no further questions.

## 2018-08-23 NOTE — Telephone Encounter (Signed)
Patient was called, no answer, lvm to return call. Please notify patient that her TSH, thyroid test, was normal.  Please let patient know that her vitamin B12 level was within normal at 291 but normal is [phone] therefore would suggest that she take an over-the-counter vitamin B12 supplement.  Patient's anemia is improved and hemoglobin is now at 11.3 which is considered within normal with normal being 11.1-15.9 per this lab range

## 2018-08-28 ENCOUNTER — Telehealth: Payer: Self-pay | Admitting: Family Medicine

## 2018-08-28 NOTE — Telephone Encounter (Signed)
Melanie from Pre-service called to get pre-certification for Jennifer Lynn. Please follow up with melanie.

## 2018-09-01 ENCOUNTER — Ambulatory Visit (HOSPITAL_COMMUNITY): Payer: Medicaid Other

## 2018-09-06 ENCOUNTER — Telehealth: Payer: Self-pay

## 2018-09-06 NOTE — Telephone Encounter (Signed)
Went on the [employer] to start prior auth for ct head w/o contrast on 09/01/18. Had to answer clinical questions. Received a message that [employer] is needing additional information.    CPT- [id number] CT head or brain w/o contrast ICD 10-  R41.3 other amnesia                R51 headaches      Case # [id number]   Faxed clinical information on 09/06/2018

## 2018-09-08 ENCOUNTER — Ambulatory Visit: Payer: Medicaid Other | Admitting: Family Medicine

## 2018-09-08 ENCOUNTER — Telehealth: Payer: Self-pay | Admitting: *Deleted

## 2018-09-08 NOTE — Telephone Encounter (Signed)
Faxed clinical information: OV note  and Lab results pertaining to DOS 08/18/2018 to [phone] and [phone].   Case Number: [id number] Canceled appointment for CT Scan on Monday until notification on approval status.

## 2018-09-11 ENCOUNTER — Ambulatory Visit (HOSPITAL_COMMUNITY): Payer: Medicaid Other

## 2018-09-26 ENCOUNTER — Ambulatory Visit: Payer: Medicaid Other | Admitting: Family Medicine

## 2018-09-28 ENCOUNTER — Telehealth: Payer: Self-pay

## 2018-09-28 NOTE — Telephone Encounter (Signed)
-----   Message from Cain Saupe, MD sent at 09/28/2018 12:19 PM EDT ----- Regarding: RE: need preauth for ct Is there a number that I as the provider need to call regarding the pre-authorization since it does not appear that her company responded to the clinical information that you submitted? Patient was scheduled for an office visit in follow-up of her headaches but she did not show up for her appointment. Also, please provide Darius Bump with the information that she will need to contact the insurance company going forward ----- Message ----- From: Jadene Pierini, April H Sent: 08/31/2018   2:39 PM EDT To: Cain Saupe, MD Subject: need preauth for ct                            Need a preauth for her ct scan to be done please.

## 2018-09-28 NOTE — Telephone Encounter (Signed)
Patient was called, verified DOB, and was informed of upcoming ct scan appointment, 10/05/18 @ 3:15/3:30 at [facility] in the radiology dept. Patient verbalized understanding and had no further questions.

## 2018-09-29 NOTE — Telephone Encounter (Signed)
[phone] [employer] Number to get the extension  Shawna Orleans called because patient needs a  pre-auth to be extended because she is set to be there at 10/24 and it expires on the 10/20. Please follow up

## 2018-10-02 NOTE — Telephone Encounter (Signed)
Someone with pre Jennifer Lynn called regarding patient's CT scan. Please follow up.

## 2018-10-04 NOTE — Telephone Encounter (Signed)
Called to extend prior auth for ct scan, was advised I would have to start a new case due to other prior auth expiring 10/01/18. New prior authorization completed 10/02/18.   Case #:[id number] Authorization code: [id number]  Patients appointment is 10/05/18, at [facility], with arrival time 3:15pm.

## 2018-10-05 ENCOUNTER — Ambulatory Visit (HOSPITAL_COMMUNITY)
Admission: RE | Admit: 2018-10-05 | Discharge: 2018-10-05 | Disposition: A | Discharge status: Home / Self Care | Payer: Medicaid Other | Source: Ambulatory Visit | Attending: Family Medicine | Admitting: Family Medicine

## 2018-10-05 DIAGNOSIS — R413 Other amnesia: Secondary | ICD-10-CM | POA: Diagnosis not present

## 2018-10-05 DIAGNOSIS — R51 Headache: Secondary | ICD-10-CM | POA: Insufficient documentation

## 2018-10-05 DIAGNOSIS — R519 Headache, unspecified: Secondary | ICD-10-CM

## 2018-10-06 ENCOUNTER — Telehealth: Payer: Self-pay

## 2018-10-06 NOTE — Telephone Encounter (Signed)
Patient was called regarding results, patient answered, verified dob, and was given ct of head test results. Patient verbalized understanding and had no further questions.

## 2018-10-06 NOTE — Telephone Encounter (Signed)
-----   Message from Cain Saupe, MD sent at 10/06/2018 12:54 PM EDT ----- Please notify patient that her head CT was negative

## 2018-10-12 ENCOUNTER — Ambulatory Visit: Payer: Medicaid Other | Attending: Family Medicine | Admitting: Physician Assistant

## 2018-10-12 VITALS — BP 121/85 | HR 72 | Temp 98.2°F | Resp 16 | Ht 65.0 in | Wt 236.2 lb

## 2018-10-12 DIAGNOSIS — S39012A Strain of muscle, fascia and tendon of lower back, initial encounter: Secondary | ICD-10-CM | POA: Diagnosis not present

## 2018-10-12 DIAGNOSIS — Z30011 Encounter for initial prescription of contraceptive pills: Secondary | ICD-10-CM | POA: Insufficient documentation

## 2018-10-12 DIAGNOSIS — X58XXXA Exposure to other specified factors, initial encounter: Secondary | ICD-10-CM | POA: Diagnosis not present

## 2018-10-12 MED ORDER — NORGESTIMATE-ETH ESTRADIOL 0.25-35 MG-MCG PO TABS: 1.0000 | ORAL_TABLET | Freq: Every day | ORAL | 11 refills | Status: DC
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
## 2018-10-16 ENCOUNTER — Ambulatory Visit: Payer: Medicaid Other | Admitting: Family Medicine

## 2018-10-16 ENCOUNTER — Encounter

## 2018-10-16 ENCOUNTER — Telehealth: Payer: Self-pay | Admitting: Family Medicine

## 2018-10-16 NOTE — Telephone Encounter (Signed)
Patient believes the no show for 10/15 should not be accounted for due to them being contacted to not go to the appt due to no authorization for CT scan.

## 2018-11-13 IMAGING — CT CT HEAD W/O CM
4 series · 17 of 47 positions shown, 19 images · non-contrast
Comparison: None.

CLINICAL DATA: Headache.

EXAM:
CT HEAD WITHOUT CONTRAST
TECHNIQUE: Contiguous axial images were obtained from the base of the skull
through the vertex without intravenous contrast.

[Series 3: head without · axial · non-contrast · 0.45mm/px · z∈[-134,-14]mm · 7 of 33 slices shown, 9 images]
[im 5/33  brain]
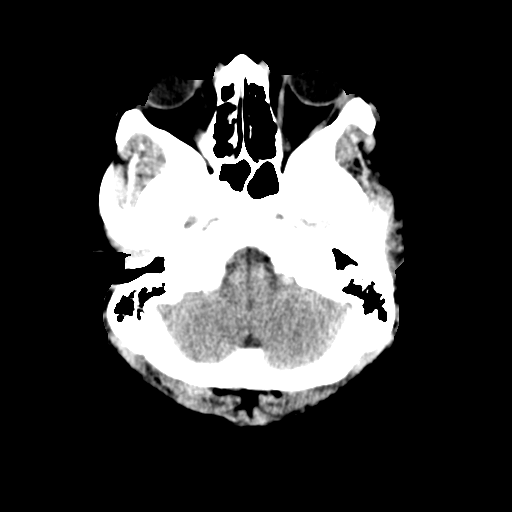
[im 5/33  bone]
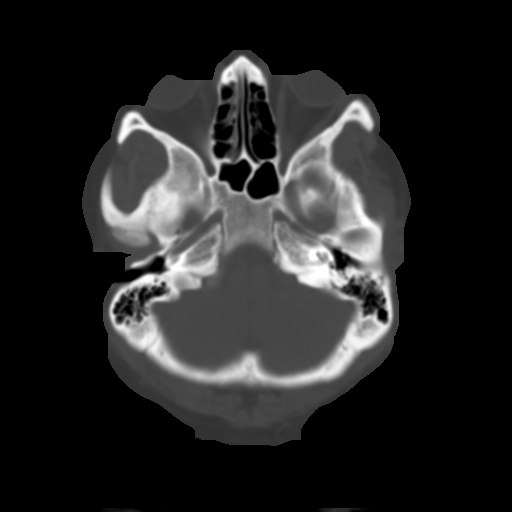
[im 9/33  brain]
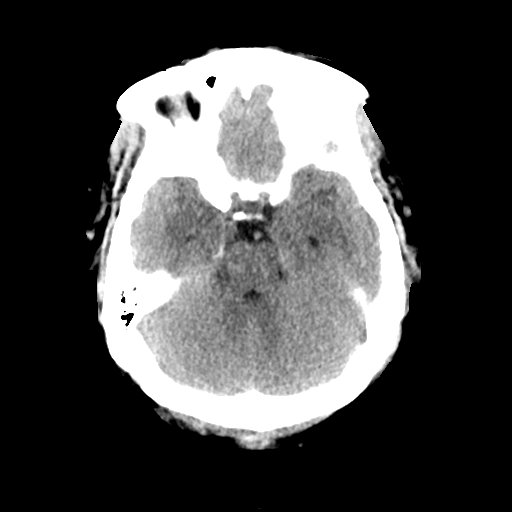
[im 13/33  brain]
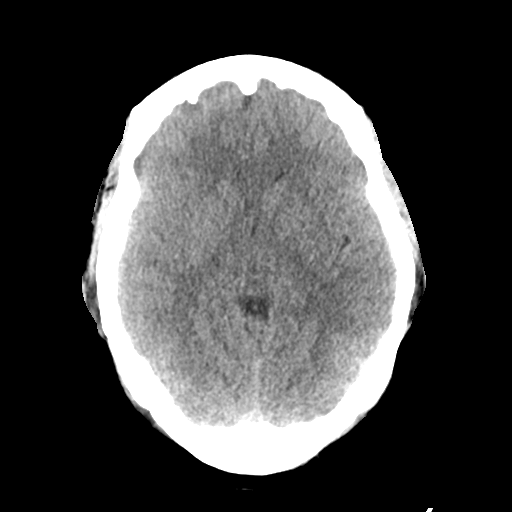
[im 17/33  brain]
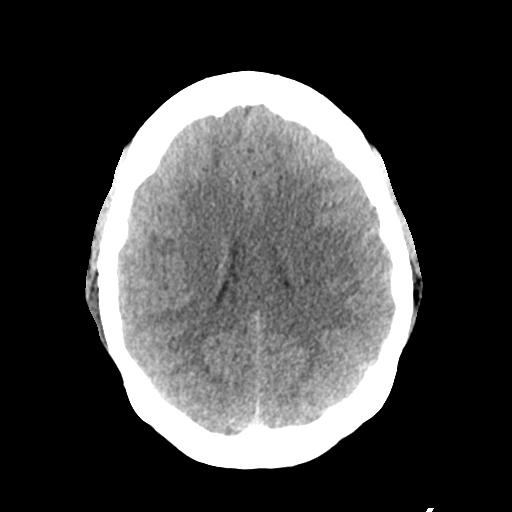
[im 21/33  brain]
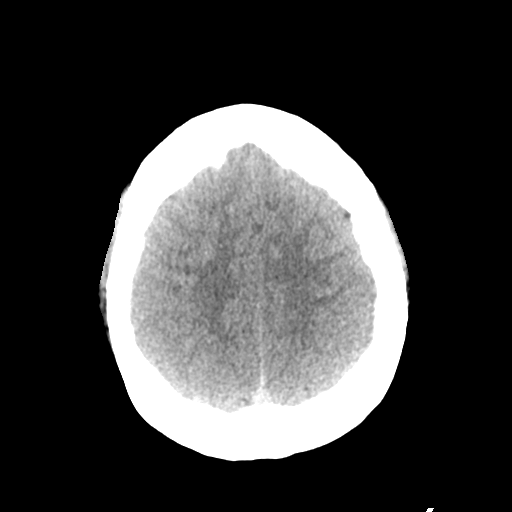
[im 21/33  bone]
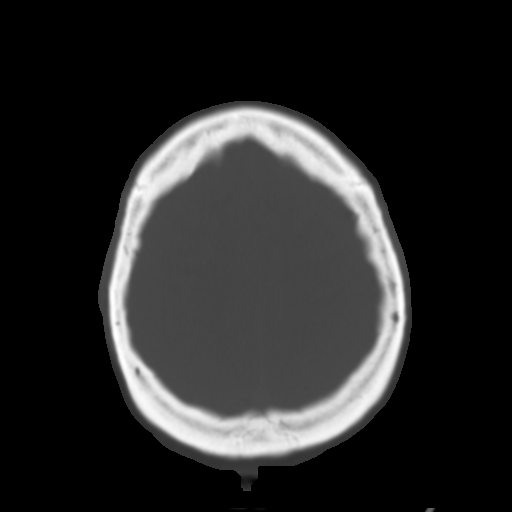
[im 25/33  brain]
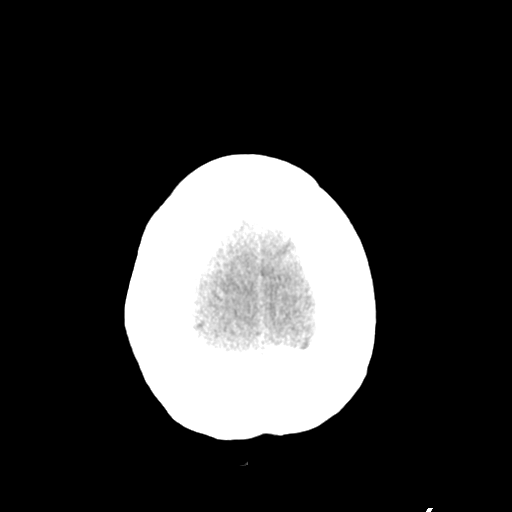
[im 29/33  brain]
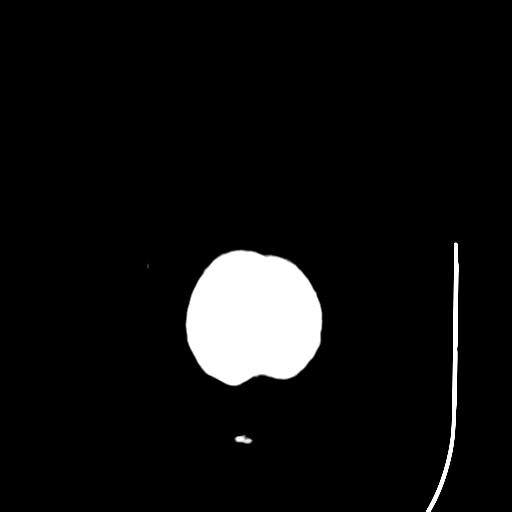

[Series 4: head bone · axial · 0.45mm/px · z∈[-138,-82]mm · 4 of 82 slices shown]
[im 9/82  bone]
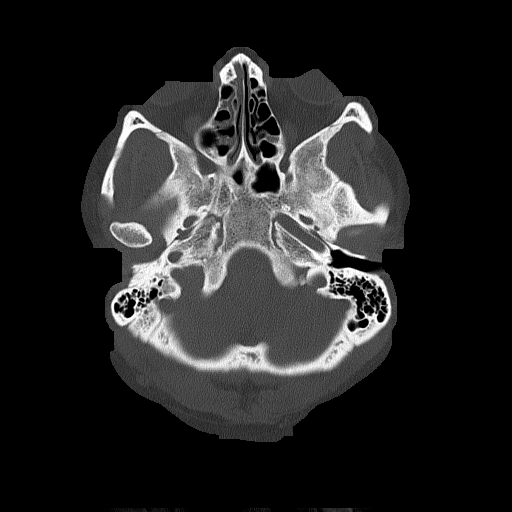
[im 17/82  bone]
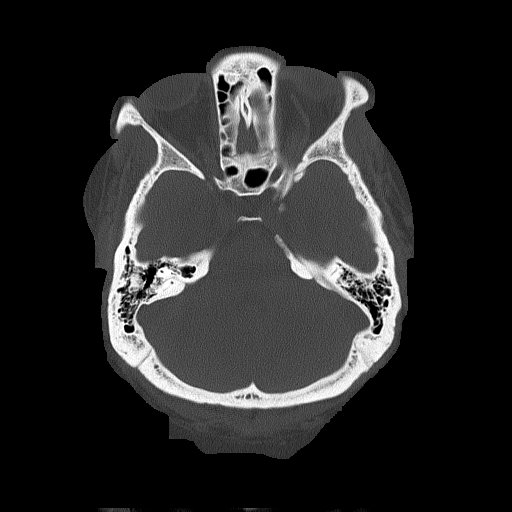
[im 25/82  bone]
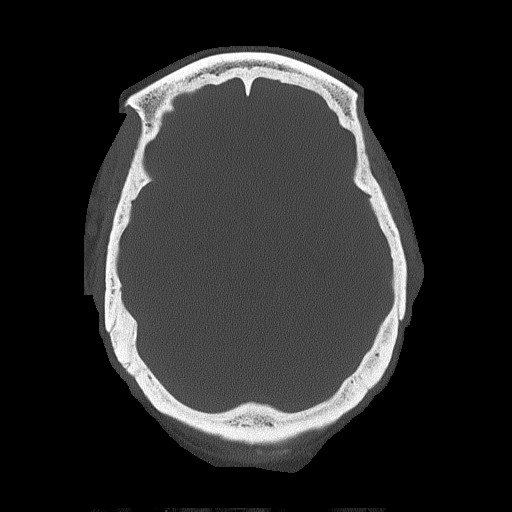
[im 37/82  bone]
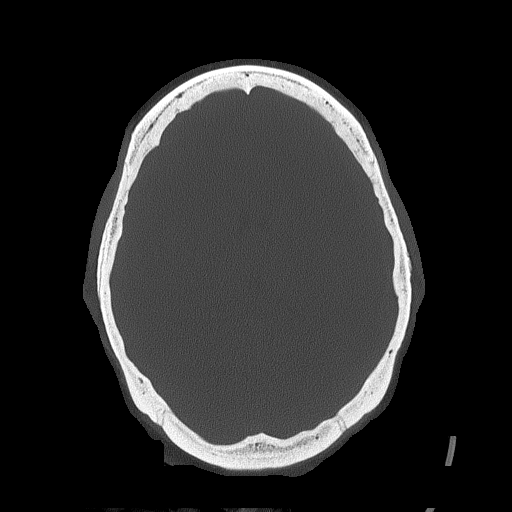

[Series 5: head without cor · coronal · non-contrast · 0.32mm/px · 3 of 70 slices shown]
[im 24/70  brain]
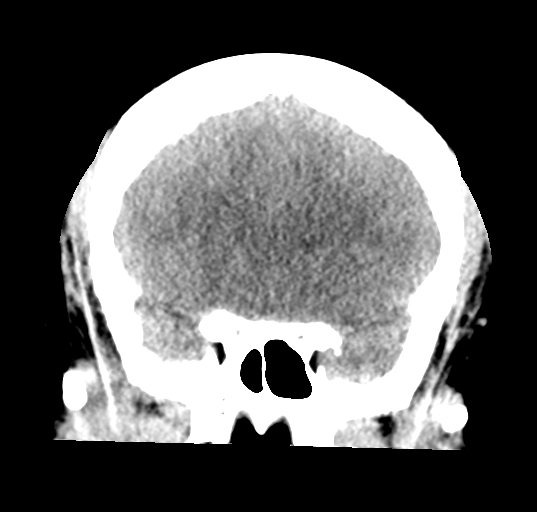
[im 31/70  brain]
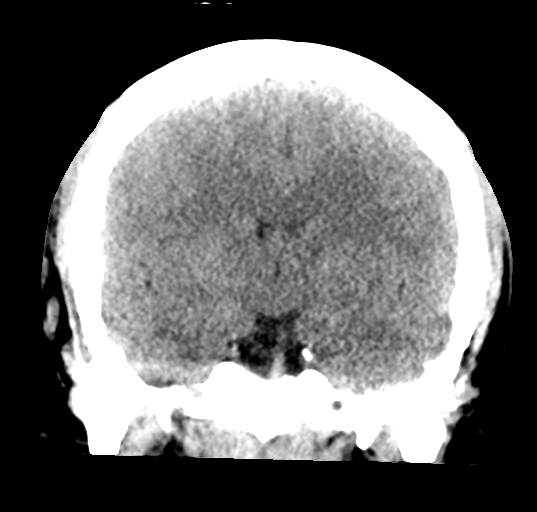
[im 39/70  brain]
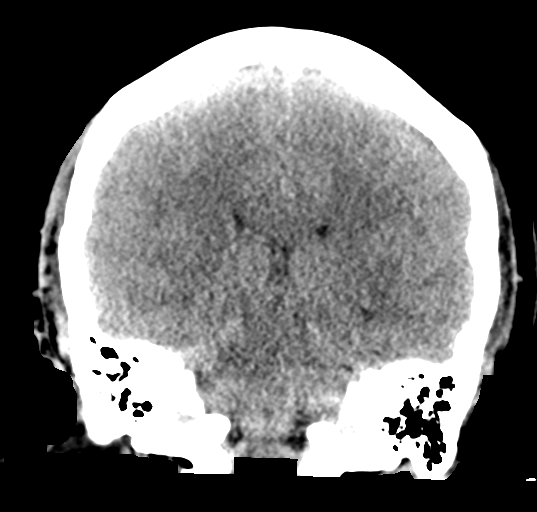

[Series 6: head without sag · sagittal · non-contrast · 0.32mm/px · 3 of 58 slices shown]
[im 20/58  brain]
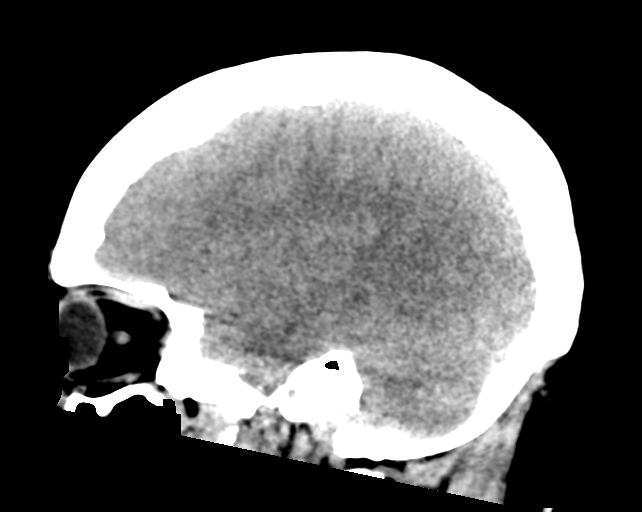
[im 29/58  brain]
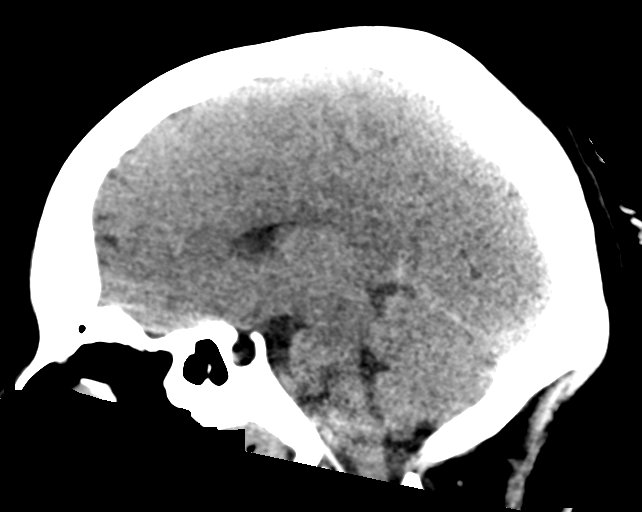
[im 39/58  brain]
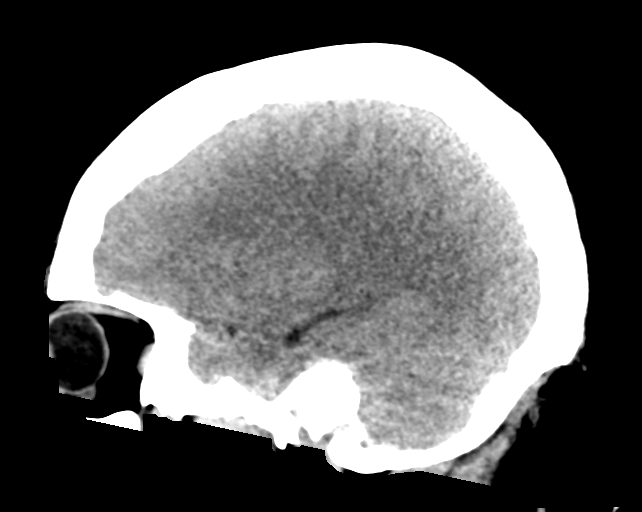

[17 of 47 positions shown; findings below may reference images not displayed]

FINDINGS: Brain: No evidence of acute infarction, hemorrhage, hydrocephalus,
extra-axial collection or mass lesion/mass effect.

Vascular: No hyperdense vessel or unexpected calcification.

Skull: Normal. Negative for fracture or focal lesion.

Sinuses/Orbits: No acute finding.

Other: None.
IMPRESSION: Normal head CT.

## 2019-02-08 ENCOUNTER — Ambulatory Visit: Payer: Medicaid Other | Attending: Family Medicine | Admitting: Family Medicine

## 2019-02-08 ENCOUNTER — Encounter: Payer: Self-pay | Admitting: Family Medicine

## 2019-02-08 VITALS — BP 118/83 | HR 90 | Temp 98.9°F | Ht 64.0 in | Wt 232.0 lb

## 2019-02-09 ENCOUNTER — Ambulatory Visit: Payer: Medicaid Other | Admitting: Family Medicine

## 2019-05-13 NOTE — Progress Notes (Signed)
Patient ID: Jennifer Lynn, female   DOB: 12/31/1995, 23 y.o.   MRN: [id number]   Patient left without being seen

## 2019-06-03 ENCOUNTER — Other Ambulatory Visit: Payer: Self-pay

## 2019-06-03 ENCOUNTER — Ambulatory Visit (HOSPITAL_COMMUNITY)
Admission: EM | Admit: 2019-06-03 | Discharge: 2019-06-03 | Disposition: A | Discharge status: Home / Self Care | Payer: Medicaid Other | Attending: Family Medicine | Admitting: Family Medicine

## 2019-06-03 ENCOUNTER — Encounter (HOSPITAL_COMMUNITY): Payer: Self-pay

## 2019-06-03 DIAGNOSIS — Z3202 Encounter for pregnancy test, result negative: Secondary | ICD-10-CM | POA: Diagnosis not present

## 2019-06-03 DIAGNOSIS — N3001 Acute cystitis with hematuria: Secondary | ICD-10-CM | POA: Insufficient documentation

## 2019-06-03 LAB — POCT URINALYSIS DIP (DEVICE)
Bilirubin Urine: NEGATIVE
Glucose, UA: NEGATIVE mg/dL
Ketones, ur: NEGATIVE mg/dL
Nitrite: NEGATIVE
Protein, ur: NEGATIVE mg/dL
Specific Gravity, Urine: >=1.03 (ref 1.005–1.030)
Urobilinogen, UA: 0.2 mg/dL (ref 0.0–1.0)
pH: 5.5 (ref 5.0–8.0)

## 2019-06-03 LAB — POCT PREGNANCY, URINE: Preg Test, Ur: NEGATIVE

## 2019-06-03 MED ORDER — CEPHALEXIN 500 MG PO CAPS: 500.0000 mg | ORAL_CAPSULE | Freq: Two times a day (BID) | ORAL | 0 refills | Status: AC

## 2019-06-03 NOTE — ED Provider Notes (Signed)
[facility]    CSN: [id number] Arrival date & time: 06/03/19  1600     History   Chief Complaint Chief Complaint  Patient presents with  . Urinary Tract Infection    HPI Jennifer Lynn is a 23 y.o. female with history of urinary tract infection presenting for concern of UTI.  Patient states that she has had urinary frequency, suprapubic pressure, burning with urination for the last week.  Patient states that she has noticed increased UTI when having unprotected intercourse, specifically when she is unable to void.  Patient denies flank pain, hematuria, malodorous urine.  Patient currently sexually active 1 month with one female partner, though there was other exposures.  Patient denies vaginal discharge, pelvic or abdominal pain, anogenital lesions, malodor.  Patient interested in STD check "for my own sake ".  Denies history of STD.  LMP 6/11.   Past Medical History:  Diagnosis Date  . Medical history non-contributory     Patient Active Problem List   Diagnosis Date Noted  . History of pre-eclampsia 10/17/2017  . Alpha thalassemia trait 03/17/2017  . Morbid obesity with BMI of 40.0-44.9, adult (HCC) 06/26/2015    Past Surgical History:  Procedure Laterality Date  . CESAREAN SECTION N/A 09/16/2017   Procedure: CESAREAN SECTION;  Surgeon: Maiden Rock BingPickens, Charlie, MD;  [facility];  Service: Obstetrics;  Laterality: N/A;  . NO PAST SURGERIES      OB History    Gravida  1   Para  1   Term  1   Preterm  0   AB  0   Living  1     SAB  0   TAB  0   Ectopic  0   Multiple  0   Live Births  1            Home Medications    Prior to Admission medications   Medication Sig Start Date End Date Taking? Authorizing Provider  Biotin 1000 MCG tablet Take 1,000 mcg by mouth 3 (three) times daily.    [provider]  cephALEXin (KEFLEX) 500 MG capsule Take 1 capsule (500 mg total) by mouth 2 (two) times daily for 3 days. 06/03/19  06/06/19  Hall-Potvin, [place], PA-C  docusate sodium (COLACE) 100 MG capsule Take 1 capsule (100 mg total) by mouth 2 (two) times daily as needed. Patient not taking: Reported on 08/18/2018 09/18/17   Tereso NewcomerAnyanwu, Ugonna A, MD  ferrous sulfate 325 (65 FE) MG tablet Take 1 tablet (325 mg total) by mouth daily with breakfast. Patient not taking: Reported on 08/18/2018 07/05/17   Marny LowensteinWenzel, Julie N, PA-C  ferrous sulfate 325 (65 FE) MG tablet Take 1 tablet (325 mg total) by mouth 2 (two) times daily with a meal. Patient not taking: Reported on 08/18/2018 09/18/17   Anyanwu, Jethro BastosUgonna A, MD  ibuprofen (ADVIL,MOTRIN) 600 MG tablet Take 1 tablet (600 mg total) by mouth every 6 (six) hours as needed. Patient not taking: Reported on 08/18/2018 09/18/17   Tereso NewcomerAnyanwu, Ugonna A, MD  norgestimate-ethinyl estradiol (ORTHO-CYCLEN,SPRINTEC,PREVIFEM) 0.25-35 MG-MCG tablet Take 1 tablet by mouth daily. Patient not taking: Reported on 02/08/2019 10/12/18   Anders SimmondsMcClung, Angela M, PA-C  oxyCODONE-acetaminophen (PERCOCET/ROXICET) 5-325 MG tablet Take 1-2 tablets by mouth every 6 (six) hours as needed. Patient not taking: Reported on 08/18/2018 09/18/17   Tereso NewcomerAnyanwu, Ugonna A, MD  Prenatal Vit-Fe Fumarate-FA (PRENATAL MULTIVITAMIN) TABS tablet Take 1 tablet by mouth daily at 12 noon.    [provider]    Family History Family History  Problem Relation Age of Onset  . Diabetes Maternal Grandmother   . Diabetes Paternal Grandmother     Social History Social History   Tobacco Use  . Smoking status: Never Smoker  . Smokeless tobacco: Never Used  Substance Use Topics  . Alcohol use: No    Comment: occasionally   . Drug use: No     Allergies   Patient has no known allergies.   Review of Systems As per HPI   Physical Exam Triage Vital Signs ED Triage Vitals  Enc Vitals Group     BP      Pulse      Resp      Temp      Temp src      SpO2      Weight      Height      Head Circumference      Peak Flow      Pain  Score      Pain Loc      Pain Edu?      Excl. in [place]?    No data found.  Updated Vital Signs BP 120/79 (BP Location: Right Arm)   Pulse 74   Temp 98.8 F (37.1 C) (Oral)   Resp 16   LMP 05/28/2019   SpO2 98%   Visual Acuity Right Eye Distance:   Left Eye Distance:   Bilateral Distance:    Right Eye Near:   Left Eye Near:    Bilateral Near:     Physical Exam Constitutional:      General: She is not in acute distress. HENT:     Head: Normocephalic and atraumatic.     Mouth/Throat:     Mouth: Mucous membranes are moist.  Eyes:     General: No scleral icterus.    Pupils: Pupils are equal, round, and reactive to light.  Cardiovascular:     Rate and Rhythm: Normal rate.  Pulmonary:     Effort: Pulmonary effort is normal.  Abdominal:     General: Bowel sounds are normal. There is no distension.     Palpations: Abdomen is soft.     Tenderness: There is no abdominal tenderness. There is no right CVA tenderness, left CVA tenderness or guarding.  Genitourinary:    Comments: Patient declined, self-swab performed Skin:    General: Skin is warm.     Coloration: Skin is not jaundiced or pale.  Neurological:     Mental Status: She is alert and oriented to person, place, and time.  Psychiatric:        Mood and Affect: Mood normal.        Thought Content: Thought content normal.      UC Treatments / Results  Labs (all labs ordered are listed, but only abnormal results are displayed) Labs Reviewed  POCT URINALYSIS DIP (DEVICE) - Abnormal; Notable for the following components:      Result Value   Hgb urine dipstick TRACE (*)    Leukocytes,Ua SMALL (*)    All other components within normal limits  URINE CULTURE  POC URINE PREG, ED  POCT PREGNANCY, URINE  CERVICOVAGINAL ANCILLARY ONLY    EKG None  Radiology No results found.  Procedures Procedures (including critical care time)  Medications Ordered in UC Medications - No data to display  Initial Impression  / Assessment and Plan / UC Course  I have reviewed the triage vital signs and the  nursing notes.  Pertinent labs & imaging results that were available during my care of the patient were reviewed by me and considered in my medical decision making (see chart for details).     23-year-old female with history of UTI, presenting for similar symptoms.  POCT urinalysis significant for trace hemoglobin and small amount of leukocytes.  Will send culture, treat with Keflex.  Urine pregnancy test negative.  Patient informed of results, therapy, return precautions.  Patient verbalized understanding. Final Clinical Impressions(s) / UC Diagnoses   Final diagnoses:  Acute cystitis with hematuria     Discharge Instructions     Take antibiotic as prescribed. Try voiding after intercourse.      ED Prescriptions    Medication Sig Dispense Auth. Provider   cephALEXin (KEFLEX) 500 MG capsule Take 1 capsule (500 mg total) by mouth 2 (two) times daily for 3 days. 6 capsule Hall-Potvin, [place], PA-C     Controlled Substance Prescriptions [place] Controlled Substance Registry consulted? Not Applicable   Shea EvansHall-Potvin, Brittany, [place]-C 06/03/19 1845

## 2019-06-03 NOTE — ED Triage Notes (Signed)
Pt C/O urinating frequently with some discomfort.  Symptoms started 3 days ago. Pt tried Azo OTC medication with no relief.

## 2019-06-03 NOTE — Discharge Instructions (Addendum)
Take antibiotic as prescribed. Try voiding after intercourse.

## 2019-06-04 LAB — CERVICOVAGINAL ANCILLARY ONLY
Chlamydia: NEGATIVE
Neisseria Gonorrhea: NEGATIVE
Trichomonas: NEGATIVE

## 2019-06-05 LAB — URINE CULTURE: Culture: 70000 — AB

## 2019-06-07 ENCOUNTER — Telehealth (HOSPITAL_COMMUNITY): Payer: Self-pay | Admitting: Emergency Medicine

## 2019-06-07 MED ORDER — CEPHALEXIN 500 MG PO CAPS: 500.0000 mg | ORAL_CAPSULE | Freq: Two times a day (BID) | ORAL | 0 refills | Status: DC

## 2019-06-07 NOTE — Telephone Encounter (Signed)
Pt called asking for two more days of medicine. Per [place] PA, okay to send. Left voicemail for patient to return call.

## 2019-09-12 DIAGNOSIS — Z20828 Contact with and (suspected) exposure to other viral communicable diseases: Secondary | ICD-10-CM | POA: Diagnosis not present

## 2020-01-30 ENCOUNTER — Other Ambulatory Visit: Payer: Self-pay

## 2020-01-30 ENCOUNTER — Encounter (HOSPITAL_COMMUNITY): Payer: Self-pay | Admitting: Urgent Care

## 2020-01-30 ENCOUNTER — Ambulatory Visit (HOSPITAL_COMMUNITY)
Admission: EM | Admit: 2020-01-30 | Discharge: 2020-01-30 | Disposition: A | Discharge status: Home / Self Care | Payer: Medicaid Other | Attending: Urgent Care | Admitting: Urgent Care

## 2020-01-30 DIAGNOSIS — M545 Low back pain, unspecified: Secondary | ICD-10-CM

## 2020-01-30 DIAGNOSIS — R1030 Lower abdominal pain, unspecified: Secondary | ICD-10-CM

## 2020-01-30 DIAGNOSIS — R300 Dysuria: Secondary | ICD-10-CM | POA: Insufficient documentation

## 2020-01-30 DIAGNOSIS — N3001 Acute cystitis with hematuria: Secondary | ICD-10-CM | POA: Diagnosis not present

## 2020-01-30 DIAGNOSIS — Z3202 Encounter for pregnancy test, result negative: Secondary | ICD-10-CM | POA: Diagnosis not present

## 2020-01-30 LAB — POC URINE PREG, ED
Preg Test, Ur: NEGATIVE
Preg Test, Ur: NEGATIVE

## 2020-01-30 LAB — POCT URINALYSIS DIP (DEVICE)
Bilirubin Urine: NEGATIVE
Glucose, UA: NEGATIVE mg/dL
Ketones, ur: NEGATIVE mg/dL
Nitrite: NEGATIVE
Protein, ur: NEGATIVE mg/dL
Specific Gravity, Urine: 1.02 (ref 1.005–1.030)
Urobilinogen, UA: 0.2 mg/dL (ref 0.0–1.0)
pH: 5.5 (ref 5.0–8.0)

## 2020-01-30 LAB — POCT PREGNANCY, URINE: Preg Test, Ur: NEGATIVE

## 2020-01-30 MED ORDER — TIZANIDINE HCL 4 MG PO TABS: 4.0000 mg | ORAL_TABLET | Freq: Four times a day (QID) | ORAL | 0 refills | Status: DC | PRN

## 2020-01-30 MED ORDER — CEPHALEXIN 500 MG PO CAPS: 500.0000 mg | ORAL_CAPSULE | Freq: Two times a day (BID) | ORAL | 0 refills | Status: DC

## 2020-01-30 MED ORDER — NAPROXEN 500 MG PO TABS: 500.0000 mg | ORAL_TABLET | Freq: Two times a day (BID) | ORAL | 0 refills | Status: DC

## 2020-01-30 NOTE — ED Provider Notes (Signed)
[facility]   MRN: [id number] DOB: 1996/01/24  Subjective:   Jennifer Lynn is a 24 y.o. female presenting for 2 to 3-day history of dysuria, low back pain and lower abdominal pain.  She also noticed some blood in her urine this morning.  Last UTI was 05/2019, had non-resistant E. coli infection.  She is sexually active, does not use condoms for protection.  LMP was last month and should be due around now.  Denies wanting STI testing.  Denies taking any chronic medications.   No Known Allergies  Past Medical History:  Diagnosis Date  . Medical history non-contributory      Past Surgical History:  Procedure Laterality Date  . CESAREAN SECTION N/A 09/16/2017   Procedure: CESAREAN SECTION;  Surgeon: Aletha Halim, MD;  Location: [facility];  Service: Obstetrics;  Laterality: N/A;  . NO PAST SURGERIES      Family History  Problem Relation Age of Onset  . Diabetes Maternal Grandmother   . Diabetes Paternal Grandmother     Social History   Tobacco Use  . Smoking status: Never Smoker  . Smokeless tobacco: Never Used  Substance Use Topics  . Alcohol use: No    Comment: occasionally   . Drug use: No    ROS   Objective:   Vitals: BP 122/85 (BP Location: Right Arm)   Pulse 68   Temp 98.8 F (37.1 C) (Oral)   Resp 18   SpO2 100%   Physical Exam Constitutional:      General: She is not in acute distress.    Appearance: Normal appearance. She is well-developed. She is obese. She is not ill-appearing, toxic-appearing or diaphoretic.  HENT:     Head: Normocephalic and atraumatic.     Right Ear: External ear normal.     Left Ear: External ear normal.     Nose: Nose normal.     Mouth/Throat:     Mouth: Mucous membranes are moist.     Pharynx: Oropharynx is clear.  Eyes:     General: No scleral icterus.    Extraocular Movements: Extraocular movements intact.     Pupils: Pupils are equal, round, and reactive to light.  Cardiovascular:   Rate and Rhythm: Normal rate and regular rhythm.     Pulses: Normal pulses.     Heart sounds: Normal heart sounds. No murmur. No friction rub. No gallop.   Pulmonary:     Effort: Pulmonary effort is normal. No respiratory distress.     Breath sounds: Normal breath sounds. No stridor. No wheezing, rhonchi or rales.  Abdominal:     General: Bowel sounds are normal. There is no distension.     Palpations: Abdomen is soft. There is no mass.     Tenderness: There is no abdominal tenderness. There is no right CVA tenderness, left CVA tenderness, guarding or rebound.  Musculoskeletal:     Comments: Mild back pain along lumbar paraspinal muscles of either side.  Strength 5/5 for lower extremities bilaterally.  Skin:    General: Skin is warm and dry.     Coloration: Skin is not pale.     Findings: No rash.  Neurological:     General: No focal deficit present.     Mental Status: She is alert and oriented to person, place, and time.     Motor: No weakness.     Coordination: Coordination normal.     Deep Tendon Reflexes: Reflexes normal.  Psychiatric:  Mood and Affect: Mood normal.        Behavior: Behavior normal.        Thought Content: Thought content normal.        Judgment: Judgment normal.     Results for orders placed or performed during the hospital encounter of 01/30/20 (from the past 24 hour(s))  POCT urinalysis dip (device)     Status: Abnormal   Collection Time: 01/30/20  4:59 PM  Result Value Ref Range   Glucose, UA NEGATIVE NEGATIVE mg/dL   Bilirubin Urine NEGATIVE NEGATIVE   Ketones, ur NEGATIVE NEGATIVE mg/dL   Specific Gravity, Urine 1.020 1.005 - 1.030   Hgb urine dipstick SMALL (A) NEGATIVE   pH 5.5 5.0 - 8.0   Protein, ur NEGATIVE NEGATIVE mg/dL   Urobilinogen, UA 0.2 0.0 - 1.0 mg/dL   Nitrite NEGATIVE NEGATIVE   Leukocytes,Ua SMALL (A) NEGATIVE  POC urine pregnancy     Status: None   Collection Time: 01/30/20  5:03 PM  Result Value Ref Range   Preg Test,  Ur NEGATIVE NEGATIVE  Pregnancy, urine POC     Status: None   Collection Time: 01/30/20  5:03 PM  Result Value Ref Range   Preg Test, Ur NEGATIVE NEGATIVE  POC urine preg, ED (not at [facility])     Status: None   Collection Time: 01/30/20  5:03 PM  Result Value Ref Range   Preg Test, Ur NEGATIVE NEGATIVE    Assessment and Plan :   1. Acute cystitis with hematuria   2. Dysuria   3. Acute bilateral low back pain without sciatica   4. Lower abdominal pain     Start Keflex to cover for cystitis, urine culture pending.  Will defer imaging given lack of trauma, falls.  Use naproxen and Zanaflex for management of her back pain. Counseled patient on potential for adverse effects with medications prescribed/recommended today, ER and return-to-clinic precautions discussed, patient verbalized understanding.    Wallis Bamberg, [place] 01/30/20 1720

## 2020-01-30 NOTE — ED Triage Notes (Signed)
Pt here for dysuria x 2 days and pt sts noticed some blood in her urine this am

## 2020-02-01 LAB — URINE CULTURE: Culture: 80000 — AB

## 2020-03-11 ENCOUNTER — Ambulatory Visit (INDEPENDENT_AMBULATORY_CARE_PROVIDER_SITE_OTHER): Payer: Medicaid Other

## 2020-03-11 ENCOUNTER — Other Ambulatory Visit: Payer: Self-pay

## 2020-03-11 ENCOUNTER — Encounter: Payer: Self-pay | Admitting: Family Medicine

## 2020-03-11 DIAGNOSIS — Z3201 Encounter for pregnancy test, result positive: Secondary | ICD-10-CM

## 2020-03-11 LAB — POCT PREGNANCY, URINE: Preg Test, Ur: POSITIVE — AB

## 2020-03-11 NOTE — Progress Notes (Signed)
Patient seen and assessed by nursing staff during this encounter. I have reviewed the chart and agree with the documentation and plan.  Vonzella Nipple, PA-C 03/11/2020 4:38 PM

## 2020-03-11 NOTE — Progress Notes (Signed)
Pt here today for pregnancy test resulted positivie.  Pt denies any pain or vaginal bleeding.  Pt reports LMP 02/01/20 which makes EDD 11/07/20 and is 5w 4d today.  Medications/allergies reviewed.  List of medications safe to take in pregnancy given.  Front office to provide proof of pregnancy letter.   Addison Naegeli, RN 03/11/20

## 2020-04-08 ENCOUNTER — Other Ambulatory Visit: Payer: Self-pay

## 2020-04-08 ENCOUNTER — Ambulatory Visit (INDEPENDENT_AMBULATORY_CARE_PROVIDER_SITE_OTHER): Payer: Medicaid Other | Admitting: *Deleted

## 2020-04-08 DIAGNOSIS — Z98891 History of uterine scar from previous surgery: Secondary | ICD-10-CM | POA: Insufficient documentation

## 2020-04-08 DIAGNOSIS — O0990 Supervision of high risk pregnancy, unspecified, unspecified trimester: Secondary | ICD-10-CM | POA: Diagnosis not present

## 2020-04-08 DIAGNOSIS — O99340 Other mental disorders complicating pregnancy, unspecified trimester: Secondary | ICD-10-CM

## 2020-04-08 DIAGNOSIS — O99210 Obesity complicating pregnancy, unspecified trimester: Secondary | ICD-10-CM | POA: Insufficient documentation

## 2020-04-08 DIAGNOSIS — F329 Major depressive disorder, single episode, unspecified: Secondary | ICD-10-CM

## 2020-04-08 DIAGNOSIS — Z8759 Personal history of other complications of pregnancy, childbirth and the puerperium: Secondary | ICD-10-CM

## 2020-04-08 DIAGNOSIS — F419 Anxiety disorder, unspecified: Secondary | ICD-10-CM

## 2020-04-08 DIAGNOSIS — F32A Depression, unspecified: Secondary | ICD-10-CM

## 2020-04-08 MED ORDER — BLOOD PRESSURE KIT DEVI: 1.0000 | 0 refills | Status: DC | PRN

## 2020-04-08 NOTE — Progress Notes (Signed)
I connected with  Jennifer Lynn on 04/08/20 at  3:30 PM EDT by telephone and verified that I am speaking with the correct person using two identifiers.   I discussed the limitations, risks, security and privacy concerns of performing an evaluation and management service by telephone and the availability of in person appointments. I also discussed with the patient that there may be a patient responsible charge related to this service. The patient expressed understanding and agreed to proceed.  I explained I am completing her New OB Intake today. We discussed Her EDD and that it is based on  sure LMP . I reviewed her allergies, meds, OB History, Medical /Surgical history, and appropriate screenings.  She had + PHQ9 and GAD 7 today. I informed her of [facility] services and offered appointment which she accepted.  I explained a registrar will contact her with an appointment.   Patient and/or legal guardian verbally consented to [facility] services about presenting concerns and psychiatric consultation as appropriate.   She also has had some food insecurity and transportation issues. I places resources in her AVS and explained we can give her at her next visit if she can't access.   I explained I will send her the [employer] app and app was sent to her while on phone.  I explained we will send a blood pressure cuff to [facility] that will fill that prescription and we called [facility] to verify they received her prescription and confirmed they will deliver to her home.  I asked her to bring the blood pressure cuff with her to her first ob appointment so we can show her how to use it. Explained  then we will have her take her blood pressure weekly and enter into the app. I explained she will have some visits in office and some virtually. She already has MyChart but it had expired. She still  Had app on her phone. I gave her number for MyChart help desk and asked her to call for  assistance to reactivate her account.  I reviewed her new ob  appointment date/ time with her , our location( I gave her our new address and phone number) and to wear mask, no visitors.  I explained she will have a pelvic exam, ob bloodwork, hemoglobin a1C, cbg ,pap, and  genetic testing if desired,- she does want a panorama. I scheduled an [place] at 19 weeks and gave her the appointment. She voices understanding.   Priyansh Pry,RN 04/08/2020  3:27 PM

## 2020-04-08 NOTE — Patient Instructions (Signed)
At [facility] [employer], we work as an integrated team, providing care to address both physical and emotional health. Your medical provider may refer you to see our [facility]) on the same day you see your medical provider, as availability permits.  Our [facility] is available to all patients, visits generally last between 20-30 minutes, but can be longer or shorter, depending on patient need. The [facility] offers help with stress management, coping with symptoms of depression and anxiety, major life changes , sleep issues, changing risky behavior, grief and loss, life stress, working on personal life goals, and  behavioral health issues, as these all affect your overall health and wellness.  The [facility] is NOT available for the following: court-ordered evaluations, specialty assessments (custody or disability), letters to employers, or obtaining certification for an emotional support animal. The [facility] does not provide long-term therapeutic services. You have the right to refuse integrated behavioral health services, or to reschedule to see the [facility] at a later date.  Exception: If you are having thoughts of suicide, we require that you either see the [facility] for further assessment, or contract for safety with your medical provider prior to checking out.  Confidentiality exception: If it is suspected that a child or disabled adult is being abused or neglected, we are required by law to report that to either Child Protective Services or Adult Scientist, forensic.  If you have a diagnosis of Bipolar affective disorder, Schizophrenia, or recurrent Major depressive disorder, we will recommend that you establish care with a psychiatrist, as these are lifelong, chronic conditions, and we want your overall emotional health and medications to be more closely monitored. If you anticipate needing extended maternity leave due to mental illness, it it recommended that you find a psychiatrist as soon as possible.  Neither the medical provider, nor the [facility], can recommend an extended maternity leave for mental health issues. Your medical provider or [facility] may refer you to a therapist for ongoing, traditional therapy, or to a psychiatrist, for medication management, if it would benefit your overall health. Depending on your insurance, you may have a copay to see the [facility]. If you are uninsured, it is recommended that you apply for financial assistance. (Forms may be requested at the front desk for in-person visits, via MyChart, or request a form during a virtual visit).  If you see the [facility] more than 6 times, you will have to complete a comprehensive clinical assessment interview with the [facility] to resume integrated services.  Any questions?    If you are in need of transportation to get to and from your appointments in our office.  You can reach Transportation Services by calling [phone] Monday - Friday  7am-6pm.  Transportation Resources [place], [place], [place] [address] https://www.[place].gov/departments/transportation/gdot-divisions/[place]-transit-agency-public-transportation-division     . Fixed-route bus services, including regional fare cards for PART, [place], [place], and WSTA buses.  . Reduced fare bus ID's available for Medicaid, Medicare, and "orange card" recipients.  [facility] offers curb-to-curb and door-to-door bus services for people with disabilities who are unable to use a fixed-bus route; also offers a shared-ride program.   Helpful tips:  -Routes available online and physical maps available at the main bus hub lobby (each for a specific route) -Smartphone directions often include bus routes (see the "bus" icon, next to the "car" and "walk" icons) -Routes differ on weekends, evenings and holidays, so plan ahead!  -If you  have Medicaid, Medicare, or orange card, plan to obtain a reduced-fare ID to  save 50% on rides. Check days and times to obtain an ID, and bring all necessary documents.   [employer] [place]) [address] [place], [place]. [address] [place], [place] [address] [phone] SpotApps.nl **Fixed-bus route services, and demand response bus service for older adults  Department of Social [facility] [address] [place], [place] [address] [phone] www.MysterySinger.com.cy **Medicaid transportation is available to [facility] recipients who need assistance getting to [facility] medical appointments and providers  [facility] [address] [place] [address], [place], [place] [address] www.cjmedicaltransportation.com  ** Offers non-emergency transportation for medical appointments  Wheels [address], [place], [place] [address] [phone] www.wheels4hope.org **REFERRAL NEEDED by specific agencies (see website), after meeting specified criteria only  [employer] for [employer]) [address] [place], [place] [address] [phone]  BuyingShow.es  *Regional fixed-bus routes between counties (example: [place] to [place]) and [employer] of [place]  [address], [place], [place] [address] [phone] Http://senior-resources-[place].org  Museum/gallery exhibitions officer available (call or see website for details)  Senior Adults Association-[place] [facility] [address] [place], [place] [address] [phone] www.senioradults.org  **Ride coordination    [employer]  Department of Social [facility] [address], [place], [place] [address] [phone]   or  www.https://hall.info/ **SNAP/EBT/ Other nutritional benefits  [facility] [address] [place], [place], [place]  [address] [phone]  or  https://king.net/ **WIC for  women who are pregnant and postpartum, infants and children up to 27 years old  [employer] Pant[employer] [address], [place], [place] [address] [phone]   or   www.theblessedtable.org  **Food pantry  [employer] [address] [place], [place], [place] [address] [phone]   or   https://brotherkolbes.godaddysites.com  **Emergency food and prepared meals  [address] [place] of [place] Food Pantry [address], [place], [place] [address] [phone]   or   www.cedargrovetop.us **Food pantry  [facility] Food Pantry [address], [place], [place] [address] [phone]   or   www.GolfingFamily.no **Food pantry  [facility] Food Pantry [address], [place], [place] [address] [phone] **Food pantry  [facility] [address] [place], [place] [address] [phone]   or   www.greensborourbanministry.org  Tour manager and prepared meals  [facility] [address] [place], [address] [place], [place], [place] [address] VerifiedMovies.gl  **Food pantry  [employer] Food Pantry [address] [place], [place] [address] [phone]   or   www.lbcnow.org  **Food pantry  One [employer] Further [address], [place], [place] [address] [phone]   or   WorkingMBA.co.nz **Food pantry, nutrition education, gardening activities  [facility] Food Pantry [address], [place], [place] [address] [phone] **Food pantry  [facility] [place] [address], [place], [place] [address] [phone]   or   www.salvationarmyofgreensboro.Roddie Mc of [place] [address] [place], [place] [address] [phone]   or    http://senior-resources-[place].org Dole Food on Wheels Program  [facility] [address] [place], [place] [address] [phone]   or   www.stmattchurch.com  **Food pantry  [facility] Food Pantry [address] [place], [place] [address] [phone]   or   vandaliapresbyterianchurch.org **Food pantry  [employer] Resources  United Stationers Pantry [address]  [place], [place] [address] [phone]   or   www.FightingMatch.com.ee Food Engineer, production of Churches [address] [place], [place] [address] [phone] **Food pantry  [facility] Food Resources   Department of [facility] [address] [place], [place] [address] [phone]   or   www.co.[place].us/ph/  [facility]-WIC [facility]) [address] [place], [place] [address] [phone]   or   https://www.rios-wells.com/ **WIC for pregnant and postpartum women, infants and children up to 20 years old  Compassionate Pantry [address], [place], [place] [address] [phone] **Food pantry  [facility] Food Pantry [address] [place], [place] [address] [phone]   or   emerywoodbaptistchurch.com *Food pantry  [employer] Food Pantry [address] [place], [place] [address] [phone]   or   www.fcchighpoint.Gerre ScullFood pantry  [facility] [address] [place], [place] [address] [phone]   or   http://www.bird.biz/ **Food pantry  [place] [facility] Food Pantry [address], [place], [place] [address] [phone]   or   www.facebook.com/KBCI1 Electronics engineer of [place] Food Pantry [address], [place], [place] [address] [phone]   or   www.abbottscreek.org [employer] of [place] [phone]   **Delivers meals  [facility] [address] [place], [place] [address] [phone]   or   nbfgm.sundaystreamwebsites.com  Tree surgeon of [employer] [address] [place], [place] [address] [phone]   or   www.odm-hp.org  **Food pantry  [address] [place] Food Pantry [address] [place], [place] [address] [phone]   or   R2live.tv **Food pantry  [facility] [address], [place], [place] [address] [phone]   or   WrestlingMonthly.pl **Emergency food and pet food  Senior Adults Association-[place] [facility] [address], [place], [place] [address] [phone]   or   www.senioradults.org **Congregate and delivered meals to older adults  [place] [employer] of Greater [employer] [address] [place], [place] [address] [phone]   or   https://www.miller-montoya.com/ **Back Pack Program for elementary school students  [facility] [address], [place], [place] [address] [phone]   or   www.wardstreetcommunityresources.org **Food pantry  [facility] [address] [place], [place] [address] [phone]   or   ResumeQuery.com.ee **Emergency food, nutrition classes, food budgeting

## 2020-04-09 NOTE — Progress Notes (Signed)
Chart reviewed for nurse visit. Agree with plan of care.   Marny Lowenstein, PA-C 04/09/2020 1:03 PM

## 2020-04-17 NOTE — BH Specialist Note (Signed)
Integrated Behavioral Health Initial Visit  MRN: [id number] Name: Jennifer Lynn  Number of Integrated Behavioral Health Clinician visits:: 1/6 Session Start time: 9:29  Session End time: 9:42 Total time: 13  Type of Service: Integrated Behavioral Health- Individual/Family Interpretor:No. Interpretor Name and Language: n/a    Warm Hand Off Completed.       SUBJECTIVE: Jennifer Lynn is a 24 y.o. female accompanied by n/a Patient was referred by Vonzella Nipple, PA-C for positive depression screen. Patient reports the following symptoms/concerns: Pt states her primary symptoms are oversleeping, fatigue, poor appetite, anxiety and difficulty relaxing, along with a panic attack this pregnancy.  Duration of problem: Current pregnancy; Severity of problem: moderate  OBJECTIVE: Mood: Normal and Affect: Appropriate Risk of harm to self or others: No plan to harm self or others  LIFE CONTEXT: Family and Social: Pt has 2yo daughter  School/Work: - Self-Care: - Life Changes: Current pregnancy  GOALS ADDRESSED: Patient will: 1. Reduce symptoms of: anxiety, depression and stress 2. Increase knowledge and/or ability of: healthy habits  3. Demonstrate ability to: Increase healthy adjustment to current life circumstances  INTERVENTIONS: Interventions utilized: Supportive Counseling and Psychoeducation and/or Health Education  Standardized Assessments completed: GAD-7 and PHQ 9  ASSESSMENT: Patient currently experiencing Adjustment disorder with anxiety.   Patient may benefit from psychoeducation and brief therapeutic interventions regarding coping with symptoms of anxiety  .  PLAN: 1. Follow up with behavioral health clinician on : Two weeks 2. Behavioral recommendations:  -Talk to medical provider today about any changes to prenatal vitamins; take as recommended -Read educational materials regarding coping with symptoms of anxiety with panic attacks -Consider apps, as  discussed, for self-care the next two weeks  3. Referral(s): Integrated [employer] (In Clinic)  [place], [place]

## 2020-04-25 ENCOUNTER — Other Ambulatory Visit (HOSPITAL_COMMUNITY)
Admission: RE | Admit: 2020-04-25 | Discharge: 2020-04-25 | Disposition: A | Discharge status: Home / Self Care | Payer: Medicaid Other | Source: Ambulatory Visit | Attending: Medical | Admitting: Medical

## 2020-04-25 ENCOUNTER — Other Ambulatory Visit: Payer: Self-pay

## 2020-04-25 ENCOUNTER — Ambulatory Visit (INDEPENDENT_AMBULATORY_CARE_PROVIDER_SITE_OTHER): Payer: Medicaid Other | Admitting: Clinical

## 2020-04-25 ENCOUNTER — Ambulatory Visit (INDEPENDENT_AMBULATORY_CARE_PROVIDER_SITE_OTHER): Payer: Medicaid Other | Admitting: Medical

## 2020-04-25 VITALS — BP 118/86 | HR 67 | Wt 199.0 lb

## 2020-04-25 DIAGNOSIS — O0991 Supervision of high risk pregnancy, unspecified, first trimester: Secondary | ICD-10-CM

## 2020-04-25 DIAGNOSIS — O99011 Anemia complicating pregnancy, first trimester: Secondary | ICD-10-CM

## 2020-04-25 DIAGNOSIS — D563 Thalassemia minor: Secondary | ICD-10-CM

## 2020-04-25 DIAGNOSIS — Z862 Personal history of diseases of the blood and blood-forming organs and certain disorders involving the immune mechanism: Secondary | ICD-10-CM

## 2020-04-25 DIAGNOSIS — O34219 Maternal care for unspecified type scar from previous cesarean delivery: Secondary | ICD-10-CM

## 2020-04-25 DIAGNOSIS — Z98891 History of uterine scar from previous surgery: Secondary | ICD-10-CM

## 2020-04-25 DIAGNOSIS — Z8759 Personal history of other complications of pregnancy, childbirth and the puerperium: Secondary | ICD-10-CM | POA: Diagnosis not present

## 2020-04-25 DIAGNOSIS — O99211 Obesity complicating pregnancy, first trimester: Secondary | ICD-10-CM

## 2020-04-25 DIAGNOSIS — O09891 Supervision of other high risk pregnancies, first trimester: Secondary | ICD-10-CM | POA: Diagnosis not present

## 2020-04-25 DIAGNOSIS — O0990 Supervision of high risk pregnancy, unspecified, unspecified trimester: Secondary | ICD-10-CM

## 2020-04-25 DIAGNOSIS — Z3A12 12 weeks gestation of pregnancy: Secondary | ICD-10-CM

## 2020-04-25 DIAGNOSIS — F4322 Adjustment disorder with anxiety: Secondary | ICD-10-CM

## 2020-04-25 MED ORDER — PRENATAL COMPLETE 14-0.4 MG PO TABS: 1.0000 | ORAL_TABLET | Freq: Every day | ORAL | 11 refills | Status: DC

## 2020-04-25 MED ORDER — ASPIRIN EC 81 MG PO TBEC: 81.0000 mg | DELAYED_RELEASE_TABLET | Freq: Every day | ORAL | 11 refills | Status: DC

## 2020-04-25 NOTE — Patient Instructions (Addendum)
Safe Medications in Pregnancy   Acne:  Benzoyl Peroxide  Salicylic Acid   Backache/Headache:  Tylenol: 2 regular strength every 4 hours OR        2 Extra strength every 6 hours   Colds/Coughs/Allergies:  Benadryl (alcohol free) 25 mg every 6 hours as needed  Breath right strips  Claritin  Cepacol throat lozenges  Chloraseptic throat spray  Cold-Eeze- up to three times per day  Cough drops, alcohol free  Flonase (by prescription only)  Guaifenesin  Mucinex  Robitussin DM (plain only, alcohol free)  Saline nasal spray/drops  Sudafed (pseudoephedrine) & Actifed * use only after [redacted] weeks gestation and if you do not have high blood pressure  Tylenol  Vicks Vaporub  Zinc lozenges  Zyrtec   Constipation:  Colace  Ducolax suppositories  Fleet enema  Glycerin suppositories  Metamucil  Milk of magnesia  Miralax  Senokot  Smooth move tea   Diarrhea:  Kaopectate  Imodium A-D   *NO pepto Bismol   Hemorrhoids:  Anusol  Anusol HC  Preparation H  Tucks   Indigestion:  Tums  Maalox  Mylanta  Zantac  Pepcid   Insomnia:  Benadryl (alcohol free) 25mg  every 6 hours as needed  Tylenol PM  Unisom, no Gelcaps   Leg Cramps:  Tums  MagGel   Nausea/Vomiting:  Bonine  Dramamine  Emetrol  Ginger extract  Sea bands  Meclizine  Nausea medication to take during pregnancy:  Unisom (doxylamine succinate 25 mg tablets) Take one tablet daily at bedtime. If symptoms are not adequately controlled, the dose can be increased to a maximum recommended dose of two tablets daily (1/2 tablet in the morning, 1/2 tablet mid-afternoon and one at bedtime).  Vitamin B6 100mg  tablets. Take one tablet twice a day (up to 200 mg per day).   Skin Rashes:  Aveeno products  Benadryl cream or 25mg  every 6 hours as needed  Calamine Lotion  1% cortisone cream   Yeast infection:  Gyne-lotrimin 7  Monistat 7    **If taking multiple medications, please check labels to avoid  duplicating the same active ingredients  **take medication as directed on the label  ** Do not exceed 4000 mg of tylenol in 24 hours  **Do not take medications that contain aspirin or ibuprofen         Coping with Panic Attacks   What is a panic attack?  You may have had a panic attack if you experienced four or more of the symptoms listed below coming on abruptly and peaking in about 10 minutes.  Panic Symptoms   . Pounding heart  . Sweating  . Trembling or shaking  . Shortness of breath  . Feeling of choking  . Chest pain  . Nausea or abdominal distress    . Feeling dizzy, unsteady, lightheaded, or faint  . Feelings of unreality or being detached from yourself  . Fear of losing control or going crazy  . Fear of dying  . Numbness or tingling  . Chills or hot flashes      Panic attacks are sometimes accompanied by avoidance of certain places or situations. These are often situations that would be difficult to escape from or in which help might not be available. Examples might include crowded shopping malls, public transportation, restaurants, or driving.   Why do panic attacks occur?   Panic attacks are the body's alarm system gone awry. All of us have a built-in alarm system, powered by adrenaline, which increases our  heart rate, breathing, and blood flow in response to danger. Ordinarily, this 'danger response system' works well. In some people, however, the response is either out of proportion to whatever stress is going on, or may come out of the blue without any stress at all.   For example, if you are walking in the woods and see a bear coming your way, a variety of changes occur in your body to prepare you to either fight the danger or flee from the situation. Your heart rate will increase to get more blood flow around your body, your breathing rate will quicken so that more oxygen is available, and your muscles will tighten in order to be ready to fight or run. You may  feel nauseated as blood flow leaves your stomach area and moves into your limbs. These bodily changes are all essential to helping you survive the dangerous situation. After the danger has passed, your body functions will begin to go back to normal. This is because your body also has a system for "recovering" by bringing your body back down to a normal state when the danger is over.   As you can see, the emergency response system is adaptive when there is, in fact, a "true" or "real" danger (e.g., bear). However, sometimes people find that their emergency response system is triggered in "everyday" situations where there really is no true physical danger (e.g., in a meeting, in the grocery store, while driving in normal traffic, etc.).   What triggers a panic attack?  Sometimes particularly stressful situations can trigger a panic attack. For example, an argument with your spouse or stressors at work can cause a stress response (activating the emergency response system) because you perceive it as threatening or overwhelming, even if there is no direct risk to your survival.  Sometimes panic attacks don't seem to be triggered by anything in particular- they may "come out of the blue". Somehow, the natural "fight or flight" emergency response system has gotten activated when there is no real danger. Why does the body go into "emergency mode" when there is no real danger?   Often, people with panic attacks are frightened or alarmed by the physical sensations of the emergency response system. First, unexpected physical sensations are experienced (tightness in your chest or some shortness of breath). This then leads to feeling fearful or alarmed by these symptoms ("Something's wrong!", "Am I having a heart attack?", "Am I going to faint?") The mind perceives that there is a danger even though no real danger exists. This, in turn, activates the emergency response system ("fight or flight"), leading to a "full  blown" panic attack. In summary, panic attacks occur when we misinterpret physical symptoms as signs of impending death, craziness, loss of control, embarrassment, or fear of fear. Sometimes you may be aware of thoughts of danger that activate the emergency response system (for example, thinking "I'm having a heart attack" when you feel chest pressure or increased heart rate). At other times, however, you may not be aware of such thoughts. After several incidences of being afraid of physical sensations, anxiety and panic can occur in response to the initial sensations without conscious thoughts of danger. Instead, you just feel afraid or alarmed. In other words, the panic or fear may seem to occur "automatically" without you consciously telling yourself anything.   After having had one or more panic attacks, you may also become more focused on what is going on inside your body. You may scan your  body and be more vigilant about noticing any symptoms that might signal the start of a panic attack. This makes it easier for panic attacks to happen again because you pick up on sensations you might otherwise not have noticed, and misinterpret them as something dangerous. A panic attack may then result.      How do I cope with panic attacks?  An important part of overcoming panic attacks involves re-interpreting your body's physical reactions and teaching yourself ways to decrease the physical arousal. This can be done through practicing the cognitive and behavioral interventions below.   Research has found that over half of people who have panic attacks show some signs of hyperventilation or overbreathing. This can produce initial sensations that alarm you and lead to a panic attack. Overbreathing can also develop as part of the panic attack and make the symptoms worse. When people hyperventilate, certain blood vessels in the body become narrower. In particular, the brain may get slightly less oxygen. This can  lead to the symptoms of dizziness, confusion, and lightheadedness that often occur during panic attacks. Other parts of the body may also get a bit less oxygen, which may lead to numbness or tingling in the hands or feet or the sensation of cold, clammy hands. It also may lead the heart to pump harder. Although these symptoms may be frightening and feel unpleasant, it is important to remember that hyperventilating is not dangerous. However, you can help overcome the unpleasantness of overbreathing by practicing Breathing Retraining.   Practice this basic technique three times a day, every day:  . Inhale. With your shoulders relaxed, inhale as slowly and deeply as you can while you count to six. If you can, use your diaphragm to fill your lungs with air.  . Hold. Keep the air in your lungs as you slowly count to four.  . Exhale. Slowly breath out as you count to six.  . Repeat. Do the inhale-hold-exhale cycle several times. Each time you do it, exhale for longer counts.  Like any new skill, Breathing Retraining requires practice. Try practicing this skill twice a day for several minutes. Initially, do not try this technique in specific situations or when you become frightened or have a panic attack. Begin by practicing in a quiet environment to build up your skill level so that you can later use it in time of "emergency."   2. Decreasing Avoidance  Regardless of whether you can identify why you began having panic attacks or whether they seemed to come out of the blue, the places where you began having panic attacks often can become triggers themselves. It is not uncommon for individuals to begin to avoid the places where they have had panic attacks. Over time, the individual may begin to avoid more and more places, thereby decreasing their activities and often negatively impacting their quality of life. To break the cycle of avoidance, it is important to first identify the places or situations that are  being avoided, and then to do some "relearning."  To begin this intervention, first create a list of locations or situations that you tend to avoid. Then choose an avoided location or situation that you would like to target first. Now develop an "exposure hierarchy" for this situation or location. An "exposure hierarchy" is a list of actions that make you feel anxious in this situation. Order these actions from least to most anxiety-producing. It is often helpful to have the first item on your hierarchy involve thinking or  imagining part of the feared/avoided situation.   Here is an example of an exposure hierarchy for decreasing avoidance of the grocery store. Note how it is ordered from the least amount of anxiety (at the top) to the most anxiety (at the bottom):  Marland Kitchen Think about going to the grocery store alone.  . Go to the grocery store with a friend or family member.  . Go to the grocery store alone to pick up a few small items (5-10 minutes in the store).  . Shopping for 10-20 minutes in the store alone.  . Doing the shopping for the week by myself (20-30 minutes in the store).   Your homework is to "expose" yourself to the lowest item on your hierarchy and use your breathing relaxation and coping statements (see below) to help you remain in the situation. Practice this several times during the upcoming week. Once you have mastered each item with minimal anxiety, move on to the next higher action on your list.   Cognitive Interventions  1. Identify your negative self-talk Anxious thoughts can increase anxiety symptoms and panic. The first step in changing anxious thinking is to identify your own negative, alarming self-talk. Some common alarming thoughts:  . I'm having a heart attack.            . I must be going crazy. . I think I'm dying. Marland Kitchen People will think I'm crazy. . I'm going to pass our.  . Oh no- here it comes.  . I can't stand this.  Peggye Form got to get out of here!  2. Use  positive coping statements Changing or disrupting a pattern of anxious thoughts by replacing them with more calming or supportive statements can help to divert a panic attack. Some common helpful coping statements:  . This is not an emergency.  . I don't like feeling this way, but I can accept it.  . I can feel like this and still be okay.  . This has happened before, and I was okay. I'll be okay this time, too.  . I can be anxious and still deal with this situation.   /Emotional Wellbeing Apps and Websites Here are a few free apps meant to help you to help yourself.  To find, try searching on the internet to see if the app is offered on Apple/Android devices. If your first choice doesn't come up on your device, the good news is that there are many choices! Play around with different apps to see which ones are helpful to you.    Calm This is an app meant to help increase calm feelings. Includes info, strategies, and tools for tracking your feelings.      Calm Harm  This app is meant to help with self-harm. Provides many 5-minute or 15-min coping strategies for doing instead of hurting yourself.       Healthy Minds Health Minds is a problem-solving tool to help deal with emotions and cope with stress you encounter wherever you are.      MindShift This app can help people cope with anxiety. Rather than trying to avoid anxiety, you can make an important shift and face it.      MY3  MY3 features a support system, safety plan and resources with the goal of offering a tool to use in a time of need.       My Life My Voice  This mood journal offers a simple solution for tracking your thoughts, feelings and moods.  Animated emoticons can help identify your mood.       Relax Melodies Designed to help with sleep, on this app you can mix sounds and meditations for relaxation.      Smiling Mind Smiling Mind is meditation made easy: it's a simple tool that helps put a smile on your  mind.        Stop, Breathe & Think  A friendly, simple guide for people through meditations for mindfulness and compassion.  Stop, Breathe and Think Kids Enter your current feelings and choose a "mission" to help you cope. Offers videos for certain moods instead of just sound recordings.       Team Orange The goal of this tool is to help teens change how they think, act, and react. This app helps you focus on your own good feelings and experiences.      The [employer] Box The [employer] (V[employer]) contains simple tools to help patients with coping, relaxation, distraction, and positive thinking.

## 2020-04-25 NOTE — Progress Notes (Signed)
PRENATAL VISIT NOTE  Subjective:  Jennifer Lynn is a 24 y.o. G2P1001 at 78w0dbeing seen today for her first prenatal visit for this pregnancy.  She is currently monitored for the following issues for this high-risk pregnancy and has Morbid obesity with BMI of 40.0-44.9, adult ([place]; Alpha thalassemia trait; History of pre-eclampsia; Supervision of high risk pregnancy, antepartum; Obesity affecting pregnancy; and History of C-section on their problem list.  Patient reports nausea and occasional abdominal and back pain.  Contractions: Not present. Vag. Bleeding: None.  Movement: Absent. Denies leaking of fluid.   She is planning to breastfeed. Unsure about contraception.   The following portions of the patient's history were reviewed and updated as appropriate: allergies, current medications, past family history, past medical history, past social history, past surgical history and problem list.   Objective:   Vitals:   04/25/20 0949  BP: 118/86  Pulse: 67  Weight: 199 lb (90.3 kg)    Fetal Status: Fetal Heart Rate (bpm): 164   Movement: Absent     General:  Alert, oriented and cooperative. Patient is in no acute distress.  Skin: Skin is warm and dry. No rash noted.   Cardiovascular: Normal heart rate and rhythm noted  Respiratory: Normal respiratory effort, no problems with respiration noted. Clear to auscultation.   Abdomen: Soft, gravid, appropriate for gestational age. Normal bowel sounds. Non-tender. Pain/Pressure: Present     Pelvic: Cervical exam performed Dilation: Closed Effacement (%): Thick   Normal cervical contour, no lesions, no bleeding following pap, normal discharge  Extremities: Normal range of motion.  Edema: None  Mental Status: Normal mood and affect. Normal behavior. Normal judgment and thought content.   Assessment and Plan:  Pregnancy: G2P1001 at 181w0d. Supervision of high risk pregnancy, antepartum - Culture, OB Urine - Genetic Screening -  Panorama and Horizon - Hemoglobin A1c - CBC/D/Plt+RPR+Rh+ABO+Rub Ab... - Cytology - PAP( ) - Prenatal Vit-Fe Fumarate-FA (PRENATAL COMPLETE) 14-0.4 MG TABS; Take 1 tablet by mouth daily.  Dispense: 60 tablet; Refill: 11  2. History of pre-eclampsia - Protein / creatinine ratio, urine - Comp Met (CMET) - aspirin EC 81 MG tablet; Take 1 tablet (81 mg total) by mouth daily.  Dispense: 30 tablet; Refill: 11  3. Obesity affecting pregnancy in first trimester - aspirin EC 81 MG tablet; Take 1 tablet (81 mg total) by mouth daily.  Dispense: 30 tablet; Refill: 11  4. History of C-section - Desires repeat, will sign consent and see MD at 28 weeks   5. Alpha thalassemia trait - Horizon today and then will consider partner testing through [facility]   6. History of anemia - CBC today - Has difficulty with PO iron, consider Fereheme depending on results today   Preterm labor/first trimester warning symptoms and general obstetric precautions including but not limited to vaginal bleeding, contractions, leaking of fluid and fetal movement were reviewed in detail with the patient. Please refer to After Visit Summary for other counseling recommendations.   Discussed the normal visit cadence for prenatal care Discussed the nature of our practice with multiple providers including residents and students   Return in about 4 weeks (around 05/23/2020) for LOB.  Future Appointments  Date Time Provider [place]5/28/2021 10:45 AM [facility]06[facility]2020  8:55 AM WiCherre BlancMD [facility]  8:15 AM [facility] NURSE [facility]2020  8:15 AM [facility] US2 [facility]  Kerry Hough, PA-C

## 2020-04-25 NOTE — Assessment & Plan Note (Signed)
Repeat C/S - patient's choice

## 2020-04-26 LAB — CBC/D/PLT+RPR+RH+ABO+RUB AB...
Antibody Screen: NEGATIVE
Basophils Absolute: 0 10*3/uL (ref 0.0–0.2)
Basos: 0 %
EOS (ABSOLUTE): 0.1 10*3/uL (ref 0.0–0.4)
Eos: 2 %
HCV Ab: <0.1 s/co ratio (ref 0.0–0.9)
HIV Screen 4th Generation wRfx: NONREACTIVE
Hematocrit: 40.4 % (ref 34.0–46.6)
Hemoglobin: 13.4 g/dL (ref 11.1–15.9)
Hepatitis B Surface Ag: NEGATIVE
Immature Grans (Abs): 0 10*3/uL (ref 0.0–0.1)
Immature Granulocytes: 0 %
Lymphocytes Absolute: 1.6 10*3/uL (ref 0.7–3.1)
Lymphs: 27 %
MCH: 27.7 pg (ref 26.6–33.0)
MCHC: 33.2 g/dL (ref 31.5–35.7)
MCV: 84 fL (ref 79–97)
Monocytes Absolute: 0.5 10*3/uL (ref 0.1–0.9)
Monocytes: 8 %
Neutrophils Absolute: 3.9 10*3/uL (ref 1.4–7.0)
Neutrophils: 63 %
Platelets: 288 10*3/uL (ref 150–450)
RBC: 4.83 x10E6/uL (ref 3.77–5.28)
RDW: 13.5 % (ref 11.7–15.4)
RPR Ser Ql: NONREACTIVE
Rh Factor: POSITIVE
Rubella Antibodies, IGG: 3.3 index (ref >0.99)
WBC: 6.1 10*3/uL (ref 3.4–10.8)

## 2020-04-26 LAB — COMPREHENSIVE METABOLIC PANEL
ALT: 18 IU/L (ref 0–32)
AST: 17 IU/L (ref 0–40)
Albumin/Globulin Ratio: 1.8 (ref 1.2–2.2)
Albumin: 4.9 g/dL (ref 3.9–5.0)
Alkaline Phosphatase: 61 IU/L (ref 39–117)
BUN/Creatinine Ratio: 10 (ref 9–23)
BUN: 7 mg/dL (ref 6–20)
Bilirubin Total: 0.4 mg/dL (ref 0.0–1.2)
CO2: 20 mmol/L (ref 20–29)
Calcium: 9.9 mg/dL (ref 8.7–10.2)
Chloride: 99 mmol/L (ref 96–106)
Creatinine, Ser: 0.69 mg/dL (ref 0.57–1.00)
GFR calc Af Amer: 140 mL/min/{1.73_m2} (ref >59)
GFR calc non Af Amer: 121 mL/min/{1.73_m2} (ref >59)
Globulin, Total: 2.8 g/dL (ref 1.5–4.5)
Glucose: 81 mg/dL (ref 65–99)
Potassium: 3.9 mmol/L (ref 3.5–5.2)
Sodium: 136 mmol/L (ref 134–144)
Total Protein: 7.7 g/dL (ref 6.0–8.5)

## 2020-04-26 LAB — HCV INTERPRETATION

## 2020-04-26 LAB — PROTEIN / CREATININE RATIO, URINE
Creatinine, Urine: 355.8 mg/dL
Protein, Ur: 20.2 mg/dL
Protein/Creat Ratio: 57 mg/g creat (ref 0–200)

## 2020-04-26 LAB — HEMOGLOBIN A1C
Est. average glucose Bld gHb Est-mCnc: 100 mg/dL
Hgb A1c MFr Bld: 5.1 % (ref 4.8–5.6)

## 2020-04-27 LAB — CULTURE, OB URINE

## 2020-04-27 LAB — URINE CULTURE, OB REFLEX: Organism ID, Bacteria: NO GROWTH

## 2020-04-28 LAB — CYTOLOGY - PAP
Chlamydia: NEGATIVE
Comment: NEGATIVE
Comment: NORMAL
Diagnosis: NEGATIVE
Neisseria Gonorrhea: NEGATIVE

## 2020-05-01 NOTE — BH Specialist Note (Signed)
Integrated Behavioral Health Follow Up Visit  MRN: [id number] Name: Jennifer Lynn  Number of Integrated Behavioral Health Clinician visits: 2/6 Session Start time: 10:47  Session End time: 11:24 Total time: 37  Type of Service: Integrated Behavioral Health- Individual/Family Interpretor:No. Interpretor Name and Language: n/a  SUBJECTIVE: Jennifer Lynn is a 24 y.o. female accompanied by n/a Patient was referred by Vonzella Nipple, PA-C for positive depression screen. Patient reports the following symptoms/concerns: Pt states she has had no panic attacks since previous visit, is using relaxation apps daily, taking new prenatal vitamin, feeling more energetic; primary symptoms are irritability and difficulty relaxing; open to learn additional self-coping strategy today.  Duration of problem: Current pregnancy; Severity of problem: moderate  OBJECTIVE: Mood: Normal and Affect: Appropriate Risk of harm to self or others: No plan to harm self or others  LIFE CONTEXT: Family and Social: Pt has 2yo daughter School/Work: - Self-Care: Recognizing a greater need for self-care; implementing self-coping strategies Life Changes: Current pregnancy  GOALS ADDRESSED: Patient will: 1.  Reduce symptoms of: anxiety, depression and stress  2.  Increase knowledge and/or ability of: self-management skills  3.  Demonstrate ability to: Increase healthy adjustment to current life circumstances  INTERVENTIONS: Interventions utilized:  Mindfulness or Management consultant and Psychoeducation and/or Health Education Standardized Assessments completed: GAD-7 and PHQ 9  ASSESSMENT: Patient currently experiencing Adjustment disorder with anxiety.   Patient may benefit from continued psychoeducation and brief therapeutic interventions regarding coping with symptoms of anxiety  .  PLAN: 1. Follow up with behavioral health clinician on : As needed. Contact Arnaldo Heffron at [phone] if symptoms  increase 2. Behavioral recommendations:  -Continue using sleep sound app at night for improved sleep -Begin using CALM relaxation breathing exercise at bedtime -Continue taking prenatal vitamin as recommended by medical provider -Share Virtual [facility] of [facility] with support persons 3. Referral(s): Integrated Behavioral Health Services (In Clinic) 4. "From scale of 1-10, how likely are you to follow plan?": 10  Rae Lips, LCSW  Depression screen [facility] 2/9 05/09/2020 04/25/2020 04/08/2020 10/12/2018 08/18/2018  Decreased Interest 1 1 3  0 0  Down, Depressed, Hopeless 0 0 2 2 0  PHQ - 2 Score 1 1 5 2  0  Altered sleeping 1 3 2 2  0  Tired, decreased energy 1 3 1 2 3   Change in appetite 0 3 3 1 3   Feeling bad or failure about yourself  0 0 0 0 0  Trouble concentrating 0 0 0 0 1  Moving slowly or fidgety/restless 0 0 0 1 1  Suicidal thoughts 0 0 0 0 0  PHQ-9 Score 3 10 11 8 8   Some recent data might be hidden   GAD 7 : Generalized Anxiety Score 04/25/2020 04/08/2020 10/12/2018 08/18/2018  Nervous, Anxious, on Edge 3 3 2  0  Control/stop worrying 0 3 1 0  Worry too much - different things 1 3 1 2   Trouble relaxing 3 3 1  0  Restless 0 0 - 0  Easily annoyed or irritable 2 1 2 2   Afraid - awful might happen 0 3 1 0  Total GAD 7 Score 9 16 - 4

## 2020-05-05 ENCOUNTER — Encounter: Payer: Self-pay | Admitting: Medical

## 2020-05-09 ENCOUNTER — Ambulatory Visit (INDEPENDENT_AMBULATORY_CARE_PROVIDER_SITE_OTHER): Payer: Medicaid Other | Admitting: Clinical

## 2020-05-09 DIAGNOSIS — Z3A00 Weeks of gestation of pregnancy not specified: Secondary | ICD-10-CM | POA: Diagnosis not present

## 2020-05-09 DIAGNOSIS — O99340 Other mental disorders complicating pregnancy, unspecified trimester: Secondary | ICD-10-CM

## 2020-05-09 DIAGNOSIS — F4322 Adjustment disorder with anxiety: Secondary | ICD-10-CM

## 2020-05-09 NOTE — Patient Instructions (Signed)
[facility] at  [facility] [address] [place], [place] [address] [phone] (main office) [phone] (Samyah Bilbo's office)   

## 2020-05-13 ENCOUNTER — Encounter: Payer: Self-pay | Admitting: *Deleted

## 2020-05-19 ENCOUNTER — Encounter: Payer: Medicaid Other | Admitting: Obstetrics & Gynecology

## 2020-06-05 ENCOUNTER — Telehealth: Payer: Self-pay | Admitting: Family Medicine

## 2020-06-05 ENCOUNTER — Encounter: Payer: Self-pay | Admitting: Family Medicine

## 2020-06-05 NOTE — Telephone Encounter (Signed)
Patient called this morning to cancel her upcoming appointment, state she can not make another appointment at this time due to her work schedule, patient state she will call our office when she can make another appointment. Patient is aware if she have any problems she can need to call our office or she can go to [facility].

## 2020-06-09 ENCOUNTER — Encounter: Payer: Medicaid Other | Admitting: Obstetrics and Gynecology

## 2020-06-12 ENCOUNTER — Other Ambulatory Visit: Payer: Self-pay

## 2020-06-12 ENCOUNTER — Ambulatory Visit (INDEPENDENT_AMBULATORY_CARE_PROVIDER_SITE_OTHER): Payer: Medicaid Other | Admitting: Medical

## 2020-06-12 ENCOUNTER — Encounter: Payer: Self-pay | Admitting: Medical

## 2020-06-12 VITALS — BP 123/73 | HR 100 | Wt 201.9 lb

## 2020-06-12 DIAGNOSIS — Z8759 Personal history of other complications of pregnancy, childbirth and the puerperium: Secondary | ICD-10-CM

## 2020-06-12 DIAGNOSIS — D563 Thalassemia minor: Secondary | ICD-10-CM

## 2020-06-12 DIAGNOSIS — O0990 Supervision of high risk pregnancy, unspecified, unspecified trimester: Secondary | ICD-10-CM | POA: Diagnosis not present

## 2020-06-12 DIAGNOSIS — Z3A18 18 weeks gestation of pregnancy: Secondary | ICD-10-CM

## 2020-06-12 DIAGNOSIS — O0992 Supervision of high risk pregnancy, unspecified, second trimester: Secondary | ICD-10-CM

## 2020-06-12 DIAGNOSIS — O99212 Obesity complicating pregnancy, second trimester: Secondary | ICD-10-CM

## 2020-06-12 DIAGNOSIS — E669 Obesity, unspecified: Secondary | ICD-10-CM

## 2020-06-12 DIAGNOSIS — O99211 Obesity complicating pregnancy, first trimester: Secondary | ICD-10-CM

## 2020-06-12 DIAGNOSIS — Z98891 History of uterine scar from previous surgery: Secondary | ICD-10-CM

## 2020-06-12 MED ORDER — ASPIRIN EC 81 MG PO TBEC: 81.0000 mg | DELAYED_RELEASE_TABLET | Freq: Every day | ORAL | 11 refills | Status: DC

## 2020-06-12 NOTE — Progress Notes (Signed)
   PRENATAL VISIT NOTE  Subjective:  Jennifer Lynn is a 24 y.o. G2P1001 at [redacted]w[redacted]d being seen today for ongoing prenatal care.  She is currently monitored for the following issues for this high-risk pregnancy and has Morbid obesity with BMI of 40.0-44.9, adult (HCC); Alpha thalassemia trait; History of pre-eclampsia; Supervision of high risk pregnancy, antepartum; Obesity affecting pregnancy; and History of C-section on their problem list.  Patient reports backache and round ligament pain.  Contractions: Not present. Vag. Bleeding: None.  Movement: Present. Denies leaking of fluid.   The following portions of the patient's history were reviewed and updated as appropriate: allergies, current medications, past family history, past medical history, past social history, past surgical history and problem list.   Objective:   Vitals:   06/12/20 1635  BP: 123/73  Pulse: 100  Weight: 201 lb 14.4 oz (91.6 kg)    Fetal Status: Fetal Heart Rate (bpm): 160   Movement: Present     General:  Alert, oriented and cooperative. Patient is in no acute distress.  Skin: Skin is warm and dry. No rash noted.   Cardiovascular: Normal heart rate noted  Respiratory: Normal respiratory effort, no problems with respiration noted  Abdomen: Soft, gravid, appropriate for gestational age.  Pain/Pressure: Absent     Pelvic: Cervical exam deferred        Extremities: Normal range of motion.  Edema: None  Mental Status: Normal mood and affect. Normal behavior. Normal judgment and thought content.   Assessment and Plan:  Pregnancy: G2P1001 at [redacted]w[redacted]d 1. Supervision of high risk pregnancy, antepartum - AFP, Serum, Open Spina Bifida - Discussed normal NOB labs including low risk NIPS  2. Obesity affecting pregnancy in second trimester - Advised to start ASA   3. History of C-section - Planning repeat  4. History of pre-eclampsia - Did not pick up initially so Rx resent  - aspirin EC 81 MG tablet; Take 1  tablet (81 mg total) by mouth daily.  Dispense: 30 tablet; Refill: 11  5. Alpha thalassemia trait - Found with first pregnancy, had GC at that time, FOB tested previously  6. Obesity affecting pregnancy in first trimester - aspirin EC 81 MG tablet; Take 1 tablet (81 mg total) by mouth daily.  Dispense: 30 tablet; Refill: 11  Preterm labor/second trimester warning symptoms and general obstetric precautions including but not limited to vaginal bleeding, contractions, leaking of fluid and fetal movement were reviewed in detail with the patient. Please refer to After Visit Summary for other counseling recommendations.   Return in about 4 weeks (around 07/10/2020) for [facility] APP, In-Person.  Future Appointments  Date Time Provider Department Center  06/18/2020  8:15 AM [facility] NURSE [facility]  06/18/2020  8:15 AM [facility]    Vonzella Nipple, PA-C

## 2020-06-12 NOTE — Patient Instructions (Signed)

## 2020-06-14 LAB — AFP, SERUM, OPEN SPINA BIFIDA
AFP MoM: 0.97
AFP Value: 39.6 ng/mL
Gest. Age on Collection Date: 18 weeks
Maternal Age At EDD: 26.1 yr
OSBR Risk 1 IN: 10000
Test Results:: NEGATIVE
Weight: 201 [lb_av]

## 2020-06-18 ENCOUNTER — Other Ambulatory Visit: Payer: Self-pay

## 2020-06-18 ENCOUNTER — Encounter: Payer: Self-pay | Admitting: Medical

## 2020-06-18 ENCOUNTER — Ambulatory Visit: Payer: Medicaid Other | Admitting: *Deleted

## 2020-06-18 ENCOUNTER — Ambulatory Visit: Payer: Medicaid Other | Attending: Medical

## 2020-06-18 DIAGNOSIS — O34219 Maternal care for unspecified type scar from previous cesarean delivery: Secondary | ICD-10-CM | POA: Diagnosis not present

## 2020-06-18 DIAGNOSIS — O99210 Obesity complicating pregnancy, unspecified trimester: Secondary | ICD-10-CM | POA: Insufficient documentation

## 2020-06-18 DIAGNOSIS — O0990 Supervision of high risk pregnancy, unspecified, unspecified trimester: Secondary | ICD-10-CM

## 2020-06-18 DIAGNOSIS — Z363 Encounter for antenatal screening for malformations: Secondary | ICD-10-CM

## 2020-06-18 DIAGNOSIS — O99212 Obesity complicating pregnancy, second trimester: Secondary | ICD-10-CM

## 2020-06-18 DIAGNOSIS — Z98891 History of uterine scar from previous surgery: Secondary | ICD-10-CM | POA: Diagnosis not present

## 2020-06-18 DIAGNOSIS — Z8759 Personal history of other complications of pregnancy, childbirth and the puerperium: Secondary | ICD-10-CM

## 2020-06-18 DIAGNOSIS — O358XX1 Maternal care for other (suspected) fetal abnormality and damage, fetus 1: Secondary | ICD-10-CM | POA: Insufficient documentation

## 2020-06-18 DIAGNOSIS — O09292 Supervision of pregnancy with other poor reproductive or obstetric history, second trimester: Secondary | ICD-10-CM

## 2020-06-18 DIAGNOSIS — Z3A19 19 weeks gestation of pregnancy: Secondary | ICD-10-CM

## 2020-06-18 DIAGNOSIS — Z148 Genetic carrier of other disease: Secondary | ICD-10-CM

## 2020-06-18 DIAGNOSIS — E669 Obesity, unspecified: Secondary | ICD-10-CM

## 2020-06-18 DIAGNOSIS — O35BXX1 Maternal care for other (suspected) fetal abnormality and damage, fetal cardiac anomalies, fetus 1: Secondary | ICD-10-CM | POA: Insufficient documentation

## 2020-07-06 DIAGNOSIS — Z20822 Contact with and (suspected) exposure to covid-19: Secondary | ICD-10-CM | POA: Diagnosis not present

## 2020-07-07 ENCOUNTER — Encounter: Payer: Self-pay | Admitting: Medical

## 2020-07-08 ENCOUNTER — Encounter: Payer: Medicaid Other | Admitting: Student

## 2020-07-15 ENCOUNTER — Other Ambulatory Visit: Payer: Self-pay

## 2020-07-15 ENCOUNTER — Inpatient Hospital Stay (HOSPITAL_COMMUNITY)
Admission: AD | Admit: 2020-07-15 | Discharge: 2020-07-15 | Disposition: A | Discharge status: Home / Self Care | Payer: Medicaid Other | Attending: Family Medicine | Admitting: Family Medicine

## 2020-07-15 ENCOUNTER — Inpatient Hospital Stay (HOSPITAL_BASED_OUTPATIENT_CLINIC_OR_DEPARTMENT_OTHER): Payer: Medicaid Other

## 2020-07-15 ENCOUNTER — Encounter (HOSPITAL_COMMUNITY): Payer: Self-pay | Admitting: Family Medicine

## 2020-07-15 DIAGNOSIS — O26892 Other specified pregnancy related conditions, second trimester: Secondary | ICD-10-CM

## 2020-07-15 DIAGNOSIS — O09292 Supervision of pregnancy with other poor reproductive or obstetric history, second trimester: Secondary | ICD-10-CM

## 2020-07-15 DIAGNOSIS — Z3A23 23 weeks gestation of pregnancy: Secondary | ICD-10-CM | POA: Diagnosis not present

## 2020-07-15 DIAGNOSIS — Z7982 Long term (current) use of aspirin: Secondary | ICD-10-CM | POA: Insufficient documentation

## 2020-07-15 DIAGNOSIS — R109 Unspecified abdominal pain: Secondary | ICD-10-CM | POA: Diagnosis not present

## 2020-07-15 DIAGNOSIS — Z3686 Encounter for antenatal screening for cervical length: Secondary | ICD-10-CM

## 2020-07-15 DIAGNOSIS — B9689 Other specified bacterial agents as the cause of diseases classified elsewhere: Secondary | ICD-10-CM | POA: Diagnosis not present

## 2020-07-15 DIAGNOSIS — M549 Dorsalgia, unspecified: Secondary | ICD-10-CM | POA: Insufficient documentation

## 2020-07-15 DIAGNOSIS — O99891 Other specified diseases and conditions complicating pregnancy: Secondary | ICD-10-CM | POA: Diagnosis not present

## 2020-07-15 DIAGNOSIS — O34219 Maternal care for unspecified type scar from previous cesarean delivery: Secondary | ICD-10-CM | POA: Insufficient documentation

## 2020-07-15 DIAGNOSIS — O0990 Supervision of high risk pregnancy, unspecified, unspecified trimester: Secondary | ICD-10-CM | POA: Diagnosis not present

## 2020-07-15 DIAGNOSIS — E669 Obesity, unspecified: Secondary | ICD-10-CM | POA: Insufficient documentation

## 2020-07-15 DIAGNOSIS — O358XX0 Maternal care for other (suspected) fetal abnormality and damage, not applicable or unspecified: Secondary | ICD-10-CM

## 2020-07-15 DIAGNOSIS — O26852 Spotting complicating pregnancy, second trimester: Secondary | ICD-10-CM | POA: Diagnosis not present

## 2020-07-15 DIAGNOSIS — O23592 Infection of other part of genital tract in pregnancy, second trimester: Secondary | ICD-10-CM | POA: Diagnosis not present

## 2020-07-15 DIAGNOSIS — O99212 Obesity complicating pregnancy, second trimester: Secondary | ICD-10-CM | POA: Diagnosis not present

## 2020-07-15 DIAGNOSIS — Z148 Genetic carrier of other disease: Secondary | ICD-10-CM

## 2020-07-15 DIAGNOSIS — Z79899 Other long term (current) drug therapy: Secondary | ICD-10-CM | POA: Diagnosis not present

## 2020-07-15 DIAGNOSIS — Z98891 History of uterine scar from previous surgery: Secondary | ICD-10-CM

## 2020-07-15 DIAGNOSIS — O358XX1 Maternal care for other (suspected) fetal abnormality and damage, fetus 1: Secondary | ICD-10-CM

## 2020-07-15 DIAGNOSIS — O35BXX1 Maternal care for other (suspected) fetal abnormality and damage, fetal cardiac anomalies, fetus 1: Secondary | ICD-10-CM

## 2020-07-15 DIAGNOSIS — R519 Headache, unspecified: Secondary | ICD-10-CM | POA: Diagnosis not present

## 2020-07-15 DIAGNOSIS — N760 Acute vaginitis: Secondary | ICD-10-CM | POA: Insufficient documentation

## 2020-07-15 DIAGNOSIS — Z3689 Encounter for other specified antenatal screening: Secondary | ICD-10-CM | POA: Insufficient documentation

## 2020-07-15 LAB — WET PREP, GENITAL
Sperm: NONE SEEN
Trich, Wet Prep: NONE SEEN
Yeast Wet Prep HPF POC: NONE SEEN

## 2020-07-15 LAB — FETAL FIBRONECTIN: Fetal Fibronectin: NEGATIVE

## 2020-07-15 MED ORDER — CYCLOBENZAPRINE HCL 5 MG PO TABS
10.0000 mg | ORAL_TABLET | Freq: Once | ORAL | Status: AC
  Administered 2020-07-15: 10 mg via ORAL
  Filled 2020-07-15: qty 2

## 2020-07-15 MED ORDER — COMFORT FIT MATERNITY SUPP LG MISC
1.0000 [IU] | Freq: Every day | 0 refills | Status: DC
Start: 2020-07-15 — End: 2020-10-07

## 2020-07-15 MED ORDER — METRONIDAZOLE 500 MG PO TABS
500.0000 mg | ORAL_TABLET | Freq: Two times a day (BID) | ORAL | 0 refills | Status: DC
Start: 2020-07-15 — End: 2020-09-10

## 2020-07-15 MED ORDER — METOCLOPRAMIDE HCL 10 MG PO TABS
10.0000 mg | ORAL_TABLET | Freq: Once | ORAL | Status: AC
  Administered 2020-07-15: 10 mg via ORAL
  Filled 2020-07-15: qty 1

## 2020-07-15 MED ORDER — ONDANSETRON 4 MG PO TBDP: 4.0000 mg | ORAL_TABLET | Freq: Three times a day (TID) | ORAL | 2 refills | Status: DC | PRN

## 2020-07-15 MED ORDER — DIPHENHYDRAMINE HCL 25 MG PO CAPS
25.0000 mg | ORAL_CAPSULE | Freq: Once | ORAL | Status: AC
  Administered 2020-07-15: 25 mg via ORAL
  Filled 2020-07-15: qty 1

## 2020-07-15 MED ORDER — CYCLOBENZAPRINE HCL 10 MG PO TABS
10.0000 mg | ORAL_TABLET | Freq: Two times a day (BID) | ORAL | 0 refills | Status: DC | PRN
Start: 2020-07-15 — End: 2020-10-07

## 2020-07-15 NOTE — MAU Note (Signed)
PT SAYS HAS H/A - STARTED  TODAY 2PM- DID NOT TAKE ANY MEDS- DOES NOT LIKE TO TAKE MEDS.  HAS SPOTTING - STARTED AT 2 PM. PNC- WITH CLINIC. Marland Kitchen LAST SEX- 7-31.

## 2020-07-15 NOTE — Discharge Instructions (Signed)

## 2020-07-15 NOTE — MAU Provider Note (Signed)
History     CSN: [id number]  Arrival date and time: 07/15/20 2121   First Provider Initiated Contact with Patient 07/15/20 2200      Chief Complaint  Patient presents with  . Headache  . Vaginal Bleeding   Jennifer Lynn is a 24 y.o. G2P1 at 57w4dwho presents to MAU with complaints of headache, vaginal spotting, abdominal cramping. Patient reports symptoms started occurring today around 2pm. She reports history of headaches and nausea throughout the pregnancy, denies being on any medication for headaches or nausea.  Rates headache 5 out of 10 has not taking any medication for pain.  She reports lower abdominal that is intermittent and radiates around to her back.  She describes the pain as sharp shooting cramping.  Rates pain 5 out of 10.  Pain is also associated with vaginal spotting.  She describes vaginal spotting as bright red spotting when she wiped.  She denies having to wear a pad or panty liner for bleeding.  She denies any problems or complications during this pregnancy.  She denies history of preterm labor or preterm delivery.  She reports last intercourse 3 to 4 days ago.    OB History    Gravida  2   Para  1   Term  1   Preterm  0   AB  0   Living  1     SAB  0   TAB  0   Ectopic  0   Multiple  0   Live Births  1           Past Medical History:  Diagnosis Date  . Anemia   . Blood transfusion without reported diagnosis    patient thinks she had after previous delivery  . Medical history non-contributory   . UTI (urinary tract infection)     Past Surgical History:  Procedure Laterality Date  . CESAREAN SECTION N/A 09/16/2017   Procedure: CESAREAN SECTION;  Surgeon: PAletha Halim MD;  Location[place] [facility]  Service: Obstetrics;  Laterality: N/A;    Family History  Problem Relation Age of Onset  . Diabetes Maternal Grandmother   . Diabetes Paternal Grandmother   . Hypertension Mother   . Hypertension Father     Social  History   Tobacco Use  . Smoking status: Never Smoker  . Smokeless tobacco: Never Used  Vaping Use  . Vaping Use: Never used  Substance Use Topics  . Alcohol use: Not Currently    Comment: occasionally   . Drug use: No    Allergies: No Known Allergies  Medications Prior to Admission  Medication Sig Dispense Refill Last Dose  . aspirin EC 81 MG tablet Take 1 tablet (81 mg total) by mouth daily. 30 tablet 11 07/14/2020 at Unknown time  . Prenatal Vit-Fe Fumarate-FA (PRENATAL COMPLETE) 14-0.4 MG TABS Take 1 tablet by mouth daily. 60 tablet 11 07/15/2020 at Unknown time  . Biotin 1000 MCG tablet Take 1,000 mcg by mouth 3 (three) times daily. (Patient not taking: Reported on 06/12/2020)     . Blood Pressure Monitoring (BLOOD PRESSURE KIT) DEVI 1 Device by Does not apply route as needed. 1 each 0   . naproxen (NAPROSYN) 500 MG tablet Take 1 tablet (500 mg total) by mouth 2 (two) times daily. (Patient not taking: Reported on 04/25/2020) 30 tablet 0   . Prenatal Vit-Fe Fumarate-FA (PRENATAL MULTIVITAMIN) TABS tablet Take 1 tablet by mouth daily at 12 noon. (Patient not taking: Reported on 06/18/2020)     .  tiZANidine (ZANAFLEX) 4 MG tablet Take 1 tablet (4 mg total) by mouth every 6 (six) hours as needed for muscle spasms. (Patient not taking: Reported on 04/25/2020) 30 tablet 0     Review of Systems  Constitutional: Negative.   Respiratory: Negative.   Cardiovascular: Negative.   Gastrointestinal: Positive for abdominal pain and nausea. Negative for constipation, diarrhea and vomiting.  Genitourinary: Positive for vaginal bleeding. Negative for difficulty urinating, dysuria, genital sores, hematuria, pelvic pain and urgency.  Musculoskeletal: Positive for back pain.  Neurological: Positive for headaches. Negative for dizziness and light-headedness.  Psychiatric/Behavioral: Negative.    Physical Exam   Blood pressure 111/74, pulse 69, temperature 98.6 F (37 C), temperature source Oral, resp.  rate 20, height _0  (1.626 m), weight 94.8 kg, last menstrual period 02/01/2020.  Physical Exam Vitals reviewed. Exam conducted with a chaperone present.  HENT:     Head: Normocephalic.  Cardiovascular:     Rate and Rhythm: Normal rate and regular rhythm.  Pulmonary:     Effort: Pulmonary effort is normal. No respiratory distress.     Breath sounds: Normal breath sounds. No wheezing.  Abdominal:     Palpations: Abdomen is soft. There is no mass.     Tenderness: There is no abdominal tenderness. There is no guarding.     Comments: Gravid appropriate for gestational age  Genitourinary:    Exam position: Lithotomy position.     Vagina: Vaginal discharge present. No bleeding.     Comments: Pelvic exam: Cervix pink, visually open, without lesion, small amount of white creamy discharge, vaginal walls and external genitalia normal, no vaginal bleeding present.  Musculoskeletal:     Right lower leg: No edema.     Left lower leg: No edema.  Skin:    General: Skin is warm and dry.  Neurological:     Mental Status: She is alert and oriented to person, place, and time.  Psychiatric:        Mood and Affect: Mood normal.        Behavior: Behavior normal.        Thought Content: Thought content normal.    Dilation: Fingertip Effacement (%): 40 Cervical Position: Middle Exam by:: V Zarriah Starkel CNM  Fetal monitoring: 145/moderate/+accels/ no decelerations, no UC  MAU Course  Procedures  MDM Orders Placed This Encounter  Procedures  . Wet prep, genital  . [place] MFM OB TRANSVAGINAL  . Fetal fibronectin   Meds ordered this encounter  Medications  . metoCLOPramide (REGLAN) tablet 10 mg  . cyclobenzaprine (FLEXERIL) tablet 10 mg  . diphenhydrAMINE (BENADRYL) capsule 25 mg  . cyclobenzaprine (FLEXERIL) 10 MG tablet    Sig: Take 1 tablet (10 mg total) by mouth 2 (two) times daily as needed for muscle spasms.    Dispense:  20 tablet    Refill:  0    Order Specific Question:   Supervising  Provider    Answer:   Truett Mainland [[id number]]  . metroNIDAZOLE (FLAGYL) 500 MG tablet    Sig: Take 1 tablet (500 mg total) by mouth 2 (two) times daily.    Dispense:  14 tablet    Refill:  0    Order Specific Question:   Supervising Provider    Answer:   Truett Mainland [[id number]]  . ondansetron (ZOFRAN ODT) 4 MG disintegrating tablet    Sig: Take 1 tablet (4 mg total) by mouth every 8 (eight) hours as needed for nausea or vomiting.    Dispense:  30 tablet    Refill:  2    Order Specific Question:   Supervising Provider    Answer:   Truett Mainland [[id number]]  . Elastic Bandages & Supports (COMFORT FIT MATERNITY SUPP LG) MISC    Sig: 1 Units by Does not apply route daily.    Dispense:  1 each    Refill:  0    Order Specific Question:   Supervising Provider    Answer:   Truett Mainland [[id number]]   Treatment plan MAU included oral headache cocktail and oral Flexeril FFN collected prior to speculum examination  Transvaginal ultrasound ordered to assess cervical length due to 40% effaced on cervical examination and abdominal cramping  Labs and ultrasound report reviewed: Results for orders placed or performed during the hospital encounter of 07/15/20 (from the past 24 hour(s))  Wet prep, genital     Status: Abnormal   Collection Time: 07/15/20 10:14 PM   Specimen: PATH Cytology Cervicovaginal Ancillary Only  Result Value Ref Range   Yeast Wet Prep HPF POC NONE SEEN NONE SEEN   Trich, Wet Prep NONE SEEN NONE SEEN   Clue Cells Wet Prep HPF POC PRESENT (A) NONE SEEN   WBC, Wet Prep HPF POC MANY (A) NONE SEEN   Sperm NONE SEEN   Fetal fibronectin     Status: None   Collection Time: 07/15/20 10:14 PM  Result Value Ref Range   Fetal Fibronectin NEGATIVE NEGATIVE   Ultrasound noted posterior placenta, transverse presentation, FHR 145, AFV WNL, CL 4.88 cm.   Discussed results of labs and ultrasound with patient.  Reassessment of patient's pain after treatment in MAU.  Patient reports headache  is resolved.  Nausea is resolved.  Abdominal pain and back pain is now 1 out of 10. Discussed with patient use of maternity support belt for abdominal pain and back pain, in addition to Flexeril as needed.  Will treat for bacterial vaginosis based on pelvic examination and vaginal spotting earlier today.  Rx for Flagyl sent to pharmacy of choice.  Patient currently does not have medication for nausea at home.  Offered patient medication prescription for home use, patient agrees.  Rx for Zofran sent to pharmacy of choice.  Discussed reasons to return to MAU. Follow up as scheduled in the office. Return to MAU as needed. Pt stable at time of discharge.   Assessment and Plan   1. Headache in pregnancy, antepartum, second trimester   2. Fetal cardiac echogenic focus, antepartum, fetus 1   3. Supervision of high risk pregnancy, antepartum   4. Obesity affecting pregnancy in second trimester   5. History of C-section   6. Encounter for antenatal screening for cervical length   7. NST (non-stress test) reactive   8. Abdominal pain during pregnancy in second trimester   9. Back pain affecting pregnancy in second trimester   10. Bacterial vaginosis    Discharge home Follow up as scheduled in the office for prenatal care Return to MAU as needed for reasons discussed and/or emergencies  Rx for Zofran, Flagyl, and Flexeril sent to pharmacy of choice Rx for maternity support belt given to patient   [place] [facility] [place] [facility]. Schedule an appointment as soon as possible for a visit.   Specialty: Obstetrics and Gynecology Why: make next prenatal appointment  Contact information: [place] [address] [phone]  Allergies as of 07/15/2020   No Known Allergies     Medication List    STOP taking these medications   naproxen 500 MG tablet Commonly known as: NAPROSYN     TAKE these  medications   aspirin EC 81 MG tablet Take 1 tablet (81 mg total) by mouth daily.   Biotin 1000 MCG tablet Take 1,000 mcg by mouth 3 (three) times daily.   Blood Pressure Kit Devi 1 Device by Does not apply route as needed.   Comfort Fit Maternity Supp Lg Misc 1 Units by Does not apply route daily.   cyclobenzaprine 10 MG tablet Commonly known as: FLEXERIL Take 1 tablet (10 mg total) by mouth 2 (two) times daily as needed for muscle spasms.   metroNIDAZOLE 500 MG tablet Commonly known as: FLAGYL Take 1 tablet (500 mg total) by mouth 2 (two) times daily.   ondansetron 4 MG disintegrating tablet Commonly known as: Zofran ODT Take 1 tablet (4 mg total) by mouth every 8 (eight) hours as needed for nausea or vomiting.   Prenatal Complete 14-0.4 MG Tabs Take 1 tablet by mouth daily.   prenatal multivitamin Tabs tablet Take 1 tablet by mouth daily at 12 noon.   tiZANidine 4 MG tablet Commonly known as: Zanaflex Take 1 tablet (4 mg total) by mouth every 6 (six) hours as needed for muscle spasms.       Lajean Manes CNM 07/15/2020, 11:36 PM

## 2020-07-16 LAB — GC/CHLAMYDIA PROBE AMP (~~LOC~~) NOT AT ARMC
Chlamydia: NEGATIVE
Comment: NEGATIVE
Comment: NORMAL
Neisseria Gonorrhea: NEGATIVE

## 2020-08-07 ENCOUNTER — Ambulatory Visit (INDEPENDENT_AMBULATORY_CARE_PROVIDER_SITE_OTHER): Payer: Medicaid Other

## 2020-08-07 ENCOUNTER — Other Ambulatory Visit: Payer: Self-pay

## 2020-08-07 VITALS — BP 103/71 | HR 89 | Wt 207.0 lb

## 2020-08-07 DIAGNOSIS — Z98891 History of uterine scar from previous surgery: Secondary | ICD-10-CM

## 2020-08-07 DIAGNOSIS — Z3A26 26 weeks gestation of pregnancy: Secondary | ICD-10-CM

## 2020-08-07 DIAGNOSIS — O99212 Obesity complicating pregnancy, second trimester: Secondary | ICD-10-CM

## 2020-08-07 DIAGNOSIS — Z3402 Encounter for supervision of normal first pregnancy, second trimester: Secondary | ICD-10-CM

## 2020-08-07 NOTE — Patient Instructions (Signed)
Glucose Tolerance Test During Pregnancy Why am I having this test? The glucose tolerance test (GTT) is done to check how your body processes sugar (glucose). This is one of several tests used to diagnose diabetes that develops during pregnancy (gestational diabetes mellitus). Gestational diabetes is a temporary form of diabetes that some women develop during pregnancy. It usually occurs during the second trimester of pregnancy and goes away after delivery. Testing (screening) for gestational diabetes usually occurs between 24 and 28 weeks of pregnancy. You may have the GTT test after having a 1-hour glucose screening test if the results from that test indicate that you may have gestational diabetes. You may also have this test if:  You have a history of gestational diabetes.  You have a history of giving birth to very large babies or have experienced repeated fetal loss (stillbirth).  You have signs and symptoms of diabetes, such as: ? Changes in your vision. ? Tingling or numbness in your hands or feet. ? Changes in hunger, thirst, and urination that are not otherwise explained by your pregnancy. What is being tested? This test measures the amount of glucose in your blood at different times during a period of 3 hours. This indicates how well your body is able to process glucose. What kind of sample is taken?  Blood samples are required for this test. They are usually collected by inserting a needle into a blood vessel. How do I prepare for this test?  For 3 days before your test, eat normally. Have plenty of carbohydrate-rich foods.  Follow instructions from your health care provider about: ? Eating or drinking restrictions on the day of the test. You may be asked to not eat or drink anything other than water (fast) starting 8-10 hours before the test. ? Changing or stopping your regular medicines. Some medicines may interfere with this test. Tell a health care provider about:  All  medicines you are taking, including vitamins, herbs, eye drops, creams, and over-the-counter medicines.  Any blood disorders you have.  Any surgeries you have had.  Any medical conditions you have. What happens during the test? First, your blood glucose will be measured. This is referred to as your fasting blood glucose, since you fasted before the test. Then, you will drink a glucose solution that contains a certain amount of glucose. Your blood glucose will be measured again 1, 2, and 3 hours after drinking the solution. This test takes about 3 hours to complete. You will need to stay at the testing location during this time. During the testing period:  Do not eat or drink anything other than the glucose solution.  Do not exercise.  Do not use any products that contain nicotine or tobacco, such as cigarettes and e-cigarettes. If you need help stopping, ask your health care provider. The testing procedure may vary among health care providers and hospitals. How are the results reported? Your results will be reported as milligrams of glucose per deciliter of blood (mg/dL) or millimoles per liter (mmol/L). Your health care provider will compare your results to normal ranges that were established after testing a large group of people (reference ranges). Reference ranges may vary among labs and hospitals. For this test, common reference ranges are:  Fasting: less than 95-105 mg/dL (5.3-5.8 mmol/L).  1 hour after drinking glucose: less than 180-190 mg/dL (10.0-10.5 mmol/L).  2 hours after drinking glucose: less than 155-165 mg/dL (8.6-9.2 mmol/L).  3 hours after drinking glucose: 140-145 mg/dL (7.8-8.1 mmol/L). What do the   results mean? Results within reference ranges are considered normal, meaning that your glucose levels are well-controlled. If two or more of your blood glucose levels are high, you may be diagnosed with gestational diabetes. If only one level is high, your health care  provider may suggest repeat testing or other tests to confirm a diagnosis. Talk with your health care provider about what your results mean. Questions to ask your health care provider Ask your health care provider, or the department that is doing the test:  When will my results be ready?  How will I get my results?  What are my treatment options?  What other tests do I need?  What are my next steps? Summary  The glucose tolerance test (GTT) is one of several tests used to diagnose diabetes that develops during pregnancy (gestational diabetes mellitus). Gestational diabetes is a temporary form of diabetes that some women develop during pregnancy.  You may have the GTT test after having a 1-hour glucose screening test if the results from that test indicate that you may have gestational diabetes. You may also have this test if you have any symptoms or risk factors for gestational diabetes.  Talk with your health care provider about what your results mean. This information is not intended to replace advice given to you by your health care provider. Make sure you discuss any questions you have with your health care provider. Document Revised: 03/22/2019 Document Reviewed: 07/11/2017 Elsevier Patient Education  2020 Elsevier Inc.  

## 2020-08-07 NOTE — Progress Notes (Signed)
HIGH-RISK PREGNANCY OFFICE VISIT  Patient name: Jennifer Lynn MRN [id number]  Date of birth: 05/22/1996 Chief Complaint:   Routine Prenatal Visit  Subjective:   AHMIRA BOISSELLE is a 24 y.o. G24P1001 female at [redacted]w[redacted]d with an Estimated Date of Delivery: 11/07/20 being seen today for ongoing management of a high-risk pregnancy aeb has Morbid obesity with BMI of 40.0-44.9, adult (HCC); Alpha thalassemia trait; History of pre-eclampsia; Supervision of high risk pregnancy, antepartum; Obesity affecting pregnancy; History of C-section; and Fetal cardiac echogenic focus, antepartum, fetus 1 on their problem list.  Patient presents today with complaint of vaginal pressure.  Patient states it feels like the baby "want's to come out vaginally."  Patient goes on to state that because of this feeling she desires a repeat c/s. Patient endorses fetal movement and denies vaginal concerns including abnormal discharge, leaking of fluid, and bleeding.  Contractions: Not present. Vag. Bleeding: None.  Movement: Present.  Reviewed past medical,surgical, social, obstetrical and family history as well as problem list, medications and allergies.  Objective   Vitals:   08/07/20 1123  BP: 103/71  Pulse: 89  Weight: 207 lb (93.9 kg)  Body mass index is 35.53 kg/m.  Total Weight Gain:-3 lb (-1.361 kg)         Physical Examination:   General appearance: Well appearing, and in no distress  Mental status: Alert, oriented to person, place, and time  Skin: Warm & dry  Cardiovascular: Normal heart rate noted  Respiratory: Normal respiratory effort, no distress  Abdomen: Soft, gravid, nontender, AGA with Fundal height of Fundal Height: 27 cm  Pelvic: Cervical exam deferred           Extremities: Edema: None  Fetal Status: Fetal Heart Rate (bpm): 160  Movement: Present   No results found for this or any previous visit (from the past 24 hour(s)).  Assessment & Plan:  Low-risk pregnancy of a 24 y.o.,  G2P1001 at [redacted]w[redacted]d with an Estimated Date of Delivery: 11/07/20   1. Encounter for supervision of normal first pregnancy in second trimester -Discussed need for GTT by next week as a lab only visit.  -Reviewed Glucola appt preparation including fasting the night before and morning of.   -Discussed anticipated office time of 2.5-3 hours.  -Reviewed blood draw procedures and labs which also include check of iron level.  -Discussed how results of GTT are handled including diabetic education and BS testing for abnormal results and routine care for normal results.   2. History of C-section -Patient initially desiring a repeaet, but now questions TOLAC. -Informed that she can be scheduled with MD for discussion and signing of consent. -Patient informed that just because she desires TOLAC does not mean she can not get a C/S later if she again changes her mind. -Patient expresses desire to have Dr. Mitzi Hansen perform C/S if she needs/desires repeat.   3. Obesity affecting pregnancy in second trimester -Taking baby aspirin. -Needs follow up US, per Dr. Grace Bushy, for growth at 32 weeks. -Order placed.   4. [redacted] weeks gestation of pregnancy -Discussed Tdap. -Patient states she will receive at or after GTT visit.    Meds: No orders of the defined types were placed in this encounter.  Labs/procedures today:  Lab Orders  No laboratory test(s) ordered today     Reviewed: Preterm labor symptoms and general obstetric precautions including but not limited to vaginal bleeding, contractions, leaking of fluid and fetal movement were reviewed in detail with the patient.  All questions were answered.  Follow-up: Return in about 1 week (around 08/14/2020) for Lab Visit for GTT and Tdap. .  Orders Placed This Encounter  Procedures  . [place] MFM OB FOLLOW UP  . Tdap vaccine greater than or equal to 7yo IM   Cherre Robins MSN, CNM 08/07/2020

## 2020-08-11 ENCOUNTER — Encounter: Payer: Self-pay | Admitting: Medical

## 2020-08-14 ENCOUNTER — Other Ambulatory Visit: Payer: Self-pay

## 2020-08-14 ENCOUNTER — Other Ambulatory Visit: Payer: Medicaid Other

## 2020-08-14 ENCOUNTER — Encounter: Payer: Self-pay | Admitting: Medical

## 2020-08-14 DIAGNOSIS — O0990 Supervision of high risk pregnancy, unspecified, unspecified trimester: Secondary | ICD-10-CM | POA: Diagnosis not present

## 2020-08-15 ENCOUNTER — Other Ambulatory Visit: Payer: Medicaid Other

## 2020-08-15 ENCOUNTER — Other Ambulatory Visit: Payer: Self-pay | Admitting: Obstetrics and Gynecology

## 2020-08-15 LAB — RPR: RPR Ser Ql: NONREACTIVE

## 2020-08-15 LAB — CBC
Hematocrit: 28.5 % — ABNORMAL LOW (ref 34.0–46.6)
Hemoglobin: 9.5 g/dL — ABNORMAL LOW (ref 11.1–15.9)
MCH: 27.2 pg (ref 26.6–33.0)
MCHC: 33.3 g/dL (ref 31.5–35.7)
MCV: 82 fL (ref 79–97)
Platelets: 227 10*3/uL (ref 150–450)
RBC: 3.49 x10E6/uL — ABNORMAL LOW (ref 3.77–5.28)
RDW: 13.5 % (ref 11.7–15.4)
WBC: 6.6 10*3/uL (ref 3.4–10.8)

## 2020-08-15 LAB — GLUCOSE TOLERANCE, 2 HOURS W/ 1HR
Glucose, 1 hour: 155 mg/dL (ref 65–179)
Glucose, 2 hour: 109 mg/dL (ref 65–152)
Glucose, Fasting: 75 mg/dL (ref 65–91)

## 2020-08-15 LAB — HIV ANTIBODY (ROUTINE TESTING W REFLEX): HIV Screen 4th Generation wRfx: NONREACTIVE

## 2020-08-15 MED ORDER — FERROUS SULFATE 325 (65 FE) MG PO TABS: 325.0000 mg | ORAL_TABLET | Freq: Two times a day (BID) | ORAL | 1 refills | Status: DC

## 2020-08-28 ENCOUNTER — Ambulatory Visit (INDEPENDENT_AMBULATORY_CARE_PROVIDER_SITE_OTHER): Payer: Medicaid Other | Admitting: Obstetrics and Gynecology

## 2020-08-28 ENCOUNTER — Ambulatory Visit: Payer: Medicaid Other | Admitting: *Deleted

## 2020-08-28 ENCOUNTER — Encounter: Payer: Self-pay | Admitting: Obstetrics and Gynecology

## 2020-08-28 ENCOUNTER — Other Ambulatory Visit: Payer: Self-pay

## 2020-08-28 ENCOUNTER — Ambulatory Visit: Payer: Medicaid Other

## 2020-08-28 VITALS — BP 120/82 | HR 96 | Wt 210.0 lb

## 2020-08-28 DIAGNOSIS — O99212 Obesity complicating pregnancy, second trimester: Secondary | ICD-10-CM

## 2020-08-28 DIAGNOSIS — O0990 Supervision of high risk pregnancy, unspecified, unspecified trimester: Secondary | ICD-10-CM | POA: Insufficient documentation

## 2020-08-28 DIAGNOSIS — Z8759 Personal history of other complications of pregnancy, childbirth and the puerperium: Secondary | ICD-10-CM

## 2020-08-28 DIAGNOSIS — O99213 Obesity complicating pregnancy, third trimester: Secondary | ICD-10-CM

## 2020-08-28 DIAGNOSIS — Z98891 History of uterine scar from previous surgery: Secondary | ICD-10-CM

## 2020-08-28 DIAGNOSIS — O35BXX1 Maternal care for other (suspected) fetal abnormality and damage, fetal cardiac anomalies, fetus 1: Secondary | ICD-10-CM

## 2020-08-28 DIAGNOSIS — O358XX1 Maternal care for other (suspected) fetal abnormality and damage, fetus 1: Secondary | ICD-10-CM | POA: Diagnosis not present

## 2020-08-28 DIAGNOSIS — Z3402 Encounter for supervision of normal first pregnancy, second trimester: Secondary | ICD-10-CM

## 2020-08-28 NOTE — Progress Notes (Signed)
   PRENATAL VISIT NOTE  Subjective:  Jennifer Lynn is a 24 y.o. G2P1001 at [redacted]w[redacted]d being seen today for ongoing prenatal care.  She is currently monitored for the following issues for this high-risk pregnancy and has Morbid obesity with BMI of 40.0-44.9, adult (HCC); Alpha thalassemia trait; History of pre-eclampsia; Supervision of high risk pregnancy, antepartum; Obesity affecting pregnancy; History of C-section; and Fetal cardiac echogenic focus, antepartum, fetus 1 on their problem list.  Patient reports no complaints.  Contractions: Not present. Vag. Bleeding: None.  Movement: Present. Denies leaking of fluid.   The following portions of the patient's history were reviewed and updated as appropriate: allergies, current medications, past family history, past medical history, past social history, past surgical history and problem list.   Objective:   Vitals:   08/28/20 1530  BP: 120/82  Pulse: 96  Weight: 210 lb (95.3 kg)    Fetal Status: Fetal Heart Rate (bpm): 160 Fundal Height: 30 cm Movement: Present     General:  Alert, oriented and cooperative. Patient is in no acute distress.  Skin: Skin is warm and dry. No rash noted.   Cardiovascular: Normal heart rate noted  Respiratory: Normal respiratory effort, no problems with respiration noted  Abdomen: Soft, gravid, appropriate for gestational age.  Pain/Pressure: Absent     Pelvic: Cervical exam deferred        Extremities: Normal range of motion.  Edema: None  Mental Status: Normal mood and affect. Normal behavior. Normal judgment and thought content.   Assessment and Plan:  Pregnancy: G2P1001 at [redacted]w[redacted]d 1. Supervision of high risk pregnancy, antepartum Patient is doing well  2. History of C-section Discussed risks benefits of RCS vs TOLAC- Patient opted for TOLAC. Consent signed  3. Obesity affecting pregnancy in third trimester Continue ASA  Follow up growth ultrasound ordered   4. History of  pre-eclampsia   Preterm labor symptoms and general obstetric precautions including but not limited to vaginal bleeding, contractions, leaking of fluid and fetal movement were reviewed in detail with the patient. Please refer to After Visit Summary for other counseling recommendations.   Return in about 2 weeks (around 09/11/2020) for in person, ROB, High risk.  No future appointments.  Catalina Antigua, MD

## 2020-08-28 NOTE — Progress Notes (Signed)
Called patient and left a message regarding incomplete TOLAC form. Requested that patient returns to office to complete form.  Ladona Ridgel, RN

## 2020-09-10 ENCOUNTER — Ambulatory Visit (INDEPENDENT_AMBULATORY_CARE_PROVIDER_SITE_OTHER): Payer: Medicaid Other | Admitting: Obstetrics & Gynecology

## 2020-09-10 ENCOUNTER — Other Ambulatory Visit: Payer: Self-pay

## 2020-09-10 ENCOUNTER — Encounter: Payer: Self-pay | Admitting: Obstetrics & Gynecology

## 2020-09-10 VITALS — BP 106/75 | HR 109 | Wt 210.3 lb

## 2020-09-10 DIAGNOSIS — Z98891 History of uterine scar from previous surgery: Secondary | ICD-10-CM

## 2020-09-10 DIAGNOSIS — O0990 Supervision of high risk pregnancy, unspecified, unspecified trimester: Secondary | ICD-10-CM

## 2020-09-10 DIAGNOSIS — Z8759 Personal history of other complications of pregnancy, childbirth and the puerperium: Secondary | ICD-10-CM

## 2020-09-10 DIAGNOSIS — R000 Tachycardia, unspecified: Secondary | ICD-10-CM

## 2020-09-10 DIAGNOSIS — Z6841 Body Mass Index (BMI) 40.0 and over, adult: Secondary | ICD-10-CM

## 2020-09-10 NOTE — Progress Notes (Signed)
   PRENATAL VISIT NOTE  Subjective:  Jennifer Lynn is a 24 y.o. G2P1001 at [redacted]w[redacted]d being seen today for ongoing prenatal care.  She is currently monitored for the following issues for this high-risk pregnancy and has Morbid obesity with BMI of 40.0-44.9, adult (HCC); Alpha thalassemia trait; History of pre-eclampsia; Supervision of high risk pregnancy, antepartum; Obesity affecting pregnancy; History of C-section; and Fetal cardiac echogenic focus, antepartum, fetus 1 on their problem list.  Patient reports several episodes of tachycardia and weakness lasting up to 30 min. HR up to 120, BP nl.  Contractions: Not present. Vag. Bleeding: None.  Movement: Present. Denies leaking of fluid.   The following portions of the patient's history were reviewed and updated as appropriate: allergies, current medications, past family history, past medical history, past social history, past surgical history and problem list.   Objective:   Vitals:   09/10/20 1629  BP: 106/75  Pulse: (!) 109  Weight: 210 lb 4.8 oz (95.4 kg)    Fetal Status: Fetal Heart Rate (bpm): 144   Movement: Present     General:  Alert, oriented and cooperative. Patient is in no acute distress.  Skin: Skin is warm and dry. No rash noted.   Cardiovascular: Normal heart rate noted  Respiratory: Normal respiratory effort, no problems with respiration noted  Abdomen: Soft, gravid, appropriate for gestational age.  Pain/Pressure: Absent     Pelvic: Cervical exam deferred        Extremities: Normal range of motion.  Edema: None  Mental Status: Normal mood and affect. Normal behavior. Normal judgment and thought content.   Assessment and Plan:  Pregnancy: G2P1001 at [redacted]w[redacted]d 1. Supervision of high risk pregnancy, antepartum New onset of tachycardia, cardiology f/u referred  2. Morbid obesity with BMI of 40.0-44.9, adult (HCC)   3. History of pre-eclampsia Normal BP  4. History of C-section  desires repeat  5.  Tachycardia  - Ambulatory referral to Cardiology  Preterm labor symptoms and general obstetric precautions including but not limited to vaginal bleeding, contractions, leaking of fluid and fetal movement were reviewed in detail with the patient. Please refer to After Visit Summary for other counseling recommendations.   Return in about 2 weeks (around 09/24/2020).  Future Appointments  Date Time Provider Department Center  09/15/2020  2:45 PM [facility]5 [facility]    Scheryl Darter, MD

## 2020-09-10 NOTE — Patient Instructions (Signed)

## 2020-09-15 ENCOUNTER — Ambulatory Visit: Payer: Medicaid Other

## 2020-09-26 ENCOUNTER — Other Ambulatory Visit: Payer: Self-pay

## 2020-09-26 ENCOUNTER — Encounter: Payer: Self-pay | Admitting: *Deleted

## 2020-09-26 ENCOUNTER — Ambulatory Visit: Payer: Medicaid Other | Attending: Obstetrics and Gynecology

## 2020-09-26 ENCOUNTER — Ambulatory Visit: Payer: Medicaid Other | Admitting: *Deleted

## 2020-09-26 DIAGNOSIS — Z98891 History of uterine scar from previous surgery: Secondary | ICD-10-CM | POA: Diagnosis not present

## 2020-09-26 DIAGNOSIS — O358XX1 Maternal care for other (suspected) fetal abnormality and damage, fetus 1: Secondary | ICD-10-CM

## 2020-09-26 DIAGNOSIS — O35BXX1 Maternal care for other (suspected) fetal abnormality and damage, fetal cardiac anomalies, fetus 1: Secondary | ICD-10-CM

## 2020-09-26 DIAGNOSIS — O99213 Obesity complicating pregnancy, third trimester: Secondary | ICD-10-CM

## 2020-09-26 DIAGNOSIS — O0990 Supervision of high risk pregnancy, unspecified, unspecified trimester: Secondary | ICD-10-CM

## 2020-09-29 ENCOUNTER — Other Ambulatory Visit: Payer: Self-pay | Admitting: *Deleted

## 2020-09-29 DIAGNOSIS — Z3689 Encounter for other specified antenatal screening: Secondary | ICD-10-CM

## 2020-10-01 ENCOUNTER — Other Ambulatory Visit: Payer: Self-pay

## 2020-10-01 ENCOUNTER — Ambulatory Visit (INDEPENDENT_AMBULATORY_CARE_PROVIDER_SITE_OTHER): Payer: Medicaid Other | Admitting: Obstetrics & Gynecology

## 2020-10-01 ENCOUNTER — Encounter: Payer: Self-pay | Admitting: Obstetrics & Gynecology

## 2020-10-01 VITALS — BP 124/87 | HR 95 | Wt 217.6 lb

## 2020-10-01 DIAGNOSIS — O99213 Obesity complicating pregnancy, third trimester: Secondary | ICD-10-CM

## 2020-10-01 DIAGNOSIS — O0990 Supervision of high risk pregnancy, unspecified, unspecified trimester: Secondary | ICD-10-CM

## 2020-10-01 DIAGNOSIS — Z98891 History of uterine scar from previous surgery: Secondary | ICD-10-CM

## 2020-10-01 DIAGNOSIS — Z8759 Personal history of other complications of pregnancy, childbirth and the puerperium: Secondary | ICD-10-CM

## 2020-10-01 NOTE — Progress Notes (Signed)
   PRENATAL VISIT NOTE  Subjective:  Jennifer Lynn is a 24 y.o. G2P1001 at [redacted]w[redacted]d being seen today for ongoing prenatal care.  She is currently monitored for the following issues for this high-risk pregnancy and has Morbid obesity with BMI of 40.0-44.9, adult (HCC); Alpha thalassemia trait; History of pre-eclampsia; Supervision of high risk pregnancy, antepartum; Obesity affecting pregnancy; History of C-section; and Fetal cardiac echogenic focus, antepartum, fetus 1 on their problem list.  Patient reports occasional contractions.  Contractions: Irritability. Vag. Bleeding: None.  Movement: Present. Denies leaking of fluid.   The following portions of the patient's history were reviewed and updated as appropriate: allergies, current medications, past family history, past medical history, past social history, past surgical history and problem list.   Objective:   Vitals:   10/01/20 1538  BP: 124/87  Pulse: 95  Weight: 217 lb 9.6 oz (98.7 kg)    Fetal Status: Fetal Heart Rate (bpm): 147   Movement: Present  Presentation: Vertex  General:  Alert, oriented and cooperative. Patient is in no acute distress.  Skin: Skin is warm and dry. No rash noted.   Cardiovascular: Normal heart rate noted  Respiratory: Normal respiratory effort, no problems with respiration noted  Abdomen: Soft, gravid, appropriate for gestational age.  Pain/Pressure: Present     Pelvic: Cervical exam performed in the presence of a chaperone Dilation: Fingertip Effacement (%): 20 Station: Ballotable  Extremities: Normal range of motion.  Edema: None  Mental Status: Normal mood and affect. Normal behavior. Normal judgment and thought content.   Assessment and Plan:  Pregnancy: G2P1001 at [redacted]w[redacted]d There are no diagnoses linked to this encounter. Preterm labor symptoms and general obstetric precautions including but not limited to vaginal bleeding, contractions, leaking of fluid and fetal movement were reviewed in detail  with the patient. Please refer to After Visit Summary for other counseling recommendations.  History of C-section  Supervision of high risk pregnancy, antepartum  Obesity affecting pregnancy in third trimester  History of pre-eclampsia Undecided about TOLAC, will have [place] in 3 weeks for EFW  Return in about 2 weeks (around 10/15/2020).  Future Appointments  Date Time Provider Department Center  10/07/2020  3:20 PM Meriam Sprague, MD [facility]  10/23/2020  3:30 PM [facility] NURSE [facility]  10/23/2020  3:45 PM [facility] US5 [facility]    Scheryl Darter, MD

## 2020-10-01 NOTE — Patient Instructions (Signed)
Vaginal Birth After Cesarean Delivery  Vaginal birth after cesarean delivery (VBAC) is giving birth vaginally after previously delivering a baby through a cesarean section (C-section). A VBAC may be a safe option for you, depending on your health and other factors. It is important to discuss VBAC with your health care provider early in your pregnancy so you can understand the risks, benefits, and options. Having these discussions early will give you time to make your birth plan. Who are the best candidates for VBAC? The best candidates for VBAC are women who:  Have had one or two prior cesarean deliveries, and the incision made during the delivery was horizontal (low transverse).  Do not have a vertical (classical) scar on their uterus.  Have not had a tear in the wall of their uterus (uterine rupture).  Plan to have more pregnancies. A VBAC is also more likely to be successful:  In women who have previously given birth vaginally.  When labor starts by itself (spontaneously) before the due date. What are the benefits of VBAC? The benefits of delivering your baby vaginally instead of by a cesarean delivery include:  A shorter hospital stay.  A faster recovery time.  Less pain.  Avoiding risks associated with major surgery, such as infection and blood clots.  Less blood loss and less need for donated blood (transfusions). What are the risks of VBAC? The main risk of attempting a VBAC is that it may fail, forcing your health care provider to deliver your baby by a C-section. Other risks are rare and include:  Tearing (rupture) of the scar from a past cesarean delivery.  Other risks associated with vaginal deliveries. If a repeat cesarean delivery is needed, the risks include:  Blood loss.  Infection.  Blood clot.  Damage to surrounding organs.  Removal of the uterus (hysterectomy), if it is damaged.  Placenta problems in future pregnancies. What else should I know  about my options? Delivering a baby through a VBAC is similar to having a normal spontaneous vaginal delivery. Therefore, it is safe:  To try with twins.  For your health care provider to try to turn the baby from a breech position (external cephalic version) during labor.  With epidural analgesia for pain relief. Consider where you would like to deliver your baby. VBAC should be attempted in facilities where an emergency cesarean delivery can be performed. VBAC is not recommended for home births. Any changes in your health or your baby's health during your pregnancy may make it necessary to change your initial decision about VBAC. Your health care provider may recommend that you do not attempt a VBAC if:  Your baby's suspected weight is 8.8 lb (4 kg) or more.  You have preeclampsia. This is a condition that causes high blood pressure along with other symptoms, such as swelling and headaches.  You will have VBAC less than 19 months after your cesarean delivery.  You are past your due date.  You need to have labor started (induced) because your cervix is not ready for labor (unfavorable). Where to find more information  American Pregnancy Association: americanpregnancy.org  American Congress of Obstetricians and Gynecologists: acog.org Summary  Vaginal birth after cesarean delivery (VBAC) is giving birth vaginally after previously delivering a baby through a cesarean section (C-section). A VBAC may be a safe option for you, depending on your health and other factors.  Discuss VBAC with your health care provider early in your pregnancy so you can understand the risks, benefits, options, and   have plenty of time to make your birth plan.  The main risk of attempting a VBAC is that it may fail, forcing your health care provider to deliver your baby by a C-section. Other risks are rare. This information is not intended to replace advice given to you by your health care provider. Make sure  you discuss any questions you have with your health care provider. Document Revised: 03/27/2019 Document Reviewed: 03/08/2017 Elsevier Patient Education  2020 Elsevier Inc.  

## 2020-10-05 NOTE — Progress Notes (Signed)
Cardiology Office Note:    Date:  10/07/2020   ID:  EMERSEN MASCARI, DOB 03/29/1996, MRN [id number]  PCP:  Cain Saupe, MD  [facility] Cardiologist:  Meriam Sprague, MD  [facility] Electrophysiologist:  None   Referring MD: Adam Phenix, MD    History of Present Illness:    Jennifer Lynn is a 24 y.o. female with a hx of morbid obesity, alpha thalassemia trait, and pre-eclampsia who is currently 32w pregnant who was referred by Dr. Debroah Loop for concern for tachycardia.  Patient states that since June, she has been having episodes of a "racing heart" with associated lightheadedness and diaphoresi. Usually occurs after sitting down for a period of time and "comes out of the blue." Not related to exertion. Symptoms cccur about 1 per week; lasts 20-42minutes. Goes away with laying down, cooling off, drinking cold water and ice. Episodes did not occur prior to pregnancy. No known family history heart disease or arrhythmia. Pregnancy has otherwise been without issues. No bleeding, pre-eclampsia, HTN. Has some shortness of breath as she has become later in pregnancy but no acute worsening. Has symmetric LE edema, but no calf pain or tenderness. No alcohol or illicit drug use. No tobacco use.    Past Medical History:  Diagnosis Date  . Anemia   . Blood transfusion without reported diagnosis    patient thinks she had after previous delivery  . Medical history non-contributory   . UTI (urinary tract infection)     Past Surgical History:  Procedure Laterality Date  . CESAREAN SECTION N/A 09/16/2017   Procedure: CESAREAN SECTION;  Surgeon: Lake Norden Bing, MD;  Location: [facility] BIRTHING SUITES;  Service: Obstetrics;  Laterality: N/A;    Current Medications: Current Meds  Medication Sig  . aspirin EC 81 MG tablet Take 1 tablet (81 mg total) by mouth daily.  . Prenatal Vit-Fe Fumarate-FA (PRENATAL COMPLETE) 14-0.4 MG TABS Take 1 tablet by mouth daily.     Allergies:    Patient has no known allergies.   Social History   Socioeconomic History  . Marital status: Single    Spouse name: Not on file  . Number of children: Not on file  . Years of education: Not on file  . Highest education level: Not on file  Occupational History  . Not on file  Tobacco Use  . Smoking status: Never Smoker  . Smokeless tobacco: Never Used  Vaping Use  . Vaping Use: Never used  Substance and Sexual Activity  . Alcohol use: Not Currently    Comment: occasionally   . Drug use: No  . Sexual activity: Yes    Birth control/protection: None  Other Topics Concern  . Not on file  Social History Narrative  . Not on file   Social Determinants of Health   Financial Resource Strain:   . Difficulty of Paying Living Expenses: Not on file  Food Insecurity: No Food Insecurity  . Worried About Programme researcher, broadcasting/film/video in the Last Year: Never true  . Ran Out of Food in the Last Year: Never true  Transportation Needs: Unmet Transportation Needs  . Lack of Transportation (Medical): Yes  . Lack of Transportation (Non-Medical): No  Physical Activity:   . Days of Exercise per Week: Not on file  . Minutes of Exercise per Session: Not on file  Stress:   . Feeling of Stress : Not on file  Social Connections:   . Frequency of Communication with Friends and Family: Not  on file  . Frequency of Social Gatherings with Friends and Family: Not on file  . Attends Religious Services: Not on file  . Active Member of Clubs or Organizations: Not on file  . Attends BankerClub or Organization Meetings: Not on file  . Marital Status: Not on file     Family History: The patient's family history includes Diabetes in her maternal grandmother and paternal grandmother; Hypertension in her father and mother.  ROS:   Please see the history of present illness.    Review of Systems  Constitutional: Negative for chills, fever and weight loss.  HENT: Negative for sore throat.   Eyes: Positive for blurred  vision.  Respiratory: Negative for cough.   Cardiovascular: Positive for palpitations and leg swelling. Negative for chest pain, orthopnea, claudication and PND.  Gastrointestinal: Positive for nausea. Negative for blood in stool and vomiting.  Genitourinary: Positive for frequency. Negative for dysuria.  Musculoskeletal: Negative for falls.  Neurological: Positive for dizziness. Negative for loss of consciousness and weakness.  Psychiatric/Behavioral: Negative for depression and substance abuse.    EKGs/Labs/Other Studies Reviewed:    The following studies were reviewed today: No prior cardiac studies  EKG:  EKG is  ordered today.  The ekg ordered today demonstrates NSR with HR 86. No ischemia, ectopy, pauses or block  Recent Labs: 04/25/2020: ALT 18; BUN 7; Creatinine, Ser 0.69; Potassium 3.9; Sodium 136 08/14/2020: Hemoglobin 9.5; Platelets 227  Recent Lipid Panel No results found for: CHOL, TRIG, HDL, CHOLHDL, VLDL, LDLCALC, LDLDIRECT    Physical Exam:    VS:  BP 122/66   Pulse 86   Ht 5\' 4"  (1.626 m)   Wt 220 lb (99.8 kg)   LMP 02/01/2020 (Exact Date)   BMI 37.76 kg/m     Wt Readings from Last 3 Encounters:  10/07/20 220 lb (99.8 kg)  10/01/20 217 lb 9.6 oz (98.7 kg)  09/10/20 210 lb 4.8 oz (95.4 kg)     GEN:  Comfortable, pregnant young female HEENT: Normal NECK: No JVD; No carotid bruits CARDIAC: RRR, 2/6 systolic murmur best heard at RUSB RESPIRATORY:  Clear to auscultation without rales, wheezing or rhonchi  ABDOMEN: Gravid, soft, NTTP MUSCULOSKELETAL:  Trace pedal edema bilaterally  SKIN: Warm and dry NEUROLOGIC:  Alert and oriented x 3 PSYCHIATRIC:  Normal affect   ASSESSMENT:    1. Tachycardia   2. Lightheadedness    PLAN:    In order of problems listed above:  #Palpitations #Lightheadedness: Patient with episodes of "racing heart" and lightheadedness concerning for possible vagal episodes in the setting of IVC compression after prolonged  sitting vs SVT. No known history of arrhythmias and symptoms did not occur prior to pregnancy. Feels lightheaded but no syncope. Blood pressure at time of events (when she checked them) were in 100-110s. HR reportedly 120s but not closely monitored. Symptoms occur ~1x/wk and last about 20-2930min. Improve with cold water and ice. Denies any pregnancy complications (no HTN, DMII, or pre-eclampsia). -Check continuous zio patch x 2weeks -Discussed the need to lay on her left side with feet elevated above the head if she feels lightheaded as this will relieve compression of the IVC by the gravid uterus -Maintain adequate hydration, fluid and salt intake -Can use compression therapy to help with venous pooling as well -Pending zio patch results, can consider BB for symptomatic management  Medication Adjustments/Labs and Tests Ordered: Current medicines are reviewed at length with the patient today.  Concerns regarding medicines are outlined above.  Orders Placed This Encounter  Procedures  . LONG TERM MONITOR-LIVE TELEMETRY (3-14 DAYS)   No orders of the defined types were placed in this encounter.   Patient Instructions  Medication Instructions:  Your physician recommends that you continue on your current medications as directed. Please refer to the Current Medication list given to you today.  *If you need a refill on your cardiac medications before your next appointment, please call your pharmacy*   Testing/Procedures: Your physician has recommended that you wear an event monitor. Event monitors are medical devices that record the heart's electrical activity. Doctors most often [place] these monitors to diagnose arrhythmias. Arrhythmias are problems with the speed or rhythm of the heartbeat. The monitor is a small, portable device. You can wear one while you do your normal daily activities. This is usually used to diagnose what is causing palpitations/syncope (passing out)  Follow-Up: At [facility], you and your health needs are our priority.  As part of our continuing mission to provide you with exceptional heart care, we have created designated Provider Care Teams.  These Care Teams include your primary Cardiologist (physician) and Advanced Practice Providers (APPs -  Physician Assistants and Nurse Practitioners) who all work together to provide you with the care you need, when you need it.  Follow up with Dr. Shari Prows as needed based on results from testing.    Other Instructions ZIO XT- Long Term Monitor Instructions   Your physician has requested you wear your ZIO patch monitor__14__days.   This is a single patch monitor.  [employer] supplies one patch monitor per enrollment.  Additional stickers are not available.   Please do not apply patch if you will be having a Nuclear Stress Test, Echocardiogram, Cardiac CT, MRI, or Chest Xray during the time frame you would be wearing the monitor. The patch cannot be worn during these tests.  You cannot remove and re-apply the ZIO XT patch monitor.   Your ZIO patch monitor will be sent USPS Priority mail from [facility] directly to your home address. The monitor may also be mailed to a PO BOX if home delivery is not available.   It may take 3-5 days to receive your monitor after you have been enrolled.   Once you have received you monitor, please review enclosed instructions.  Your monitor has already been registered assigning a specific monitor serial # to you.   Applying the monitor   Shave hair from upper left chest.   Hold abrader disc by orange tab.  Rub abrader in 40 strokes over left upper chest as indicated in your monitor instructions.   Clean area with 4 enclosed alcohol pads .  Use all pads to assure are is cleaned thoroughly.  Let dry.   Apply patch as indicated in monitor instructions.  Patch will be place under collarbone on left side of chest with arrow pointing upward.   Rub patch adhesive wings for 2  minutes.Remove white label marked "1".  Remove white label marked "2".  Rub patch adhesive wings for 2 additional minutes.   While looking in a mirror, press and release button in center of patch.  A small green light will flash 3-4 times .  This will be your only indicator the monitor has been turned on.     Do not shower for the first 24 hours.  You may shower after the first 24 hours.   Press button if you feel a symptom. You will hear a small click.  Record Date, Time and Symptom in the Patient Log Book.   When you are ready to remove patch, follow instructions on last 2 pages of Patient Log Book.  Stick patch monitor onto last page of Patient Log Book.   Place Patient Log Book in Gem box.  Use locking tab on box and tape box closed securely.  The [employer] and [employer] has [employer] on it.  Please place in mailbox as soon as possible.  Your physician should have your test results approximately 7 days after the monitor has been mailed back to [facility].   Call [facility] Customer Care at [phone] if you have questions regarding your ZIO XT patch monitor.  Call them immediately if you see an orange light blinking on your monitor.   If your monitor falls off in less than 4 days contact our Monitor department at [phone].  If your monitor becomes loose or falls off after 4 days call [employer] at [phone] for suggestions on securing your monitor.      Signed, Meriam Sprague, MD  10/07/2020 5:24 PM    [facility]

## 2020-10-07 ENCOUNTER — Ambulatory Visit (INDEPENDENT_AMBULATORY_CARE_PROVIDER_SITE_OTHER): Payer: Medicaid Other | Admitting: Cardiology

## 2020-10-07 ENCOUNTER — Other Ambulatory Visit: Payer: Self-pay

## 2020-10-07 ENCOUNTER — Encounter: Payer: Self-pay | Admitting: Cardiology

## 2020-10-07 VITALS — BP 122/66 | HR 86 | Ht 64.0 in | Wt 220.0 lb

## 2020-10-07 DIAGNOSIS — R42 Dizziness and giddiness: Secondary | ICD-10-CM

## 2020-10-07 DIAGNOSIS — R000 Tachycardia, unspecified: Secondary | ICD-10-CM | POA: Diagnosis not present

## 2020-10-07 NOTE — Patient Instructions (Signed)
Medication Instructions:  Your physician recommends that you continue on your current medications as directed. Please refer to the Current Medication list given to you today.  *If you need a refill on your cardiac medications before your next appointment, please call your pharmacy*   Testing/Procedures: Your physician has recommended that you wear an event monitor. Event monitors are medical devices that record the heart's electrical activity. Doctors most often [place] these monitors to diagnose arrhythmias. Arrhythmias are problems with the speed or rhythm of the heartbeat. The monitor is a small, portable device. You can wear one while you do your normal daily activities. This is usually used to diagnose what is causing palpitations/syncope (passing out)  Follow-Up: At [facility], you and your health needs are our priority.  As part of our continuing mission to provide you with exceptional heart care, we have created designated Provider Care Teams.  These Care Teams include your primary Cardiologist (physician) and Advanced Practice Providers (APPs -  Physician Assistants and Nurse Practitioners) who all work together to provide you with the care you need, when you need it.  Follow up with Dr. Shari Prows as needed based on results from testing.    Other Instructions ZIO XT- Long Term Monitor Instructions   Your physician has requested you wear your ZIO patch monitor__14__days.   This is a single patch monitor.  [employer] supplies one patch monitor per enrollment.  Additional stickers are not available.   Please do not apply patch if you will be having a Nuclear Stress Test, Echocardiogram, Cardiac CT, MRI, or Chest Xray during the time frame you would be wearing the monitor. The patch cannot be worn during these tests.  You cannot remove and re-apply the ZIO XT patch monitor.   Your ZIO patch monitor will be sent USPS Priority mail from [facility] directly to your home address. The  monitor may also be mailed to a PO BOX if home delivery is not available.   It may take 3-5 days to receive your monitor after you have been enrolled.   Once you have received you monitor, please review enclosed instructions.  Your monitor has already been registered assigning a specific monitor serial # to you.   Applying the monitor   Shave hair from upper left chest.   Hold abrader disc by orange tab.  Rub abrader in 40 strokes over left upper chest as indicated in your monitor instructions.   Clean area with 4 enclosed alcohol pads .  Use all pads to assure are is cleaned thoroughly.  Let dry.   Apply patch as indicated in monitor instructions.  Patch will be place under collarbone on left side of chest with arrow pointing upward.   Rub patch adhesive wings for 2 minutes.Remove white label marked "1".  Remove white label marked "2".  Rub patch adhesive wings for 2 additional minutes.   While looking in a mirror, press and release button in center of patch.  A small green light will flash 3-4 times .  This will be your only indicator the monitor has been turned on.     Do not shower for the first 24 hours.  You may shower after the first 24 hours.   Press button if you feel a symptom. You will hear a small click.  Record Date, Time and Symptom in the Patient Log Book.   When you are ready to remove patch, follow instructions on last 2 pages of Patient Log Book.  Stick patch monitor  onto last page of Patient Log Book.   Place Patient Log Book in [place] box.  Use locking tab on box and tape box closed securely.  The [employer] has [employer] on it.  Please place in mailbox as soon as possible.  Your physician should have your test results approximately 7 days after the monitor has been mailed back to [facility].   Call [facility] Customer Care at [phone] if you have questions regarding your ZIO XT patch monitor.  Call them immediately if you see an orange light  blinking on your monitor.   If your monitor falls off in less than 4 days contact our Monitor department at [phone].  If your monitor becomes loose or falls off after 4 days call [employer] at [phone] for suggestions on securing your monitor.

## 2020-10-08 ENCOUNTER — Encounter: Payer: Self-pay | Admitting: *Deleted

## 2020-10-08 NOTE — Progress Notes (Signed)
Patient ID: Jennifer Lynn, female   DOB: Feb 23, 1996, 24 y.o.   MRN: [id number] Patient enrolled for [employer] to ship a 14 day ZIO XT long term holter monitor to her home.

## 2020-10-09 NOTE — Addendum Note (Signed)
Addended by: Dareen Piano on: 10/09/2020 12:33 PM   Modules accepted: Orders

## 2020-10-14 ENCOUNTER — Other Ambulatory Visit (INDEPENDENT_AMBULATORY_CARE_PROVIDER_SITE_OTHER): Payer: Medicaid Other

## 2020-10-14 DIAGNOSIS — R000 Tachycardia, unspecified: Secondary | ICD-10-CM | POA: Diagnosis not present

## 2020-10-16 ENCOUNTER — Ambulatory Visit (INDEPENDENT_AMBULATORY_CARE_PROVIDER_SITE_OTHER): Payer: Medicaid Other | Admitting: Obstetrics and Gynecology

## 2020-10-16 ENCOUNTER — Other Ambulatory Visit: Payer: Self-pay

## 2020-10-16 ENCOUNTER — Other Ambulatory Visit (HOSPITAL_COMMUNITY)
Admission: RE | Admit: 2020-10-16 | Discharge: 2020-10-16 | Disposition: A | Discharge status: Home / Self Care | Payer: Medicaid Other | Source: Ambulatory Visit | Attending: Obstetrics and Gynecology | Admitting: Obstetrics and Gynecology

## 2020-10-16 VITALS — BP 116/79 | HR 101 | Wt 226.8 lb

## 2020-10-16 DIAGNOSIS — Z8759 Personal history of other complications of pregnancy, childbirth and the puerperium: Secondary | ICD-10-CM

## 2020-10-16 DIAGNOSIS — O99213 Obesity complicating pregnancy, third trimester: Secondary | ICD-10-CM

## 2020-10-16 DIAGNOSIS — O0990 Supervision of high risk pregnancy, unspecified, unspecified trimester: Secondary | ICD-10-CM | POA: Insufficient documentation

## 2020-10-16 DIAGNOSIS — Z6841 Body Mass Index (BMI) 40.0 and over, adult: Secondary | ICD-10-CM

## 2020-10-17 LAB — GC/CHLAMYDIA PROBE AMP (~~LOC~~) NOT AT ARMC
Chlamydia: NEGATIVE
Comment: NEGATIVE
Comment: NORMAL
Neisseria Gonorrhea: NEGATIVE

## 2020-10-17 NOTE — Progress Notes (Signed)
Prenatal Visit Note Date: 10/16/2020 Clinic: [facility]  Subjective:  Jennifer Lynn is a 24 y.o. G2P1001 at [redacted]w[redacted]d being seen today for ongoing prenatal care.  She is currently monitored for the following issues for this low-risk pregnancy and has Morbid obesity with BMI of 40.0-44.9, adult (HCC); Alpha thalassemia trait; History of pre-eclampsia; Supervision of high risk pregnancy, antepartum; Obesity affecting pregnancy; History of C-section; and Fetal cardiac echogenic focus, antepartum, fetus 1 on their problem list.  Patient reports no complaints.   Contractions: Irritability. Vag. Bleeding: None.  Movement: Present. Denies leaking of fluid.   The following portions of the patient's history were reviewed and updated as appropriate: allergies, current medications, past family history, past medical history, past social history, past surgical history and problem list. Problem list updated.  Objective:   Vitals:   10/16/20 1620  BP: 116/79  Pulse: (!) 101  Weight: 226 lb 12.8 oz (102.9 kg)    Fetal Status: Fetal Heart Rate (bpm): 155 Fundal Height: 37 cm Movement: Present     General:  Alert, oriented and cooperative. Patient is in no acute distress.  Skin: Skin is warm and dry. No rash noted.   Cardiovascular: Normal heart rate noted  Respiratory: Normal respiratory effort, no problems with respiration noted  Abdomen: Soft, gravid, appropriate for gestational age. Pain/Pressure: Present     Pelvic:  Cervical exam deferred        Extremities: Normal range of motion.  Edema: Trace  Mental Status: Normal mood and affect. Normal behavior. Normal judgment and thought content.   Urinalysis:      Assessment and Plan:  Pregnancy: G2P1001 at [redacted]w[redacted]d  1. Supervision of high risk pregnancy, antepartum Routine care. D/w pt more re: BC nv - Culture, beta strep (group b only) - GC/Chlamydia probe amp ([place])not at [facility]  2. History of cesarean  section Desires repeat. Request made for 39/0  3. Morbid obesity with BMI of 40.0-44.9, adult (HCC) 16 lbs TWG  4. History of pre-eclampsia Continue low dose asa  Term labor symptoms and general obstetric precautions including but not limited to vaginal bleeding, contractions, leaking of fluid and fetal movement were reviewed in detail with the patient. Please refer to After Visit Summary for other counseling recommendations.  Return in about 1 week (around 10/23/2020) for md or app, in person.   Louviers Bing, MD

## 2020-10-19 LAB — CULTURE, BETA STREP (GROUP B ONLY): Strep Gp B Culture: NEGATIVE

## 2020-10-21 ENCOUNTER — Encounter (HOSPITAL_COMMUNITY): Payer: Self-pay

## 2020-10-21 NOTE — Patient Instructions (Addendum)
Jennifer Lynn  10/21/2020   Your procedure is scheduled on:  11/01/2020  Arrive at 0730 at Entrance C on [address] at [facility]  and [employer]. You are invited to use the FREE valet parking or use the Visitor's parking deck.  Pick up the phone at the desk and dial [phone].  Call this number if you have problems the morning of surgery: [phone]  Remember:   Do not eat food:(After Midnight) Desps de medianoche.  Do not drink clear liquids: (After Midnight) Desps de medianoche.  Take these medicines the morning of surgery with A SIP OF WATER:  none   Do not wear jewelry, make-up or nail polish.  Do not wear lotions, powders, or perfumes. Do not wear deodorant.  Do not shave 48 hours prior to surgery.  Do not bring valuables to the hospital.  [facility] is not   responsible for any belongings or valuables brought to the hospital.  Contacts, dentures or bridgework may not be worn into surgery.  Leave suitcase in the car. After surgery it may be brought to your room.  For patients admitted to the hospital, checkout time is 11:00 AM the day of              discharge.      Please read over the following fact sheets that you were given:     Preparing for Surgery

## 2020-10-23 ENCOUNTER — Ambulatory Visit: Payer: Medicaid Other

## 2020-10-23 ENCOUNTER — Ambulatory Visit: Payer: Medicaid Other | Attending: Obstetrics and Gynecology | Admitting: *Deleted

## 2020-10-23 ENCOUNTER — Ambulatory Visit (HOSPITAL_BASED_OUTPATIENT_CLINIC_OR_DEPARTMENT_OTHER): Payer: Medicaid Other

## 2020-10-23 ENCOUNTER — Ambulatory Visit (INDEPENDENT_AMBULATORY_CARE_PROVIDER_SITE_OTHER): Payer: Medicaid Other | Admitting: Obstetrics & Gynecology

## 2020-10-23 ENCOUNTER — Other Ambulatory Visit: Payer: Self-pay

## 2020-10-23 ENCOUNTER — Encounter: Payer: Self-pay | Admitting: *Deleted

## 2020-10-23 VITALS — BP 124/79 | HR 55 | Wt 223.0 lb

## 2020-10-23 DIAGNOSIS — O358XX1 Maternal care for other (suspected) fetal abnormality and damage, fetus 1: Secondary | ICD-10-CM

## 2020-10-23 DIAGNOSIS — E669 Obesity, unspecified: Secondary | ICD-10-CM | POA: Insufficient documentation

## 2020-10-23 DIAGNOSIS — O358XX0 Maternal care for other (suspected) fetal abnormality and damage, not applicable or unspecified: Secondary | ICD-10-CM

## 2020-10-23 DIAGNOSIS — O3663X0 Maternal care for excessive fetal growth, third trimester, not applicable or unspecified: Secondary | ICD-10-CM | POA: Diagnosis not present

## 2020-10-23 DIAGNOSIS — O99213 Obesity complicating pregnancy, third trimester: Secondary | ICD-10-CM

## 2020-10-23 DIAGNOSIS — Z7982 Long term (current) use of aspirin: Secondary | ICD-10-CM | POA: Insufficient documentation

## 2020-10-23 DIAGNOSIS — Z3A37 37 weeks gestation of pregnancy: Secondary | ICD-10-CM | POA: Diagnosis not present

## 2020-10-23 DIAGNOSIS — O0990 Supervision of high risk pregnancy, unspecified, unspecified trimester: Secondary | ICD-10-CM

## 2020-10-23 DIAGNOSIS — Z98891 History of uterine scar from previous surgery: Secondary | ICD-10-CM

## 2020-10-23 DIAGNOSIS — Z362 Encounter for other antenatal screening follow-up: Secondary | ICD-10-CM

## 2020-10-23 DIAGNOSIS — O35BXX1 Maternal care for other (suspected) fetal abnormality and damage, fetal cardiac anomalies, fetus 1: Secondary | ICD-10-CM

## 2020-10-23 DIAGNOSIS — O09293 Supervision of pregnancy with other poor reproductive or obstetric history, third trimester: Secondary | ICD-10-CM | POA: Diagnosis not present

## 2020-10-23 DIAGNOSIS — Z148 Genetic carrier of other disease: Secondary | ICD-10-CM | POA: Diagnosis not present

## 2020-10-23 DIAGNOSIS — Z3689 Encounter for other specified antenatal screening: Secondary | ICD-10-CM

## 2020-10-23 DIAGNOSIS — O34219 Maternal care for unspecified type scar from previous cesarean delivery: Secondary | ICD-10-CM | POA: Diagnosis not present

## 2020-10-23 DIAGNOSIS — Z8759 Personal history of other complications of pregnancy, childbirth and the puerperium: Secondary | ICD-10-CM

## 2020-10-23 NOTE — Patient Instructions (Signed)
Return to office for any scheduled appointments. Call the office or go to the MAU at [facility] if:  You begin to have strong, frequent contractions  Your water breaks.  Sometimes it is a big gush of fluid, sometimes it is just a trickle that keeps getting your panties wet or running down your legs  You have vaginal bleeding.  It is normal to have a small amount of spotting if your cervix was checked.   You do not feel your baby moving like normal.  If you do not, get something to eat and drink and lay down and focus on feeling your baby move.   If your baby is still not moving like normal, you should call the office or go to MAU.  Any other obstetric concerns.   

## 2020-10-23 NOTE — Progress Notes (Signed)
PRENATAL VISIT NOTE  Subjective:  Jennifer Lynn is a 24 y.o. G2P1001 at [redacted]w[redacted]d being seen today for ongoing prenatal care.  She is currently monitored for the following issues for this high-risk pregnancy and has Morbid obesity with BMI of 40.0-44.9, adult (HCC); Alpha thalassemia trait; History of pre-eclampsia; Supervision of high risk pregnancy, antepartum; Obesity affecting pregnancy; History of C-section; Fetal cardiac echogenic focus, antepartum, fetus 1; and Macrosomia affecting management of mother in third trimester, single gestation on their problem list.  Patient reports no complaints.  Contractions: Not present. Vag. Bleeding: None.  Movement: Present. Denies leaking of fluid.   The following portions of the patient's history were reviewed and updated as appropriate: allergies, current medications, past family history, past medical history, past social history, past surgical history and problem list.   Objective:   Vitals:   10/23/20 1024  BP: 124/79  Pulse: (!) 55  Weight: 223 lb (101.2 kg)    Fetal Status: Fetal Heart Rate (bpm): 138   Movement: Present     General:  Alert, oriented and cooperative. Patient is in no acute distress.  Skin: Skin is warm and dry. No rash noted.   Cardiovascular: Normal heart rate noted  Respiratory: Normal respiratory effort, no problems with respiration noted  Abdomen: Soft, gravid, appropriate for gestational age.  Pain/Pressure: Present     Pelvic: Cervical exam deferred        Extremities: Normal range of motion.  Edema: Trace  Mental Status: Normal mood and affect. Normal behavior. Normal judgment and thought content.   Imaging: [place] MFM OB FOLLOW UP  Result Date: 10/23/2020 ----------------------------------------------------------------------  OBSTETRICS REPORT                       (Signed Final 10/23/2020 10:14 am) ---------------------------------------------------------------------- Patient Info  ID #:       [id number]                           D.O.B.:  1996-01-20 (24 yrs)  Name:       Jennifer Lynn              Visit Date: 10/23/2020 09:13 am ---------------------------------------------------------------------- Performed By  Attending:        Ma Rings MD         Ref. Address:     [address]                                                             [place], [place]                                                             [address]  Performed By:     Reinaldo Raddle            Secondary Phy.:   [facility] MAU/Triage                    RDMS  Referred By:      [facility]  Location[facility]         [facility]                                Fetal Care at                                                             [facility]                                                             Women ---------------------------------------------------------------------- Orders  #  Description                           Code        Ordered By  1  [place] MFM OB FOLLOW UP                   [phone]    Rosana Hoes ----------------------------------------------------------------------  #  Order #                     Accession #                Episode #  1  [id number]                   [id number]                 [id number] ---------------------------------------------------------------------- Indications  Obesity complicating pregnancy (BMI 36)        O99.210 E66.9  Currently taking ASA  Genetic carrier (Alpha Thal trait)             Z14.8  LOW risk NIPS  Poor obstetric history: Previous               O09.299  preeclampsia / eclampsia/gestational HTN  Previous cesarean delivery, antepartum         O34.219  Echogenic intracardiac focus of the heart      O35.8XX0  (EIF)  Encounter for other antenatal screening        Z36.2  follow-up  [redacted] weeks gestation of pregnancy                Z3A.37 ---------------------------------------------------------------------- Fetal Evaluation  Num Of Fetuses:         1  Fetal Heart Rate(bpm):  143   Cardiac Activity:       Observed  Presentation:           Cephalic  Placenta:               Posterior  P. Cord Insertion:      Previously Visualized  Amniotic Fluid  AFI FV:      Within normal limits  AFI Sum(cm)     %Tile       Largest Pocket(cm)  14.47  55          5.88  RUQ(cm)       RLQ(cm)       LUQ(cm)        LLQ(cm)  4.7           2.48          5.88           1.41 ---------------------------------------------------------------------- Biometry  BPD:      94.6  mm     G. Age:  38w 4d         86  %    CI:        77.85   %    70 - 86                                                          FL/HC:      21.1   %    20.9 - 22.7  HC:      339.3  mm     G. Age:  39w 0d         57  %    HC/AC:      0.87        0.92 - 1.05  AC:      389.8  mm     G. Age:  43w 0d       > 99  %    FL/BPD:     75.7   %    71 - 87  FL:       71.6  mm     G. Age:  36w 5d         23  %    FL/AC:      18.4   %    20 - 24  Est. FW:    4197  gm      9 lb 4 oz   > 99  % ---------------------------------------------------------------------- OB History  Blood Type:   O+  Gravidity:    2         Term:   1        Prem:   0        SAB:   0  TOP:          0       Ectopic:  0        Living: 1 ---------------------------------------------------------------------- Gestational Age  LMP:           37w 6d        Date:  02/01/20                 EDD:   11/07/20  U/S Today:     39w 2d                                        EDD:   10/28/20  Best:          37w 6d     Det. By:  LMP  (02/01/20)          EDD:   11/07/20 ---------------------------------------------------------------------- Anatomy  Cranium:               Previously seen  LVOT:                   Previously seen  Cavum:                 Previously seen        Aortic Arch:            Previously seen  Ventricles:            Appears normal         Ductal Arch:            Previously seen  Choroid Plexus:        Previously seen        Diaphragm:              Appears normal  Cerebellum:             Previously seen        Stomach:                Appears normal, left                                                                        sided  Posterior Fossa:       Previously seen        Abdomen:                Previously seen  Nuchal Fold:           Previously seen        Abdominal Wall:         Previously seen  Face:                  Orbits and profile     Cord Vessels:           Previously seen                         previously seen  Lips:                  Previously seen        Kidneys:                Appear normal  Palate:                Not well visualized    Bladder:                Appears normal  Thoracic:              Previously seen        Spine:                  Previously seen  Heart:                 Echogenic focus        Upper Extremities:      Previously seen                         in LV prev  RVOT:                  Appears  normal         Lower Extremities:      Previously seen  Other:  Female gender previously seen. Heels and 5th digit visualized          previously. Technically difficult due to advanced GA and fetal position. ---------------------------------------------------------------------- Cervix Uterus Adnexa  Cervix  Not visualized (advanced GA >24wks) ---------------------------------------------------------------------- Comments  This patient was seen for a follow up growth scan due to a  large for gestational age fetus that has been noted during her  prenatal ultrasounds.  She denies any problems since her  last exam.  She was informed that the fetal growth continues to measure  at the 99th percentile for her gestational age.  There was  normal amniotic fluid noted.  She understands the limitations of ultrasound in the  estimation of fetal weight at her current gestational age.  Due to the large for gestational age fetus, the patient already  has a repeat cesarean delivery scheduled on November 01, 2020. ----------------------------------------------------------------------                    Ma Rings, MD Electronically Signed Final Report   10/23/2020 10:14 am ----------------------------------------------------------------------  [place] MFM OB FOLLOW UP  Result Date: 09/26/2020 ----------------------------------------------------------------------  OBSTETRICS REPORT                       (Signed Final 09/26/2020 04:35 pm) ---------------------------------------------------------------------- Patient Info  ID #:       [id number]                          D.O.B.:  [birth year] (23 yrs)  Name:       PERL FOLMAR              Visit Date: 09/26/2020 04:13 pm ---------------------------------------------------------------------- Performed By  Attending:        Ma Rings MD         Ref. Address:     [address]                                                             [place], [place]                                                             [address]  Performed By:     Emeline Darling BS,      Secondary Phy.:   [facility] MAU/Triage                    RDMS  Referred By:      [facility]          Location[facility]         [facility]                                Fetal  Care at                                                             [facility] for                                                             Women ---------------------------------------------------------------------- Orders  #  Description                           Code        Ordered By  1  [place] MFM OB FOLLOW UP                   [id number]    PEGGY CONSTANT ----------------------------------------------------------------------  #  Order #                     Accession #                Episode #  1  [id number]                   [id number]                 [id number] ---------------------------------------------------------------------- Indications  Obesity complicating pregnancy (BMI 36)        O99.210 E66.9  Currently taking ASA  Genetic carrier (Alpha Thal trait)             Z14.8  LOW risk NIPS   Poor obstetric history: Previous               O09.299  preeclampsia / eclampsia/gestational HTN  Previous cesarean delivery, antepartum         O34.219  Echogenic intracardiac focus of the heart      O35.8XX0  (EIF)  [redacted] weeks gestation of pregnancy                Z3A.34  Encounter for other antenatal screening        Z36.2  follow-up ---------------------------------------------------------------------- Fetal Evaluation  Num Of Fetuses:         1  Fetal Heart Rate(bpm):  132  Cardiac Activity:       Observed  Presentation:           Cephalic  Placenta:               Posterior  P. Cord Insertion:      Previously Visualized  Amniotic Fluid  AFI FV:      Within normal limits  AFI Sum(cm)     %Tile       Largest Pocket(cm)  15.06           54          5.4  RUQ(cm)       RLQ(cm)       LUQ(cm)        LLQ(cm)  3.48          2.4           3.78  5.4 ---------------------------------------------------------------------- Biometry  BPD:      88.7  mm     G. Age:  35w 6d         91  %    CI:        77.39   %    70 - 86                                                          FL/HC:      21.6   %    19.4 - 21.8  HC:      319.2  mm     G. Age:  36w 0d         65  %    HC/AC:      0.97        0.96 - 1.11  AC:       330   mm     G. Age:  36w 6d         99  %    FL/BPD:     77.6   %    71 - 87  FL:       68.8  mm     G. Age:  35w 2d         75  %    FL/AC:      20.8   %    20 - 24  Est. FW:    2900  gm      6 lb 6 oz     96  % ---------------------------------------------------------------------- OB History  Blood Type:   O+  Gravidity:    2         Term:   1        Prem:   0        SAB:   0  TOP:          0       Ectopic:  0        Living: 1 ---------------------------------------------------------------------- Gestational Age  LMP:           34w 0d        Date:  02/01/20                 EDD:   11/07/20  U/S Today:     36w 0d                                        EDD:   10/24/20  Best:          34w 0d     Det. By:  LMP   (02/01/20)          EDD:   11/07/20 ---------------------------------------------------------------------- Anatomy  Cranium:               Previously seen        LVOT:                   Previously seen  Cavum:                 Previously seen        Aortic Arch:  Previously seen  Ventricles:            Previously seen        Ductal Arch:            Previously seen  Choroid Plexus:        Previously seen        Diaphragm:              Previously seen  Cerebellum:            Previously seen        Stomach:                Appears normal, left                                                                        sided  Posterior Fossa:       Previously seen        Abdomen:                Previously seen  Nuchal Fold:           Previously seen        Abdominal Wall:         Previously seen  Face:                  Orbits and profile     Cord Vessels:           Previously seen                         previously seen  Lips:                  Previously seen        Kidneys:                Appear normal  Palate:                Not well visualized    Bladder:                Appears normal  Thoracic:              Previously seen        Spine:                  Previously seen  Heart:                 Echogenic focus        Upper Extremities:      Previously seen                         in LV prev  RVOT:                  Previously seen        Lower Extremities:      Previously seen  Other:  Female gender previously seen. Heels and 5th digit visualized          previously. Technically difficult due to advanced GA and fetal position. ---------------------------------------------------------------------- Cervix Uterus Adnexa  Cervix  Not visualized (advanced GA >24wks) ---------------------------------------------------------------------- Comments  This patient was seen for a follow up growth scan due to  maternal obesity.  She has screened negative for gestational  diabetes in her current pregnancy.  She was informed that  the fetal growth continues to measure  large for her gestational age (23 percentile).  There was  normal amniotic fluid noted today.  As the fetus is measuring on the larger normal end, a follow  up exam was scheduled in 4 weeks to assess the fetal growth  closer to delivery. ----------------------------------------------------------------------                   Ma Rings, MD Electronically Signed Final Report   09/26/2020 04:35 pm ----------------------------------------------------------------------   Assessment and Plan:  Pregnancy: G2P1001 at [redacted]w[redacted]d 1. Macrosomia affecting management of mother in third trimester, single gestation 2. History of C-section Already scheduled for RCS at 39 weeks.  3. History of pre-eclampsia Normal BP. Continue ASA.  4. [redacted] weeks gestation of pregnancy 5. Supervision of high risk pregnancy, antepartum Negative pelvic cultures next week, patient is aware. Term labor symptoms and general obstetric precautions including but not limited to vaginal bleeding, contractions, leaking of fluid and fetal movement were reviewed in detail with the patient. Please refer to After Visit Summary for other counseling recommendations.   Return in about 1 week (around 10/30/2020) for OFFICE OB VISIT (MD only).  Future Appointments  Date Time Provider Department Center  10/30/2020  9:30 AM [facility]-LD PAT 1 [facility]-INDC None  10/30/2020 10:30 AM MC-SCREENING MC-SDSC None  10/31/2020 10:00 AM Hermina Staggers, MD [facility]    Jaynie Collins, MD

## 2020-10-24 ENCOUNTER — Inpatient Hospital Stay (HOSPITAL_COMMUNITY)
Admission: AD | Admit: 2020-10-24 | Discharge: 2020-10-24 | Disposition: A | Discharge status: Home / Self Care | Payer: Medicaid Other | Attending: Obstetrics and Gynecology | Admitting: Obstetrics and Gynecology

## 2020-10-24 ENCOUNTER — Encounter (HOSPITAL_COMMUNITY): Payer: Self-pay | Admitting: Obstetrics and Gynecology

## 2020-10-24 DIAGNOSIS — O2243 Hemorrhoids in pregnancy, third trimester: Secondary | ICD-10-CM | POA: Diagnosis not present

## 2020-10-24 DIAGNOSIS — Z3A38 38 weeks gestation of pregnancy: Secondary | ICD-10-CM | POA: Diagnosis not present

## 2020-10-24 MED ORDER — OXYCODONE-ACETAMINOPHEN 5-325 MG PO TABS
1.0000 | ORAL_TABLET | Freq: Once | ORAL | Status: AC
  Administered 2020-10-24: 1 via ORAL
  Filled 2020-10-24: qty 1

## 2020-10-24 MED ORDER — PROCTOFOAM HC 1-1 % EX FOAM: 1.0000 | Freq: Two times a day (BID) | CUTANEOUS | 5 refills | Status: DC

## 2020-10-24 MED ORDER — DOCUSATE SODIUM 100 MG PO CAPS
100.0000 mg | ORAL_CAPSULE | Freq: Two times a day (BID) | ORAL | 2 refills | Status: DC | PRN
Start: 2020-10-24 — End: 2021-12-10

## 2020-10-24 MED ORDER — OXYCODONE-ACETAMINOPHEN 5-325 MG PO TABS: 1.0000 | ORAL_TABLET | Freq: Four times a day (QID) | ORAL | 0 refills | Status: DC | PRN

## 2020-10-24 NOTE — MAU Provider Note (Signed)
Chief Complaint:  Hemorrhoids   First Provider Initiated Contact with Patient 10/24/20 0447     HPI: Jennifer Lynn is a 24 y.o. G2P1001 at 59w0dwho presents to maternity admissions reporting iincreased swelling and pain of hemorrhoids.  Has been using Tucks pads with no relief.  .Is not taking colace She reports good fetal movement, denies LOF, vaginal bleeding, vaginal itching/burning, urinary symptoms, h/a, dizziness, n/v, diarrhea, constipation or fever/chills.   Other This is a recurrent problem. The problem occurs constantly. The problem has been gradually worsening. Pertinent negatives include no abdominal pain, chills, fever, myalgias or nausea. Exacerbated by: sitting, palpation. Treatments tried: Tucks pads.    RN Note: Jennifer Lynn is a 24 y.o. at [redacted]w[redacted]d here in MAU reporting: hemorrhoids that are very painful Pt has tried multiple remedies and it doesn't seem to help. Denies any problems with the pregnancy. Hx of C/S for PRE in 2018 Onset of complaint: ongoing Pain score: 10  Past Medical History: Past Medical History:  Diagnosis Date  . Anemia   . Blood transfusion without reported diagnosis    patient thinks she had after previous delivery  . Medical history non-contributory   . UTI (urinary tract infection)     Past obstetric history: OB History  Gravida Para Term Preterm AB Living  2 1 1  0 0 1  SAB TAB Ectopic Multiple Live Births  0 0 0 0 1    # Outcome Date GA Lbr Len/2nd Weight Sex Delivery Anes PTL Lv  2 Current           1 Term 09/16/17 [redacted]w[redacted]d 17:40 / 07:35 3580 g F CS-LTranv EPI  LIV     Birth Comments: mild preeclampsia, chorioamnionitis    Past Surgical History: Past Surgical History:  Procedure Laterality Date  . CESAREAN SECTION N/A 09/16/2017   Procedure: CESAREAN SECTION;  Surgeon: 11/16/2017, MD;  Location: [facility] BIRTHING SUITES;  Service: Obstetrics;  Laterality: N/A;    Family History: Family History  Problem Relation Age of  Onset  . Diabetes Maternal Grandmother   . Diabetes Paternal Grandmother   . Hypertension Mother   . Hypertension Father     Social History: Social History   Tobacco Use  . Smoking status: Never Smoker  . Smokeless tobacco: Never Used  Vaping Use  . Vaping Use: Never used  Substance Use Topics  . Alcohol use: Not Currently    Comment: occasionally   . Drug use: No    Allergies: No Known Allergies  Meds:  Medications Prior to Admission  Medication Sig Dispense Refill Last Dose  . aspirin EC 81 MG tablet Take 1 tablet (81 mg total) by mouth daily. 30 tablet 11 10/24/2020 at Unknown time  . Prenatal Vit-Fe Fumarate-FA (PRENATAL COMPLETE) 14-0.4 MG TABS Take 1 tablet by mouth daily. 60 tablet 11 10/24/2020 at Unknown time  . witch hazel-glycerin (TUCKS) pad Apply 1 application topically as needed for itching.   10/24/2020 at Unknown time    I have reviewed patient's Past Medical Hx, Surgical Hx, Family Hx, Social Hx, medications and allergies.   ROS:  Review of Systems  Constitutional: Negative for chills and fever.  Gastrointestinal: Negative for abdominal pain and nausea.  Musculoskeletal: Negative for myalgias.   Other systems negative  Physical Exam   Patient Vitals for the past 24 hrs:  BP Temp Pulse Resp SpO2  10/24/20 0418 (!) 128/59 97.9 F (36.6 C) 63 16 99 %   Constitutional: Well-developed, well-nourished female in  no acute distress.  Cardiovascular: normal rate and rhythm Respiratory: normal effort, clear to auscultation bilaterally GI: Abd soft, non-tender, gravid appropriate for gestational age.   No rebound or guarding. MS: Extremities nontender, no edema, normal ROM Neurologic: Alert and oriented x 4.  GU: Neg CVAT. Anal exam reveals large hemorrhoid, 1-2cm, not excoriated, not bleeding   FHT:  Baseline 130 , moderate variability, accelerations present, no decelerations Contractions: Occasional    Labs: No results found for this or any previous  visit (from the past 24 hour(s)). O/Positive/-- (05/14 9563)  Imaging:    MAU Course/MDM: Discussed options for treatment Will prescribe Proctofoam HC, Colace, and limited Rx for Percocet for night time use NST reviewed, reactive Consult Dr Donavan Foil with presentation, exam findings and test results.  Treatments in MAU included Percocet for pain  Assessment: Single IUP at [redacted]w[redacted]d Painful hemorrhoid  Plan: Discharge home Has C/S scheduled next week Rx Proctofoam HC, Colace and Percocet for pain. Labor precautions and fetal kick counts Follow up in Office for prenatal visits  Encouraged to return here or to other Urgent Care/ED if she develops worsening of symptoms, increase in pain, fever, or other concerning symptoms.  Pt stable at time of discharge.  Wynelle Bourgeois CNM, MSN Certified Nurse-Midwife 10/24/2020 5:11 AM

## 2020-10-24 NOTE — MAU Note (Signed)
. °  Jennifer Lynn is a 24 y.o. at [redacted]w[redacted]d here in MAU reporting: hemorrhoids that are very painful Pt has tried multiple remedies and it doesn't seem to help. Denies any problems with the pregnancy. Hx of C/S for PRE in 2018  Onset of complaint: ongoing Pain score: 10 Vitals:   10/24/20 0418  BP: (!) 128/59  Pulse: 63  Resp: 16  Temp: 97.9 F (36.6 C)  SpO2: 99%     FHT:145 Lab orders placed from triage:

## 2020-10-24 NOTE — Discharge Instructions (Signed)

## 2020-10-30 ENCOUNTER — Other Ambulatory Visit: Payer: Self-pay

## 2020-10-30 ENCOUNTER — Inpatient Hospital Stay (HOSPITAL_COMMUNITY)
Admission: AD | Admit: 2020-10-30 | Discharge: 2020-10-30 | Disposition: A | Discharge status: Home / Self Care | Payer: Medicaid Other | Source: Home / Self Care | Attending: Obstetrics and Gynecology | Admitting: Obstetrics and Gynecology

## 2020-10-30 ENCOUNTER — Encounter (HOSPITAL_COMMUNITY)
Admission: RE | Admit: 2020-10-30 | Discharge: 2020-10-30 | Disposition: A | Discharge status: Home / Self Care | Payer: Medicaid Other | Source: Ambulatory Visit | Attending: Obstetrics and Gynecology | Admitting: Obstetrics and Gynecology

## 2020-10-30 ENCOUNTER — Encounter (HOSPITAL_COMMUNITY): Payer: Self-pay | Admitting: Obstetrics and Gynecology

## 2020-10-30 ENCOUNTER — Other Ambulatory Visit (HOSPITAL_COMMUNITY): Payer: Medicaid Other

## 2020-10-30 DIAGNOSIS — Z20822 Contact with and (suspected) exposure to covid-19: Secondary | ICD-10-CM | POA: Insufficient documentation

## 2020-10-30 DIAGNOSIS — Z3A38 38 weeks gestation of pregnancy: Secondary | ICD-10-CM | POA: Insufficient documentation

## 2020-10-30 DIAGNOSIS — Z01812 Encounter for preprocedural laboratory examination: Secondary | ICD-10-CM | POA: Insufficient documentation

## 2020-10-30 DIAGNOSIS — O479 False labor, unspecified: Secondary | ICD-10-CM

## 2020-10-30 DIAGNOSIS — O471 False labor at or after 37 completed weeks of gestation: Secondary | ICD-10-CM | POA: Insufficient documentation

## 2020-10-30 LAB — TYPE AND SCREEN
ABO/RH(D): O POS
Antibody Screen: NEGATIVE

## 2020-10-30 LAB — CBC
HCT: 30.8 % — ABNORMAL LOW (ref 36.0–46.0)
Hemoglobin: 9.6 g/dL — ABNORMAL LOW (ref 12.0–15.0)
MCH: 24.7 pg — ABNORMAL LOW (ref 26.0–34.0)
MCHC: 31.2 g/dL (ref 30.0–36.0)
MCV: 79.2 fL — ABNORMAL LOW (ref 80.0–100.0)
Platelets: 220 10*3/uL (ref 150–400)
RBC: 3.89 MIL/uL (ref 3.87–5.11)
RDW: 15.4 % (ref 11.5–15.5)
WBC: 5.4 10*3/uL (ref 4.0–10.5)
nRBC: 0 % (ref 0.0–0.2)

## 2020-10-30 LAB — RESPIRATORY PANEL BY RT PCR (FLU A&B, COVID)
Influenza A by PCR: NEGATIVE
Influenza B by PCR: NEGATIVE
SARS Coronavirus 2 by RT PCR: NEGATIVE

## 2020-10-30 NOTE — MAU Note (Signed)
Dr. Betti Cruz came down to MAU to discuss plan of care with patient.  Discussed refusal of cervical exam and when to return to MAU.

## 2020-10-30 NOTE — MAU Note (Signed)
Pt at preop appoinment  Has been ctx since Saturday. Got worse on Tuesday. Did not want to come in because she was afraid she would be sent home.

## 2020-10-30 NOTE — MAU Note (Signed)
Patient requesting to eat.  Advised that she cannot eat until further evaluation.

## 2020-10-30 NOTE — Progress Notes (Addendum)
S: [name] is a 24 y.o. G2P1001 at [redacted]w[redacted]d  who presents to MAU today for labor evaluation.     Cervical exam by RN:  Dilation:  (Pt. refused cervical exam) Cervical Position: Posterior Station: Ballotable Exam by:: K.Wilson.RN  Fetal Monitoring: Baseline: 150 Variability: moderate Accelerations: present Decelerations: none Contractions: q4-5 min  MDM Discussed patient with RN. NST reviewed. Scheduled for c-section on 11/20. Discussed return precautions such as more painful, consistent contractions.   A: SIUP at [redacted]w[redacted]d  False labor  P: Discharge home Labor precautions and kick counts included in AVS Scheduled for c-section on 11/20.  Patient may return to MAU as needed or when in labor   Trula Slade, MD 10/30/2020 12:00 PM   GME ATTESTATION:  I saw and evaluated the patient. I agree with the findings and the plan of care as documented in the resident's note.  Alric Seton, MD OB Fellow, [facility], [facility] for [facility] 10/30/2020 12:19 PM

## 2020-10-30 NOTE — Pre-Procedure Instructions (Signed)
Pt c/o ucs q 5 minutes for hours.  Palpated moderate while in my office for lab draw and preop instructions.  Instructions reviewed.  Labs drawn.  Pt sent to MAU for eval r/o labor.  MAU notifed and Dr Debroah Loop aware.

## 2020-10-31 ENCOUNTER — Inpatient Hospital Stay (HOSPITAL_COMMUNITY): Payer: Medicaid Other | Admitting: Anesthesiology

## 2020-10-31 ENCOUNTER — Other Ambulatory Visit: Payer: Self-pay

## 2020-10-31 ENCOUNTER — Inpatient Hospital Stay (HOSPITAL_COMMUNITY)
Admission: AD | Admit: 2020-10-31 | Discharge: 2020-11-03 | DRG: 787 | Disposition: A | Discharge status: Home / Self Care | Payer: Medicaid Other | Attending: Obstetrics & Gynecology | Admitting: Obstetrics & Gynecology

## 2020-10-31 ENCOUNTER — Encounter: Payer: Medicaid Other | Admitting: Obstetrics and Gynecology

## 2020-10-31 ENCOUNTER — Encounter (HOSPITAL_COMMUNITY)
Admission: AD | Disposition: A | Discharge status: Home / Self Care | Payer: Self-pay | Source: Home / Self Care | Attending: Obstetrics & Gynecology

## 2020-10-31 ENCOUNTER — Encounter (HOSPITAL_COMMUNITY): Payer: Self-pay | Admitting: Obstetrics & Gynecology

## 2020-10-31 DIAGNOSIS — Z20822 Contact with and (suspected) exposure to covid-19: Secondary | ICD-10-CM | POA: Diagnosis not present

## 2020-10-31 DIAGNOSIS — O9081 Anemia of the puerperium: Secondary | ICD-10-CM | POA: Diagnosis not present

## 2020-10-31 DIAGNOSIS — O34211 Maternal care for low transverse scar from previous cesarean delivery: Secondary | ICD-10-CM | POA: Diagnosis not present

## 2020-10-31 DIAGNOSIS — D62 Acute posthemorrhagic anemia: Secondary | ICD-10-CM | POA: Diagnosis not present

## 2020-10-31 DIAGNOSIS — O99214 Obesity complicating childbirth: Secondary | ICD-10-CM | POA: Diagnosis present

## 2020-10-31 DIAGNOSIS — Z98891 History of uterine scar from previous surgery: Secondary | ICD-10-CM

## 2020-10-31 DIAGNOSIS — D563 Thalassemia minor: Secondary | ICD-10-CM | POA: Diagnosis present

## 2020-10-31 DIAGNOSIS — O3663X0 Maternal care for excessive fetal growth, third trimester, not applicable or unspecified: Secondary | ICD-10-CM | POA: Diagnosis present

## 2020-10-31 DIAGNOSIS — O0990 Supervision of high risk pregnancy, unspecified, unspecified trimester: Secondary | ICD-10-CM

## 2020-10-31 DIAGNOSIS — O35BXX1 Maternal care for other (suspected) fetal abnormality and damage, fetal cardiac anomalies, fetus 1: Secondary | ICD-10-CM | POA: Diagnosis present

## 2020-10-31 DIAGNOSIS — Z8759 Personal history of other complications of pregnancy, childbirth and the puerperium: Secondary | ICD-10-CM

## 2020-10-31 DIAGNOSIS — O358XX1 Maternal care for other (suspected) fetal abnormality and damage, fetus 1: Secondary | ICD-10-CM | POA: Diagnosis present

## 2020-10-31 DIAGNOSIS — Z3A39 39 weeks gestation of pregnancy: Secondary | ICD-10-CM

## 2020-10-31 DIAGNOSIS — O322XX0 Maternal care for transverse and oblique lie, not applicable or unspecified: Secondary | ICD-10-CM | POA: Diagnosis not present

## 2020-10-31 LAB — COMPREHENSIVE METABOLIC PANEL
ALT: 10 U/L (ref 0–44)
AST: 15 U/L (ref 15–41)
Albumin: 2.8 g/dL — ABNORMAL LOW (ref 3.5–5.0)
Alkaline Phosphatase: 198 U/L — ABNORMAL HIGH (ref 38–126)
Anion gap: 11 (ref 5–15)
BUN: 6 mg/dL (ref 6–20)
CO2: 20 mmol/L — ABNORMAL LOW (ref 22–32)
Calcium: 8.8 mg/dL — ABNORMAL LOW (ref 8.9–10.3)
Chloride: 106 mmol/L (ref 98–111)
Creatinine, Ser: 0.7 mg/dL (ref 0.44–1.00)
GFR, Estimated: >60 mL/min (ref >60)
Glucose, Bld: 97 mg/dL (ref 70–99)
Potassium: 4.2 mmol/L (ref 3.5–5.1)
Sodium: 137 mmol/L (ref 135–145)
Total Bilirubin: 0.7 mg/dL (ref 0.3–1.2)
Total Protein: 6.7 g/dL (ref 6.5–8.1)

## 2020-10-31 LAB — CBC
HCT: 25.5 % — ABNORMAL LOW (ref 36.0–46.0)
Hemoglobin: 7.7 g/dL — ABNORMAL LOW (ref 12.0–15.0)
MCH: 24.1 pg — ABNORMAL LOW (ref 26.0–34.0)
MCHC: 30.2 g/dL (ref 30.0–36.0)
MCV: 79.7 fL — ABNORMAL LOW (ref 80.0–100.0)
Platelets: 210 10*3/uL (ref 150–400)
RBC: 3.2 MIL/uL — ABNORMAL LOW (ref 3.87–5.11)
RDW: 15.1 % (ref 11.5–15.5)
WBC: 7.9 10*3/uL (ref 4.0–10.5)
nRBC: 0 % (ref 0.0–0.2)

## 2020-10-31 LAB — RPR: RPR Ser Ql: NONREACTIVE

## 2020-10-31 LAB — PROTEIN / CREATININE RATIO, URINE
Creatinine, Urine: 213.87 mg/dL
Protein Creatinine Ratio: 0.05 mg/mg{Cre} (ref 0.00–0.15)
Total Protein, Urine: 10 mg/dL

## 2020-10-31 SURGERY — Surgical Case
Anesthesia: Spinal

## 2020-10-31 MED ORDER — DIPHENHYDRAMINE HCL 50 MG/ML IJ SOLN
12.5000 mg | INTRAMUSCULAR | Status: DC | PRN
  Administered 2020-10-31: 12.5 mg via INTRAVENOUS
  Filled 2020-10-31: qty 1

## 2020-10-31 MED ORDER — KETOROLAC TROMETHAMINE 30 MG/ML IJ SOLN
30.0000 mg | Freq: Four times a day (QID) | INTRAMUSCULAR | Status: AC | PRN
  Administered 2020-10-31: 30 mg via INTRAMUSCULAR

## 2020-10-31 MED ORDER — SIMETHICONE 80 MG PO CHEW
80.0000 mg | CHEWABLE_TABLET | ORAL | Status: DC
  Administered 2020-11-01 – 2020-11-02 (×3): 80 mg via ORAL
  Filled 2020-10-31 (×3): qty 1

## 2020-10-31 MED ORDER — MEPERIDINE HCL 25 MG/ML IJ SOLN: 6.2500 mg | INTRAMUSCULAR | Status: DC | PRN

## 2020-10-31 MED ORDER — PHENYLEPHRINE HCL-NACL 20-0.9 MG/250ML-% IV SOLN
INTRAVENOUS | Status: AC
  Filled 2020-10-31: qty 250

## 2020-10-31 MED ORDER — WITCH HAZEL-GLYCERIN EX PADS: 1.0000 "application " | MEDICATED_PAD | CUTANEOUS | Status: DC | PRN

## 2020-10-31 MED ORDER — PROMETHAZINE HCL 25 MG/ML IJ SOLN: 6.2500 mg | INTRAMUSCULAR | Status: DC | PRN

## 2020-10-31 MED ORDER — TETANUS-DIPHTH-ACELL PERTUSSIS 5-2.5-18.5 LF-MCG/0.5 IM SUSY: 0.5000 mL | PREFILLED_SYRINGE | Freq: Once | INTRAMUSCULAR | Status: DC

## 2020-10-31 MED ORDER — BUPIVACAINE IN DEXTROSE 0.75-8.25 % IT SOLN
INTRATHECAL | Status: DC | PRN
  Administered 2020-10-31: 1.8 mL via INTRATHECAL

## 2020-10-31 MED ORDER — SIMETHICONE 80 MG PO CHEW: 80.0000 mg | CHEWABLE_TABLET | ORAL | Status: DC | PRN

## 2020-10-31 MED ORDER — LACTATED RINGERS IV SOLN: INTRAVENOUS | Status: DC | PRN

## 2020-10-31 MED ORDER — SODIUM CHLORIDE 0.9 % IV SOLN
500.0000 mg | Freq: Once | INTRAVENOUS | Status: AC
  Administered 2020-10-31: 500 mg via INTRAVENOUS

## 2020-10-31 MED ORDER — DEXAMETHASONE SODIUM PHOSPHATE 4 MG/ML IJ SOLN
INTRAMUSCULAR | Status: DC | PRN
  Administered 2020-10-31: 4 mg via INTRAVENOUS

## 2020-10-31 MED ORDER — NALBUPHINE HCL 10 MG/ML IJ SOLN: 5.0000 mg | Freq: Once | INTRAMUSCULAR | Status: DC | PRN

## 2020-10-31 MED ORDER — ONDANSETRON HCL 4 MG/2ML IJ SOLN
INTRAMUSCULAR | Status: AC
  Filled 2020-10-31: qty 2

## 2020-10-31 MED ORDER — SENNOSIDES-DOCUSATE SODIUM 8.6-50 MG PO TABS
2.0000 | ORAL_TABLET | ORAL | Status: DC
  Administered 2020-11-01 – 2020-11-02 (×3): 2 via ORAL
  Filled 2020-10-31 (×3): qty 2

## 2020-10-31 MED ORDER — PHENYLEPHRINE HCL-NACL 20-0.9 MG/250ML-% IV SOLN
INTRAVENOUS | Status: DC | PRN
  Administered 2020-10-31: 60 ug/min via INTRAVENOUS

## 2020-10-31 MED ORDER — FENTANYL CITRATE (PF) 100 MCG/2ML IJ SOLN
INTRAMUSCULAR | Status: DC | PRN
  Administered 2020-10-31: 15 ug via INTRATHECAL

## 2020-10-31 MED ORDER — TRANEXAMIC ACID-NACL 1000-0.7 MG/100ML-% IV SOLN
INTRAVENOUS | Status: AC
  Filled 2020-10-31: qty 100

## 2020-10-31 MED ORDER — SIMETHICONE 80 MG PO CHEW
80.0000 mg | CHEWABLE_TABLET | Freq: Three times a day (TID) | ORAL | Status: DC
  Administered 2020-10-31 – 2020-11-03 (×10): 80 mg via ORAL
  Filled 2020-10-31 (×9): qty 1

## 2020-10-31 MED ORDER — ACETAMINOPHEN 500 MG PO TABS
1000.0000 mg | ORAL_TABLET | Freq: Four times a day (QID) | ORAL | Status: DC
  Administered 2020-10-31 – 2020-11-03 (×12): 1000 mg via ORAL
  Filled 2020-10-31 (×10): qty 2

## 2020-10-31 MED ORDER — DEXAMETHASONE SODIUM PHOSPHATE 4 MG/ML IJ SOLN
INTRAMUSCULAR | Status: AC
  Filled 2020-10-31: qty 1

## 2020-10-31 MED ORDER — DIPHENHYDRAMINE HCL 25 MG PO CAPS: 25.0000 mg | ORAL_CAPSULE | ORAL | Status: DC | PRN

## 2020-10-31 MED ORDER — SODIUM CHLORIDE 0.9 % IV SOLN
INTRAVENOUS | Status: AC
  Filled 2020-10-31: qty 500

## 2020-10-31 MED ORDER — ENOXAPARIN SODIUM 60 MG/0.6ML ~~LOC~~ SOLN
0.5000 mg/kg | SUBCUTANEOUS | Status: DC
  Administered 2020-10-31 – 2020-11-02 (×3): 50 mg via SUBCUTANEOUS
  Filled 2020-10-31 (×3): qty 0.6

## 2020-10-31 MED ORDER — FENTANYL CITRATE (PF) 100 MCG/2ML IJ SOLN
100.0000 ug | Freq: Once | INTRAMUSCULAR | Status: AC
  Administered 2020-10-31: 100 ug via INTRAVENOUS
  Filled 2020-10-31: qty 2

## 2020-10-31 MED ORDER — FENTANYL CITRATE (PF) 100 MCG/2ML IJ SOLN
INTRAMUSCULAR | Status: AC
  Filled 2020-10-31: qty 2

## 2020-10-31 MED ORDER — LACTATED RINGERS IV SOLN: INTRAVENOUS | Status: DC

## 2020-10-31 MED ORDER — NALOXONE HCL 0.4 MG/ML IJ SOLN: 0.4000 mg | INTRAMUSCULAR | Status: DC | PRN

## 2020-10-31 MED ORDER — OXYCODONE HCL 5 MG PO TABS
5.0000 mg | ORAL_TABLET | ORAL | Status: DC | PRN
  Administered 2020-11-01 – 2020-11-03 (×11): 10 mg via ORAL
  Filled 2020-10-31 (×11): qty 2

## 2020-10-31 MED ORDER — OXYTOCIN-SODIUM CHLORIDE 30-0.9 UT/500ML-% IV SOLN: 2.5000 [IU]/h | INTRAVENOUS | Status: AC

## 2020-10-31 MED ORDER — SOD CITRATE-CITRIC ACID 500-334 MG/5ML PO SOLN
30.0000 mL | ORAL | Status: AC
  Administered 2020-10-31: 30 mL via ORAL
  Filled 2020-10-31: qty 15

## 2020-10-31 MED ORDER — CEFAZOLIN SODIUM-DEXTROSE 2-4 GM/100ML-% IV SOLN
2.0000 g | INTRAVENOUS | Status: AC
  Administered 2020-10-31: 2 g via INTRAVENOUS
  Filled 2020-10-31: qty 100

## 2020-10-31 MED ORDER — ACETAMINOPHEN 500 MG PO TABS
1000.0000 mg | ORAL_TABLET | Freq: Four times a day (QID) | ORAL | Status: AC
  Administered 2020-10-31: 1000 mg via ORAL
  Filled 2020-10-31 (×3): qty 2

## 2020-10-31 MED ORDER — OXYCODONE HCL 5 MG PO TABS: 5.0000 mg | ORAL_TABLET | Freq: Once | ORAL | Status: DC | PRN

## 2020-10-31 MED ORDER — NALBUPHINE HCL 10 MG/ML IJ SOLN: 5.0000 mg | INTRAMUSCULAR | Status: DC | PRN

## 2020-10-31 MED ORDER — MORPHINE SULFATE (PF) 0.5 MG/ML IJ SOLN
INTRAMUSCULAR | Status: DC | PRN
  Administered 2020-10-31: .15 mg via INTRATHECAL

## 2020-10-31 MED ORDER — COCONUT OIL OIL: 1.0000 "application " | TOPICAL_OIL | Status: DC | PRN

## 2020-10-31 MED ORDER — PRENATAL MULTIVITAMIN CH
1.0000 | ORAL_TABLET | Freq: Every day | ORAL | Status: DC
  Administered 2020-10-31 – 2020-11-03 (×4): 1 via ORAL
  Filled 2020-10-31 (×4): qty 1

## 2020-10-31 MED ORDER — DIPHENHYDRAMINE HCL 50 MG/ML IJ SOLN
INTRAMUSCULAR | Status: DC | PRN
  Administered 2020-10-31 (×2): 12.5 mg via INTRAVENOUS

## 2020-10-31 MED ORDER — OXYTOCIN-SODIUM CHLORIDE 30-0.9 UT/500ML-% IV SOLN
INTRAVENOUS | Status: DC | PRN
  Administered 2020-10-31: 400 mL via INTRAVENOUS

## 2020-10-31 MED ORDER — DIPHENHYDRAMINE HCL 25 MG PO CAPS: 25.0000 mg | ORAL_CAPSULE | Freq: Four times a day (QID) | ORAL | Status: DC | PRN

## 2020-10-31 MED ORDER — ONDANSETRON HCL 4 MG/2ML IJ SOLN
INTRAMUSCULAR | Status: DC | PRN
  Administered 2020-10-31: 4 mg via INTRAVENOUS

## 2020-10-31 MED ORDER — MORPHINE SULFATE (PF) 0.5 MG/ML IJ SOLN
INTRAMUSCULAR | Status: AC
  Filled 2020-10-31: qty 10

## 2020-10-31 MED ORDER — MENTHOL 3 MG MT LOZG: 1.0000 | LOZENGE | OROMUCOSAL | Status: DC | PRN

## 2020-10-31 MED ORDER — NALOXONE HCL 4 MG/10ML IJ SOLN
1.0000 ug/kg/h | INTRAVENOUS | Status: DC | PRN
  Filled 2020-10-31: qty 5

## 2020-10-31 MED ORDER — TRANEXAMIC ACID-NACL 1000-0.7 MG/100ML-% IV SOLN
INTRAVENOUS | Status: DC | PRN
  Administered 2020-10-31: 1000 mg via INTRAVENOUS

## 2020-10-31 MED ORDER — DIBUCAINE (PERIANAL) 1 % EX OINT: 1.0000 "application " | TOPICAL_OINTMENT | CUTANEOUS | Status: DC | PRN

## 2020-10-31 MED ORDER — SODIUM CHLORIDE 0.9% FLUSH: 3.0000 mL | INTRAVENOUS | Status: DC | PRN

## 2020-10-31 MED ORDER — IBUPROFEN 800 MG PO TABS
800.0000 mg | ORAL_TABLET | Freq: Three times a day (TID) | ORAL | Status: AC
  Administered 2020-10-31 – 2020-11-02 (×7): 800 mg via ORAL
  Filled 2020-10-31 (×8): qty 1

## 2020-10-31 MED ORDER — DIPHENHYDRAMINE HCL 50 MG/ML IJ SOLN
INTRAMUSCULAR | Status: AC
  Filled 2020-10-31: qty 1

## 2020-10-31 MED ORDER — ONDANSETRON HCL 4 MG/2ML IJ SOLN: 4.0000 mg | Freq: Three times a day (TID) | INTRAMUSCULAR | Status: DC | PRN

## 2020-10-31 MED ORDER — SODIUM CHLORIDE 0.9 % IV SOLN
500.0000 mg | Freq: Once | INTRAVENOUS | Status: AC
  Administered 2020-10-31: 500 mg via INTRAVENOUS
  Filled 2020-10-31: qty 25

## 2020-10-31 MED ORDER — KETOROLAC TROMETHAMINE 30 MG/ML IJ SOLN: 30.0000 mg | Freq: Once | INTRAMUSCULAR | Status: DC | PRN

## 2020-10-31 MED ORDER — HYDROMORPHONE HCL 1 MG/ML IJ SOLN: 0.2500 mg | INTRAMUSCULAR | Status: DC | PRN

## 2020-10-31 MED ORDER — KETOROLAC TROMETHAMINE 30 MG/ML IJ SOLN: 30.0000 mg | Freq: Four times a day (QID) | INTRAMUSCULAR | Status: AC | PRN

## 2020-10-31 MED ORDER — OXYCODONE HCL 5 MG/5ML PO SOLN: 5.0000 mg | Freq: Once | ORAL | Status: DC | PRN

## 2020-10-31 MED ORDER — FAMOTIDINE IN NACL 20-0.9 MG/50ML-% IV SOLN
20.0000 mg | Freq: Once | INTRAVENOUS | Status: AC
  Administered 2020-10-31: 20 mg via INTRAVENOUS
  Filled 2020-10-31: qty 50

## 2020-10-31 MED ORDER — SCOPOLAMINE 1 MG/3DAYS TD PT72
1.0000 | MEDICATED_PATCH | Freq: Once | TRANSDERMAL | Status: AC
  Administered 2020-10-31: 1.5 mg via TRANSDERMAL

## 2020-10-31 SURGICAL SUPPLY — 44 items
APL SKNCLS STERI-STRIP NONHPOA (GAUZE/BANDAGES/DRESSINGS) ×1
BENZOIN TINCTURE PRP APPL 2/3 (GAUZE/BANDAGES/DRESSINGS) ×2 IMPLANT
CHLORAPREP W/TINT 26ML (MISCELLANEOUS) ×2 IMPLANT
CLAMP CORD UMBIL (MISCELLANEOUS) IMPLANT
CLOSURE STERI STRIP 1/2 X4 (GAUZE/BANDAGES/DRESSINGS) ×2 IMPLANT
CLOTH BEACON ORANGE TIMEOUT ST (SAFETY) ×2 IMPLANT
DRAPE C SECTION CLR SCREEN (DRAPES) IMPLANT
DRSG OPSITE POSTOP 4X10 (GAUZE/BANDAGES/DRESSINGS) ×2 IMPLANT
ELECT REM PT RETURN 9FT ADLT (ELECTROSURGICAL) ×2
ELECTRODE REM PT RTRN 9FT ADLT (ELECTROSURGICAL) ×1 IMPLANT
EXTRACTOR VACUUM M CUP 4 TUBE (SUCTIONS) IMPLANT
GLOVE BIO SURGEON STRL SZ7.5 (GLOVE) ×2 IMPLANT
GLOVE BIOGEL PI IND STRL 7.0 (GLOVE) ×1 IMPLANT
GLOVE BIOGEL PI INDICATOR 7.0 (GLOVE) ×1
GOWN STRL REUS W/TWL 2XL LVL3 (GOWN DISPOSABLE) ×2 IMPLANT
GOWN STRL REUS W/TWL LRG LVL3 (GOWN DISPOSABLE) ×4 IMPLANT
HEMOSTAT ARISTA ABSORB 3G PWDR (HEMOSTASIS) ×2 IMPLANT
KIT ABG SYR 3ML LUER SLIP (SYRINGE) IMPLANT
NEEDLE HYPO 22GX1.5 SAFETY (NEEDLE) ×2 IMPLANT
NEEDLE HYPO 25X5/8 SAFETYGLIDE (NEEDLE) IMPLANT
NS IRRIG 1000ML POUR BTL (IV SOLUTION) ×2 IMPLANT
PACK C SECTION WH (CUSTOM PROCEDURE TRAY) ×2 IMPLANT
PAD OB MATERNITY 4.3X12.25 (PERSONAL CARE ITEMS) ×2 IMPLANT
PENCIL SMOKE EVAC W/HOLSTER (ELECTROSURGICAL) ×2 IMPLANT
RTRCTR C-SECT PINK 25CM LRG (MISCELLANEOUS) ×2 IMPLANT
SPONGE LAP 18X18 X RAY DECT (DISPOSABLE) ×2 IMPLANT
STRIP CLOSURE SKIN 1/2X4 (GAUZE/BANDAGES/DRESSINGS) ×2 IMPLANT
SUT CHROMIC 1 CTX 36 (SUTURE) ×4 IMPLANT
SUT PLAIN 0 NONE (SUTURE) IMPLANT
SUT PLAIN 2 0 (SUTURE) ×2
SUT PLAIN ABS 2-0 CT1 27XMFL (SUTURE) ×1 IMPLANT
SUT VIC AB 1 CT1 36 (SUTURE) ×2 IMPLANT
SUT VIC AB 2-0 CT1 (SUTURE) ×2 IMPLANT
SUT VIC AB 2-0 CT1 27 (SUTURE) ×2
SUT VIC AB 2-0 CT1 TAPERPNT 27 (SUTURE) ×1 IMPLANT
SUT VIC AB 3-0 CT1 27 (SUTURE) ×2
SUT VIC AB 3-0 CT1 TAPERPNT 27 (SUTURE) ×1 IMPLANT
SUT VIC AB 3-0 SH 27 (SUTURE)
SUT VIC AB 3-0 SH 27X BRD (SUTURE) IMPLANT
SUT VIC AB 4-0 KS 27 (SUTURE) ×4 IMPLANT
SYR BULB IRRIGATION 50ML (SYRINGE) IMPLANT
TOWEL OR 17X24 6PK STRL BLUE (TOWEL DISPOSABLE) ×2 IMPLANT
TRAY FOLEY W/BAG SLVR 14FR LF (SET/KITS/TRAYS/PACK) ×2 IMPLANT
WATER STERILE IRR 1000ML POUR (IV SOLUTION) ×4 IMPLANT

## 2020-10-31 NOTE — Lactation Note (Addendum)
This note was copied from a baby's chart. Lactation Consultation Note  Patient Name: Jennifer Lynn YIFOY'D Date: 10/31/2020 Reason for consult: Initial assessment   Mother is a P2, infant is 35 hours old , mother attempt to breastfeed her first child for one week. .  Mother was given [facility] brochure and basic teaching done.   Reviewed hand expression with mother.    She is active with  [facility] . Marland Kitchen   Mother was observed with infant latched on at the rt  breast in cradle hold lying in bed. Infant sustained latch for 10 mins. Observed that nipple was pinched when infant released . Observed colostrum when hand expressed. Infant placed in football hold. Infant latched much deeped. Observed infant suckling with audible swallows. Infant sustained latch for 10 mins.  Mother sleepy and offered to place infant in crib but mother declined because infant was breastfeeding well. Father asleep at bedside. Notified staff nurse Jennifer Lynn to check on mother.   Mother to continue to cue base feed infant and feed at least 8-12 times or more in 24 hours and advised to allow for cluster feeding infant as needed.   Mother to continue to due STS. Mother is aware of available LC services at [facility], BFSG'S, OP Dept, and phone # for questions or concerns about breastfeeding.  Mother receptive to all teaching and plan of care.     Maternal Data Has patient been taught Hand Expression?: Yes Does the patient have breastfeeding experience prior to this delivery?: Yes  Feeding Feeding Type: Breast Fed Nipple Type: Slow - flow  LATCH Score Latch: Grasps breast easily, tongue down, lips flanged, rhythmical sucking.  Audible Swallowing: A few with stimulation  Type of Nipple: Everted at rest and after stimulation  Comfort (Breast/Nipple): Soft / non-tender  Hold (Positioning): Assistance needed to correctly position infant at breast and maintain latch.  LATCH Score: 8  Interventions Interventions:  Breast feeding basics reviewed;Assisted with latch;Skin to skin;Hand express;Breast compression;Adjust position;Support pillows;Position options  Lactation Tools Discussed/Used WIC Program: Yes   Consult Status Consult Status: Follow-up Date: 11/01/20 Follow-up type: In-patient    Stevan Born [facility] 10/31/2020, 9:28 AM

## 2020-10-31 NOTE — H&P (Signed)
OBSTETRIC ADMISSION HISTORY AND PHYSICAL  Jennifer Lynn is a 24 y.o. female G2P1001 with IUP at [redacted]w[redacted]d by L/20 presenting for latent labor in setting of prior Cesarean. She reports +FMs, No LOF, no VB, no blurry vision, headaches or peripheral edema, and RUQ pain.  She plans on  Breast feeding. She declines birth control.  She received her prenatal care at [facility]   Dating: By L/20 --->  Estimated Date of Delivery: 11/07/20  Sono:  @[redacted]w[redacted]d , CWD, normal anatomy, cephalic presentation, 4197g, EFW, >99% AC  Prenatal History/Complications:  -h/o Cesarean x1 (secondary to arrest of descent with fetal weight 3580g) -h/o Preeclampsia without severe features  Past Medical History: Past Medical History:  Diagnosis Date  . Anemia   . Blood transfusion without reported diagnosis    patient thinks she had after previous delivery  . Medical history non-contributory   . UTI (urinary tract infection)     Past Surgical History: Past Surgical History:  Procedure Laterality Date  . CESAREAN SECTION N/A 09/16/2017   Procedure: CESAREAN SECTION;  Surgeon: 11/16/2017, MD;  Location: [facility] BIRTHING SUITES;  Service: Obstetrics;  Laterality: N/A;    Obstetrical History: OB History    Gravida  2   Para  1   Term  1   Preterm  0   AB  0   Living  1     SAB  0   TAB  0   Ectopic  0   Multiple  0   Live Births  1           Social History Social History   Socioeconomic History  . Marital status: Single    Spouse name: Not on file  . Number of children: Not on file  . Years of education: Not on file  . Highest education level: Not on file  Occupational History  . Not on file  Tobacco Use  . Smoking status: Never Smoker  . Smokeless tobacco: Never Used  Vaping Use  . Vaping Use: Never used  Substance and Sexual Activity  . Alcohol use: Not Currently    Comment: occasionally   . Drug use: No  . Sexual activity: Yes    Birth control/protection: None  Other  Topics Concern  . Not on file  Social History Narrative  . Not on file   Social Determinants of Health   Financial Resource Strain:   . Difficulty of Paying Living Expenses: Not on file  Food Insecurity: No Food Insecurity  . Worried About [facility] in the Last Year: Never true  . Ran Out of Food in the Last Year: Never true  Transportation Needs: No Transportation Needs  . Lack of Transportation (Medical): No  . Lack of Transportation (Non-Medical): No  Physical Activity:   . Days of Exercise per Week: Not on file  . Minutes of Exercise per Session: Not on file  Stress:   . Feeling of Stress : Not on file  Social Connections:   . Frequency of Communication with Friends and Family: Not on file  . Frequency of Social Gatherings with Friends and Family: Not on file  . Attends Religious Services: Not on file  . Active Member of Clubs or Organizations: Not on file  . Attends Programme researcher, broadcasting/film/video Meetings: Not on file  . Marital Status: Not on file    Family History: Family History  Problem Relation Age of Onset  . Diabetes Maternal Grandmother   . Diabetes Paternal  Grandmother   . Hypertension Mother   . Hypertension Father     Allergies: No Known Allergies  Medications Prior to Admission  Medication Sig Dispense Refill Last Dose  . aspirin EC 81 MG tablet Take 1 tablet (81 mg total) by mouth daily. 30 tablet 11   . docusate sodium (COLACE) 100 MG capsule Take 1 capsule (100 mg total) by mouth 2 (two) times daily as needed. (Patient taking differently: Take 100 mg by mouth 2 (two) times daily as needed (constipation.). ) 30 capsule 2   . hydrocortisone-pramoxine (PROCTOFOAM HC) rectal foam Place 1 applicator rectally 2 (two) times daily. (Patient taking differently: Place 1 applicator rectally 2 (two) times daily as needed for hemorrhoids. ) 10 g 5   . oxyCODONE-acetaminophen (PERCOCET/ROXICET) 5-325 MG tablet Take 1-2 tablets by mouth every 6 (six) hours as  needed. (Patient not taking: Reported on 10/28/2020) 20 tablet 0   . Prenatal Vit-Fe Fumarate-FA (PRENATAL COMPLETE) 14-0.4 MG TABS Take 1 tablet by mouth daily. 60 tablet 11   . witch hazel-glycerin (TUCKS) pad Apply 1 application topically as needed for itching.        Review of Systems   All systems reviewed and negative except as stated in HPI  Last menstrual period 02/01/2020. General appearance: alert, cooperative and appears stated age Lungs: normal WOB Heart: regular rate Abdomen: soft, non-tender Extremities: no sign of DVT Presentation: cephalic on bedside ultrasound Fetal monitoringBaseline: 140 bpm, Variability: Good {> 6 bpm), Accelerations: Reactive and Decelerations: Absent Uterine activity Frequency: Every 2-4 minutes Dilation: 2 Effacement (%): 90 Exam by:: Wynelle Bourgeois, CNM  Prenatal labs: ABO, Rh: --/--/O POS (11/18 4650) Antibody: NEG (11/18 0950) Rubella: 3.30 (05/14 0950) RPR: Non Reactive (09/02 1004)  HBsAg: Negative (05/14 0950)  HIV: Non Reactive (09/02 1004)  GBS: Negative/-- (11/04 1654)  2 hr Glucola wnl Genetic screening  Anatomy US wnl except for echogenic intracardiac focus & LGA  Prenatal Transfer Tool  Maternal Diabetes: No Genetic Screening: Normal Maternal Ultrasounds/Referrals: normal except for echogenic intracardiac focus & LGA Fetal Ultrasounds or other Referrals:  None Maternal Substance Abuse:  No Significant Maternal Medications:  None  Significant Maternal Lab Results: Group B Strep negative  Results for orders placed or performed during the hospital encounter of 10/30/20 (from the past 24 hour(s))  Respiratory Panel by RT PCR (Flu A&B, Covid) - Nasopharyngeal Swab   Collection Time: 10/30/20 11:51 AM   Specimen: Nasopharyngeal Swab; Nasopharyngeal(NP) swabs in vial transport medium  Result Value Ref Range   SARS Coronavirus 2 by RT PCR NEGATIVE NEGATIVE   Influenza A by PCR NEGATIVE NEGATIVE   Influenza B by PCR  NEGATIVE NEGATIVE  Results for orders placed or performed during the hospital encounter of 10/30/20 (from the past 24 hour(s))  CBC   Collection Time: 10/30/20  9:42 AM  Result Value Ref Range   WBC 5.4 4.0 - 10.5 K/uL   RBC 3.89 3.87 - 5.11 MIL/uL   Hemoglobin 9.6 (L) 12.0 - 15.0 g/dL   HCT 35.4 (L) 36 - 46 %   MCV 79.2 (L) 80.0 - 100.0 fL   MCH 24.7 (L) 26.0 - 34.0 pg   MCHC 31.2 30.0 - 36.0 g/dL   RDW 65.6 81.2 - 75.1 %   Platelets 220 150 - 400 K/uL   nRBC 0.0 0.0 - 0.2 %  Type and screen [facility]   Collection Time: 10/30/20  9:50 AM  Result Value Ref Range   ABO/RH(D) [place]  POS    Antibody Screen NEG    Sample Expiration      11/02/2020,2359 Performed at [facility], [address], [place], [place] [address]     Patient Active Problem List   Diagnosis Date Noted  . Macrosomia affecting management of mother in third trimester, single gestation 10/23/2020  . Fetal cardiac echogenic focus, antepartum, fetus 1 06/18/2020  . Supervision of high risk pregnancy, antepartum 04/08/2020  . Obesity affecting pregnancy 04/08/2020  . History of C-section 04/08/2020  . History of pre-eclampsia 10/17/2017  . Alpha thalassemia trait 03/17/2017  . Morbid obesity with BMI of 40.0-44.9, adult (HCC) 06/26/2015    Assessment/Plan:  ELLENORA TALTON is a 24 y.o. G2P1001 at [redacted]w[redacted]d here for latent labor in setting of prior Cesarean x1 and current LGA fetus.  #Latent Labor in s/o prior Cesarean x1 secondary to arrest of descent.  The risks of cesarean section were discussed with the patient including but were not limited to: bleeding which may require transfusion or reoperation; infection which may require antibiotics; injury to bowel, bladder, ureters or other surrounding organs; injury to the fetus; need for additional procedures including hysterectomy in the event of a life-threatening hemorrhage; placental abnormalities wth subsequent pregnancies, incisional  problems, thromboembolic phenomenon and other postoperative/anesthesia complications.  The patient concurred with the proposed plan, giving informed written consent for the procedure.  Patient has been NPO since 1900 on 11/18; she will remain NPO for procedure. Anesthesia and OR aware.  Preoperative prophylactic antibiotics (azithromycin, ancef) and SCDs ordered on call to the OR.  To OR when ready.  #Pain: per anesthesia #FWB: Category 1 strip #ID: GBS negative #MOF: breast #MOC: none desired on admission #Circ: n/a #H/o Preeclampsia w/o severe features: Normal BP on admission. No concerning signs/symptoms. F/u Preeclampsia labs on admission. #LGA: EFW >99% & AC >99% at [redacted]w[redacted]d. Plan for repeat Cesarean (previously scheduled for 11/19) per MFM. Pt agrees and consented for repeat Cesarean as noted above.  Sheila Oats, MD  10/31/2020, 1:39 AM

## 2020-10-31 NOTE — Discharge Instructions (Signed)

## 2020-10-31 NOTE — Op Note (Signed)
Jennifer Lynn PROCEDURE DATE: 10/31/2020  PREOPERATIVE DIAGNOSES: Intrauterine pregnancy at [redacted]w[redacted]d weeks gestation; h/o Cesarean x1 (arrest of descent), LGA infant (EFW >99%, AC >99% at [redacted]w[redacted]d)  POSTOPERATIVE DIAGNOSES: The same, Occiput Posterior Fetal Presentation  PROCEDURE: Repeat Low Transverse Cesarean Section  SURGEON:  Dr. Scheryl Darter, MD   Dr. Lynnda Shields, MD  ANESTHESIOLOGY TEAM: Anesthesiologist: [name], DO CRNA: Trellis Paganini, CRNA  INDICATIONS: Jennifer Lynn is a 24 y.o. [phone] at [redacted]w[redacted]d here for cesarean section secondary to the indications listed under preoperative diagnoses; please see preoperative note for further details.  The risks of surgery were discussed with the patient including but were not limited to: bleeding which may require transfusion or reoperation; infection which may require antibiotics; injury to bowel, bladder, ureters or other surrounding organs; injury to the fetus; need for additional procedures including hysterectomy in the event of a life-threatening hemorrhage; formation of adhesions; placental abnormalities wth subsequent pregnancies; incisional problems; thromboembolic phenomenon and other postoperative/anesthesia complications.  The patient concurred with the proposed plan, giving informed written consent for the procedure.    FINDINGS:  Viable female infant in cephalic presentation.  Apgars 8 and 9.  Clear amniotic fluid.  Intact placenta, three vessel cord.  Normal uterus, fallopian tubes and ovaries bilaterally. Minimal adhesions. Right uterine extension  ANESTHESIA: Spinal INTRAVENOUS FLUIDS: 2950 ml   ESTIMATED BLOOD LOSS: 986 ml URINE OUTPUT:  75 ml SPECIMENS: Placenta sent to L&D COMPLICATIONS: right hysterotomy extension  PROCEDURE IN DETAIL:  The patient preoperatively received intravenous antibiotics (azithromycin, ancef) and had sequential compression devices applied to her lower extremities.  She was then  taken to the operating room where spinal anesthesia was administered and was found to be adequate. She was then placed in a dorsal supine position with a leftward tilt, and prepped and draped in a sterile manner.  A foley catheter was placed into her bladder and attached to constant gravity.  After an adequate timeout was performed, a Pfannenstiel skin incision was made with scalpel on her preexisting scar and carried through to the underlying layer of fascia. The fascia was incised in the midline, and this incision was extended bilaterally using the Mayo scissors.  Kocher clamps were applied to the superior aspect of the fascial incision and the underlying rectus muscles were dissected off bluntly and sharply.  A similar process was carried out on the inferior aspect of the fascial incision. The rectus muscles were separated in the midline and the peritoneum was entered bluntly. The Alexis self-retaining retractor was introduced into the abdominal cavity.  Attention was turned to the lower uterine segment where a bladder flap was created sharply and bluntly. A low transverse hysterotomy was made with a scalpel and extended bilaterally bluntly.  The infant was successfully delivered, the cord was clamped and cut after one minute, and the infant was handed over to the awaiting neonatology team. Uterine massage was then administered, and the placenta delivered intact with a three-vessel cord. The uterus was then cleared of clots and debris.  The hysterotomy was closed with 0 Vicryl in a running locked fashion, and an imbricating layer was also placed with 0 Vicryl. Right hysterotomy extension was noted and closed with good hemostasis. Several figure-of-eight 0 Vicryl serosal stitches were placed to help with hemostasis. The pelvis was cleared of all clot and debris. Arista was administered along uterine closure and hemostasis was confirmed on all surfaces.  The retractor was removed.  The peritoneum was closed with a  0 Vicryl running stitch. The fascia was then closed using 0 Vicryl in a running fashion.  The subcutaneous layer was irrigated, reapproximated with 2-0 plain gut interrupted stitches, and the skin was closed with a 4-0 Vicryl subcuticular stitch. The patient tolerated the procedure well. Sponge, instrument and needle counts were correct x 3.  She was taken to the recovery room in stable condition.   Sheila Oats, MD OB Fellow, Faculty Practice 10/31/2020 4:46 AM

## 2020-10-31 NOTE — Anesthesia Procedure Notes (Signed)
Spinal  Patient location during procedure: OR Start time: 10/31/2020 3:00 AM End time: 10/31/2020 3:04 AM Staffing Performed: anesthesiologist  Anesthesiologist: [name], DO Preanesthetic Checklist Completed: patient identified, IV checked, risks and benefits discussed, surgical consent, monitors and equipment checked, pre-op evaluation and timeout performed Spinal Block Patient position: right lateral decubitus Prep: DuraPrep and site prepped and draped Patient monitoring: cardiac monitor, continuous pulse ox and blood pressure Approach: midline Location: L3-4 Injection technique: single-shot Needle Needle type: Pencan  Needle gauge: 24 G Needle length: 9 cm Assessment Sensory level: T6 Additional Notes Functioning IV was confirmed and monitors were applied. Sterile prep and drape, including hand hygiene and sterile gloves were used. The patient was positioned and the spine was prepped. The skin was anesthetized with lidocaine.  Free flow of clear CSF was obtained prior to injecting local anesthetic into the CSF.  The spinal needle aspirated freely following injection.  The needle was carefully withdrawn.  The patient tolerated the procedure well.

## 2020-10-31 NOTE — MAU Note (Signed)
OB OR Press photographer and Anesthesia notified and report given by Nancee Liter, RN.  Women's AC notified by Erle Crocker, RN.

## 2020-10-31 NOTE — Transfer of Care (Signed)
Immediate Anesthesia Transfer of Care Note  Patient: Jennifer Lynn  Procedure(s) Performed: CESAREAN SECTION (N/A )  Patient Location: PACU  Anesthesia Type:Spinal  Level of Consciousness: awake, alert  and patient cooperative  Airway & Oxygen Therapy: Patient Spontanous Breathing  Post-op Assessment: Report given to RN and Post -op Vital signs reviewed and stable  Post vital signs: Reviewed and stable  Last Vitals:  Vitals Value Taken Time  BP 102/79 10/31/20 0436  Temp    Pulse 87 10/31/20 0440  Resp 22 10/31/20 0440  SpO2 100 % 10/31/20 0440  Vitals shown include unvalidated device data.  Last Pain: There were no vitals filed for this visit.       Complications: No complications documented.

## 2020-10-31 NOTE — Anesthesia Postprocedure Evaluation (Signed)
Anesthesia Post Note  Patient: Jennifer Lynn  Procedure(s) Performed: CESAREAN SECTION (N/A )     Patient location during evaluation: PACU Anesthesia Type: Spinal Level of consciousness: awake and alert and oriented Pain management: pain level controlled Vital Signs Assessment: post-procedure vital signs reviewed and stable Respiratory status: spontaneous breathing, nonlabored ventilation and respiratory function stable Cardiovascular status: blood pressure returned to baseline and stable Postop Assessment: no headache, no backache, spinal receding, patient able to bend at knees and no apparent nausea or vomiting Anesthetic complications: no   No complications documented.  Last Vitals:  Vitals:   10/31/20 0437 10/31/20 0445  BP: 102/79 97/67  Pulse: 96 72  Resp: 18 20  Temp:    SpO2: 97% 100%    Last Pain:  Vitals:   10/31/20 0445  PainSc: 0-No pain   Pain Goal:    LLE Motor Response: Non-purposeful movement (10/31/20 0432) LLE Sensation: Tingling (10/31/20 0432) RLE Motor Response: Non-purposeful movement (10/31/20 0432) RLE Sensation: Tingling (10/31/20 0432)     Epidural/Spinal Function Cutaneous sensation: Able to Discern Pressure (10/31/20 0430), Patient able to flex knees: Yes (10/31/20 0430), Patient able to lift hips off bed: No (10/31/20 0430), Back pain beyond tenderness at insertion site: No (10/31/20 0430), Progressively worsening motor and/or sensory loss: No (10/31/20 0430), Bowel and/or bladder incontinence post epidural: No (10/31/20 0430)  [name]

## 2020-10-31 NOTE — Discharge Summary (Signed)
Postpartum Discharge Summary     Patient Name: Jennifer Lynn DOB: 1996-06-30 MRN: [id number]  Date of admission: 10/31/2020 Delivery date:10/31/2020  Delivering provider: [place] Mode  Date of discharge: 11/03/2020  Admitting diagnosis: Supervision of high-risk pregnancy [O09.90] Intrauterine pregnancy: [redacted]w[redacted]d    Secondary diagnosis:  Principal Problem:   Cesarean delivery delivered Active Problems:   Morbid obesity with BMI of 40.0-44.9, adult ([place]   Alpha thalassemia trait   History of pre-eclampsia   History of C-section   Fetal cardiac echogenic focus, antepartum, fetus 1   Supervision of high-risk pregnancy  Additional problems: as noted above  Discharge diagnosis: Cesarean delivery delivered                                        Post partum procedures: IV venofer Augmentation: N/A Complications: None  Hospital course: Onset of Labor With Unplanned C/S   24y.o. yo G2P2002 at [id number] admitted in Latent Labor on 10/31/2020. Patient had a labor course significant for latent labor prior to planned repeat Cesarean secondary to LGA infant. The patient went for cesarean section due to  planned repeat Cesarean secondary to LGA infant with history of Cesarean x1 . Delivery details as follows: Membrane Rupture Time/Date: 3:24 AM ,10/31/2020   Delivery Method:C-Section, Low Transverse  Details of operation can be found in separate operative note. Patient had an uncomplicated postpartum course.  She is ambulating,tolerating a regular diet, passing flatus, and urinating well.  Patient is discharged home in stable condition 11/03/20.  Newborn Data: Birth date:10/31/2020  Birth time:3:25 AM  Gender:Female  Living status:Living  Apgars:8 ,9  Weight:8 lb 15 oz (4.054 kg)   Magnesium Sulfate received: No BMZ received: No Rhophylac:N/A MMR:N/A T-DaP:declined Flu: declined Transfusion: IV venofer  Physical exam  Vitals:   11/02/20 0552 11/02/20 1405 11/02/20  2256 11/03/20 0508  BP: 125/70 127/79 131/72 119/74  Pulse: 67 74 73 68  Resp: '18 18 18 16  ' Temp: 98.9 F (37.2 C) 98.7 F (37.1 C) 98.8 F (37.1 C)   TempSrc: Oral Oral Oral   SpO2: 99% 100%  100%  Weight:      Height:       General: alert, cooperative and no distress Lochia: appropriate Uterine Fundus: firm Incision: Dressing is clean, dry, and intact DVT Evaluation: No evidence of DVT seen on physical exam. Labs: Lab Results  Component Value Date   WBC 7.9 10/31/2020   HGB 7.7 (L) 10/31/2020   HCT 25.5 (L) 10/31/2020   MCV 79.7 (L) 10/31/2020   PLT 210 10/31/2020   CMP Latest Ref Rng & Units 10/31/2020  Glucose 70 - 99 mg/dL 97  BUN 6 - 20 mg/dL 6  Creatinine 0.44 - 1.00 mg/dL 0.70  Sodium 135 - 145 mmol/L 137  Potassium 3.5 - 5.1 mmol/L 4.2  Chloride 98 - 111 mmol/L 106  CO2 22 - 32 mmol/L 20(L)  Calcium 8.9 - 10.3 mg/dL 8.8(L)  Total Protein 6.5 - 8.1 g/dL 6.7  Total Bilirubin 0.3 - 1.2 mg/dL 0.7  Alkaline Phos 38 - 126 U/L 198(H)  AST 15 - 41 U/L 15  ALT 0 - 44 U/L 10   Edinburgh Score: Edinburgh Postnatal Depression Scale Screening Tool 11/01/2020  I have been able to laugh and see the funny side of things. 0  I have looked forward with enjoyment to things. 1  I have blamed myself unnecessarily when things went wrong. 1  I have been anxious or worried for no good reason. 3  I have felt scared or panicky for no good reason. 2  Things have been getting on top of me. 1  I have been so unhappy that I have had difficulty sleeping. 1  I have felt sad or miserable. 2  I have been so unhappy that I have been crying. 2  The thought of harming myself has occurred to me. 0  Edinburgh Postnatal Depression Scale Total 13     After visit meds:  Allergies as of 11/03/2020   No Known Allergies      Medication List     STOP taking these medications    aspirin EC 81 MG tablet       TAKE these medications    docusate sodium 100 MG capsule Commonly  known as: COLACE Take 1 capsule (100 mg total) by mouth 2 (two) times daily as needed. What changed: reasons to take this   ibuprofen 800 MG tablet Commonly known as: ADVIL Take 1 tablet (800 mg total) by mouth every 8 (eight) hours.   oxyCODONE-acetaminophen 5-325 MG tablet Commonly known as: PERCOCET/ROXICET Take 1 tablet by mouth every 4 (four) hours as needed for severe pain. What changed:  how much to take when to take this reasons to take this   Prenatal Complete 14-0.4 MG Tabs Take 1 tablet by mouth daily.   Proctofoam HC rectal foam Generic drug: hydrocortisone-pramoxine Place 1 applicator rectally 2 (two) times daily. What changed:  when to take this reasons to take this   witch hazel-glycerin pad Commonly known as: TUCKS Apply 1 application topically as needed for itching.        Discharge home in stable condition Infant Feeding: Breast Infant Disposition:home with mother Discharge instruction: per After Visit Summary and Postpartum booklet. Activity: Advance as tolerated. Pelvic rest for 6 weeks.  Diet: routine diet Future Appointments: Future Appointments  Date Time Provider [place]  11/10/2020  8:30 AM [facility] NURSE [facility]  12/02/2020 10:15 AM Clarnce Flock, MD [facility]   Follow up Visit:  [place] [facility] Follow up.   Specialty: Obstetrics and Gynecology Why: in 1 week for an incision check Contact information: [place] [phone]               Message sent to [facility] clinic on 10/31/20.  Please schedule this patient for a In person postpartum visit in 4 weeks with the following provider: Any provider. Additional Postpartum F/U:Incision check 1 week and BP check 1 week  High risk pregnancy complicated by:  now h/o Cesarean x2, LGA infant, h/o Preeclampsia in prior pregnancy Delivery mode:   C-Section, Low Transverse  Anticipated Birth Control:  Unsure   11/03/2020 Wende Mott, CNM

## 2020-10-31 NOTE — MAU Provider Note (Signed)
Presents screaming in pain with c/o frequent contractions Previous CS with repeat scheduled this weekend  Dilation: 2 Effacement (%): 90 Exam by:: Wynelle Bourgeois, CNM Bulging bag, presenting part not palpable  Pt informed that the ultrasound is considered a limited OB ultrasound and is not intended to be a complete ultrasound exam.  Patient also informed that the ultrasound is not being completed with the intent of assessing for fetal or placental anomalies or any pelvic abnormalities.  Explained that the purpose of today's ultrasound is to assess for presentation, BPP and amniotic fluid volume.  Patient acknowledges the purpose of the exam and the limitations of the study.    Vertex presentation  Will start IV and give Fentanyl for pain Handed off to Labor team

## 2020-10-31 NOTE — Anesthesia Preprocedure Evaluation (Signed)
Anesthesia Evaluation  Patient identified by MRN, date of birth, ID band Patient awake    Reviewed: Allergy & Precautions, NPO status , Patient's Chart, lab work & pertinent test results  Airway Mallampati: II  TM Distance: >3 FB Neck ROM: Full    Dental no notable dental hx.    Pulmonary neg pulmonary ROS,    Pulmonary exam normal breath sounds clear to auscultation       Cardiovascular negative cardio ROS Normal cardiovascular exam Rhythm:Regular Rate:Normal     Neuro/Psych negative neurological ROS  negative psych ROS   GI/Hepatic negative GI ROS, Neg liver ROS,   Endo/Other  Obesity BMI 38  Renal/GU negative Renal ROS  negative genitourinary   Musculoskeletal negative musculoskeletal ROS (+)   Abdominal (+) + obese,   Peds negative pediatric ROS (+)  Hematology negative hematology ROS (+) hct 30.8, plt 220   Anesthesia Other Findings   Reproductive/Obstetrics (+) Pregnancy 1 prior c section after failure to progress, epidural for that delivery Presented to MAU SROM and contracting                              Anesthesia Physical Anesthesia Plan  ASA: III and emergent  Anesthesia Plan: Spinal   Post-op Pain Management:    Induction:   PONV Risk Score and Plan: 2 and Ondansetron, Dexamethasone and Treatment may vary due to age or medical condition  Airway Management Planned: Natural Airway and Nasal Cannula  Additional Equipment: None  Intra-op Plan:   Post-operative Plan:   Informed Consent: I have reviewed the patients History and Physical, chart, labs and discussed the procedure including the risks, benefits and alternatives for the proposed anesthesia with the patient or authorized representative who has indicated his/her understanding and acceptance.       Plan Discussed with: CRNA  Anesthesia Plan Comments:         Anesthesia Quick Evaluation

## 2020-11-01 ENCOUNTER — Inpatient Hospital Stay (HOSPITAL_COMMUNITY)
Admission: RE | Admit: 2020-11-01 | Payer: Medicaid Other | Source: Home / Self Care | Admitting: Obstetrics and Gynecology

## 2020-11-01 NOTE — Progress Notes (Signed)
POSTPARTUM PROGRESS NOTE  POD #1  Subjective:  Jennifer Lynn is a 24 y.o. [id number] s/p rLTCS at [redacted]w[redacted]d.  She reports she doing well. No acute events overnight. She denies any problems with ambulating, voiding or po intake. Denies nausea or vomiting. She has passed flatus. Pain is well controlled.  Lochia is appropriate.  Objective: Blood pressure 119/74, pulse 60, temperature 98.6 F (37 C), temperature source Oral, resp. rate 16, height 5\' 4"  (1.626 m), weight 101.2 kg, last menstrual period 02/01/2020, SpO2 100 %, unknown if currently breastfeeding.  Physical Exam:  General: alert, cooperative and no distress Chest: no respiratory distress Heart:regular rate, distal pulses intact Abdomen: soft, nontender,  Uterine Fundus: firm, appropriately tender DVT Evaluation: No calf swelling or tenderness Extremities: No LE edema Skin: warm, dry; incision clean/dry/intact w/ dressing in place  Recent Labs    10/30/20 0942 10/31/20 0642  HGB 9.6* 7.7*  HCT 30.8* 25.5*    Assessment/Plan: Jennifer Lynn is a 24 y.o. 30 s/p rLTCS at [redacted]w[redacted]d for elective repeat with h/o arrest of descent.  POD#1 - Doing welll; pain well controlled.  Routine postpartum care  OOB, ambulated  Lovenox for VTE prophylaxis Acute Post Op Blood Loss Anemia: Received Venofer 11/19. Asymptomatic.  Contraception: Declines  Feeding: Breast   Dispo: Plan for discharge POD #2 or #3 .   LOS: 1 day   12/19, D.O. OB Fellow  11/01/2020, 7:46 AM

## 2020-11-01 NOTE — Lactation Note (Signed)
This note was copied from a baby's chart. Lactation Consultation Note  Patient Name: Jennifer Lynn [id number] Date: 11/01/2020 Reason for consult: Follow-up assessment;Mother's request;Infant weight loss (6% weight loss)   Infant is 39 hours old 39 weeks with 6% weight loss. Mom is both breast and bottle feeding. Infant on Similac w/ Iron 20 cal/oz taking 15-20 ml at each feed based on cues. Mom is offering the breast at the end of the bottle feeding last time at the breast today was 11 am for 10 minutes.   Infant had 3 urine and 2 stool today. LC reviewed with how to due hand expression to collect drops of colostrum prior to latching. Snappies provided to collect colostrum and LC reviewed how to spoon feed it as appetizer or desert.   Mom is interested in breast feeding but was concerned her milk was not in. LC reviewed how to feed based on cues 8-12 x in 24 hour period no more than 4 hours without an attempt. Mom will now offer the breasts first, getting a deep latch and looking for signs of milk transfer. Mom had some pain with the latch at first but we were able to do suck training and get the lips flanged in a football position and pain resolved. Mom had some cramping while infant was latched, LC applied heat to the area and she felt better.   Infant latched on right breast for 10 minutes showing signs of milk transfer. Infant still nursing at the end of the visit.   Mom's nipples are erect with no signs of compression stripe or abrasions noted.   Plan 1. To feed based on cues 8-12x in 24 hour period no more than 4 hours without an attempt. Mom to latch at the breast first, offer both breasts and look for signs of milk transfer.         2. Breastfeeding supplementation guide reviewed with parents based on hours after birth. Mom to offer EBM or formula based on guidelines using paced bottle feeding and slow flow nipple.         3. Mom to prime the breasts before latching by doing breast  massage and hand expression. Mom has colostrum that is easily expressed.          4. All questions answered at the end of the visit. Mom has electric pump at home.    Consult Status Consult Status: Follow-up Date: 11/02/20 Follow-up type: In-patient    Chosen Jennifer  Lynn 11/01/2020, 6:57 PM

## 2020-11-01 NOTE — Progress Notes (Signed)
CSW received consult for Edinburgh score of 13. CSW met with MOB at bedside to offer support and complete assessment. On arrival, CSW introduced self and stated visit purpose. FOB and infant Jhurni were present, however, after PPD/A and SIDS education, FOB stepped out of room to offer MOB privacy during assessment. MOB and FOB were pleasant and engaged during visit.    CSW provided education regarding the baby blues period vs. perinatal mood disorders, discussed treatment and gave resources for mental health follow up if concerns arise.  CSW recommends self-evaluation during the postpartum time period using the New Mom Checklist from Postpartum Progress and encouraged MOB and FOB to contact a medical professional if symptoms are noted at any time. MOB and FOB stated understanding and denied any questions.     CSW provided review of Sudden Infant Death Syndrome (SIDS) precautions. MOB and FOB stated understanding and denied any questions. MOB confirmed having all needed items for baby including car seat and bassinet for baby's safe sleep.     During assessment, MOB explained EPDS score. MOB stated pregnancy was difficult due to working from home without daycare and experiencing other personal concerns which made her feel alone at times. MOB denied any BH dx, SI, HI, or domestic violence. MOB stated she shared feeling with OB and was offered RX treatment. MOB stated she is against taking medication, therefore decliend. MOB stated she and FOB were going to counseling but she is interested in individual counseling. CSW provided list of counseling options and recommended psychologytoday.com for additional research as MOB was not pleased with previous therapist. MOB identified current coping skills as staying busy and going outside for walks with daughter. MOB identified current mood as tired but in-love with baby Jhurni. MOB identified FOB and family as support. MOB denied any additional needs or concerns.   CSW  identifies no further need for intervention and no barriers to discharge at this time.  Dinari Stgermaine D. Dell Briner, MSW, LCSW Clinical Social Worker [phone] 

## 2020-11-02 NOTE — Progress Notes (Signed)
POSTPARTUM PROGRESS NOTE  Subjective: Jennifer Lynn is a 24 y.o. [id number] s/p POD #2 LTCS at [redacted]w[redacted]d.  She reports she doing well. No acute events overnight. She denies any problems with ambulating, voiding or po intake. Denies nausea or vomiting. She has not passed flatus. Pain is well controlled.  Lochia is mild.  Objective: Blood pressure 125/70, pulse 67, temperature 98.9 F (37.2 C), temperature source Oral, resp. rate 18, height 5\' 4"  (1.626 m), weight 101.2 kg, last menstrual period 02/01/2020, SpO2 99 %, unknown if currently breastfeeding.  Physical Exam:  General: alert, cooperative and no distress Chest: no respiratory distress Abdomen: soft, mild tenderness on palpation, dressing in place covering incision, area clean and dry  Uterine Fundus: firm and at level of umbilicus Extremities: No calf swelling or tenderness, no LE edema noted bilaterally   Recent Labs    10/30/20 0942 10/31/20 0642  HGB 9.6* 7.7*  HCT 30.8* 25.5*    Assessment/Plan: Jennifer Lynn is a 24 y.o. 30 s/p POD#2  at [redacted]w[redacted]d for C-section.  Routine Postpartum Care: Doing well, pain well-controlled.  -- Continue routine care, lactation support  -- Contraception: previously declined -- Feeding: breast --Anemia: Last Hgb 7.7, s/p venofer 11/19 --hx of preeclampsia: Normotensive, stable BP 125/70. Asymptomatic.   Dispo: Plan for discharge anticipated today or tomorrow.  12/19, DO 11/02/2020 9:08 AM

## 2020-11-02 NOTE — Lactation Note (Signed)
This note was copied from a baby's chart. Lactation Consultation Note  Patient Name: Jennifer Lynn [id number] Date: 11/02/2020 Reason for consult: Follow-up assessment;Infant weight loss;Other (Comment) (6 % weight loss, mom requested a hand pump)  Baby is 101 hours old  As LC entered the room baby latched on the right breast / modified cradle.  Baby has been feeding 1215 pm with swallows .  Per mom has been shown how to hand express.  LC recommended since the baby has had bottles, prior to latching breast massage, hand express, pre-pump to prime the milk ducts to enhance the  Let down.  Mom mentioned with her 1st baby she did not have breast changes and her milk did not come. With this pregnancy she had several breast changes and especially hypersensitive nipples. LC reassured mom that is a good sign for milk supply.  LC recommended and encouraged mom to continue to give baby lots practice  Latching  And it will get easier.  Mom aware to call if she needs help to latch.    Maternal Data Has patient been taught Hand Expression?: Yes (per mom)  Feeding Feeding Type:  (baby latched when LC entered the room and per mom comfortable)  LATCH Score                   Interventions Interventions: Breast feeding basics reviewed  Lactation Tools Discussed/Used Tools: Pump;Flanges Flange Size: 27;24 Breast pump type: Manual Pump Review: Setup, frequency, and cleaning   Consult Status Consult Status: Follow-up Date: 11/03/20 Follow-up type: In-patient    [name] 11/02/2020, 12:59 PM

## 2020-11-03 MED ORDER — IBUPROFEN 800 MG PO TABS: 800.0000 mg | ORAL_TABLET | Freq: Three times a day (TID) | ORAL | 0 refills | Status: DC

## 2020-11-03 MED ORDER — OXYCODONE-ACETAMINOPHEN 5-325 MG PO TABS
1.0000 | ORAL_TABLET | ORAL | 0 refills | Status: DC | PRN
Start: 2020-11-03 — End: 2021-06-03

## 2020-11-03 NOTE — Lactation Note (Signed)
This note was copied from a baby's chart. Lactation Consultation Note Attempted to see mom. Everyone in room sleeping.  Patient Name: Jennifer Lynn Date: 11/03/2020     Maternal Data    Feeding Feeding Type: Breast Milk with Formula added Nipple Type: Slow - flow  LATCH Score                   Interventions    Lactation Tools Discussed/Used     Consult Status      Kiani Wurtzel, Diamond Nickel 11/03/2020, 6:43 AM

## 2020-11-03 NOTE — Lactation Note (Signed)
This note was copied from a baby's chart. Lactation Consultation Note  Patient Name: Jennifer Lynn Today's Date: 11/03/2020   Spoke with Lanice Schwab, RN who reports parents do not want to see lactation today prior to discharge.  Maternal Data    Feeding Feeding Type: Breast Fed  [facility] Score                   Interventions    Lactation Tools Discussed/Used     Consult Status      Alek Borges Michaelle Copas 11/03/2020, 11:42 AM

## 2020-11-04 ENCOUNTER — Telehealth: Payer: Self-pay

## 2020-11-04 NOTE — Telephone Encounter (Signed)
Transition Care Management Follow-up Telephone Call  Date of discharge and from where: 11/03/2020 [facility]  How have you been since you were released from the hospital? Doing alright.   Any questions or concerns? No  Items Reviewed:  Did the pt receive and understand the discharge instructions provided? Yes   Medications obtained and verified? Yes   Other? No   Any new allergies since your discharge? No   Dietary orders reviewed? Yes  Do you have support at home? Yes   Home Care and Equipment/Supplies: Were home health services ordered? not applicable If so, what is the name of the agency? na  Has the agency set up a time to come to the patient's home? not applicable Were any new equipment or medical supplies ordered?  No What is the name of the medical supply agency? na Were you able to get the supplies/equipment? not applicable Do you have any questions related to the use of the equipment or supplies? No  Functional Questionnaire: (I = Independent and D = Dependent) ADLs: I  Bathing/Dressing- I  Meal Prep- I  Eating- I  Maintaining continence- I  Transferring/Ambulation- I  Managing Meds- I  Follow up appointments reviewed:   PCP Hospital f/u appt confirmed? Yes  Scheduled to see Raliegh Ip, NP on 12/10/2020 @ 1PM.  Specialist [facility] f/u appt confirmed? Yes  Scheduled to see Merian Capron, MD on 12/02/2020 @ 10:15am.  Are transportation arrangements needed? No   If their condition worsens, is the pt aware to call PCP or go to the Emergency Dept.? Yes  Was the patient provided with contact information for the PCP's office or ED? Yes  Was to pt encouraged to call back with questions or concerns? Yes

## 2020-11-10 ENCOUNTER — Ambulatory Visit: Payer: Self-pay

## 2020-12-02 ENCOUNTER — Other Ambulatory Visit: Payer: Self-pay

## 2020-12-02 ENCOUNTER — Ambulatory Visit (INDEPENDENT_AMBULATORY_CARE_PROVIDER_SITE_OTHER): Payer: Medicaid Other | Admitting: Family Medicine

## 2020-12-02 ENCOUNTER — Encounter: Payer: Self-pay | Admitting: Family Medicine

## 2020-12-02 DIAGNOSIS — Z98891 History of uterine scar from previous surgery: Secondary | ICD-10-CM

## 2020-12-02 NOTE — Patient Instructions (Signed)
Cesarean Delivery, Care After This sheet gives you information about how to care for yourself after your procedure. Your health care provider may also give you more specific instructions. If you have problems or questions, contact your health care provider. What can I expect after the procedure? After the procedure, it is common to have:  A small amount of blood or clear fluid coming from the incision.  Some redness, swelling, and pain in your incision area.  Some abdominal pain and soreness.  Vaginal bleeding (lochia). Even though you did not have a vaginal delivery, you will still have vaginal bleeding and discharge.  Pelvic cramps.  Fatigue. You may have pain, swelling, and discomfort in the tissue between your vagina and your anus (perineum) if:  Your C-section was unplanned, and you were allowed to labor and push.  An incision was made in the area (episiotomy) or the tissue tore during attempted vaginal delivery. Follow these instructions at home: Incision care   Follow instructions from your health care provider about how to take care of your incision. Make sure you: ? Wash your hands with soap and water before you change your bandage (dressing). If soap and water are not available, use hand sanitizer. ? If you have a dressing, change it or remove it as told by your health care provider. ? Leave stitches (sutures), skin staples, skin glue, or adhesive strips in place. These skin closures may need to stay in place for 2 weeks or longer. If adhesive strip edges start to loosen and curl up, you may trim the loose edges. Do not remove adhesive strips completely unless your health care provider tells you to do that.  Check your incision area every day for signs of infection. Check for: ? More redness, swelling, or pain. ? More fluid or blood. ? Warmth. ? Pus or a bad smell.  Do not take baths, swim, or use a hot tub until your health care provider says it's okay. Ask your health  care provider if you can take showers.  When you cough or sneeze, hug a pillow. This helps with pain and decreases the chance of your incision opening up (dehiscing). Do this until your incision heals. Medicines  Take over-the-counter and prescription medicines only as told by your health care provider.  If you were prescribed an antibiotic medicine, take it as told by your health care provider. Do not stop taking the antibiotic even if you start to feel better.  Do not drive or use heavy machinery while taking prescription pain medicine. Lifestyle  Do not drink alcohol. This is especially important if you are breastfeeding or taking pain medicine.  Do not use any products that contain nicotine or tobacco, such as cigarettes, e-cigarettes, and chewing tobacco. If you need help quitting, ask your health care provider. Eating and drinking  Drink at least 8 eight-ounce glasses of water every day unless told not to by your health care provider. If you breastfeed, you may need to drink even more water.  Eat high-fiber foods every day. These foods may help prevent or relieve constipation. High-fiber foods include: ? Whole grain cereals and breads. ? Brown rice. ? Beans. ? Fresh fruits and vegetables. Activity   If possible, have someone help you care for your baby and help with household activities for at least a few days after you leave the hospital.  Return to your normal activities as told by your health care provider. Ask your health care provider what activities are safe for   you.  Rest as much as possible. Try to rest or take a nap while your baby is sleeping.  Do not lift anything that is heavier than 10 lbs (4.5 kg), or the limit that you were told, until your health care provider says that it is safe.  Talk with your health care provider about when you can engage in sexual activity. This may depend on your: ? Risk of infection. ? How fast you heal. ? Comfort and desire to  engage in sexual activity. General instructions  Do not use tampons or douches until your health care provider approves.  Wear loose, comfortable clothing and a supportive and well-fitting bra.  Keep your perineum clean and dry. Wipe from front to back when you use the toilet.  If you pass a blood clot, save it and call your health care provider to discuss. Do not flush blood clots down the toilet before you get instructions from your health care provider.  Keep all follow-up visits for you and your baby as told by your health care provider. This is important. Contact a health care provider if:  You have: ? A fever. ? Bad-smelling vaginal discharge. ? Pus or a bad smell coming from your incision. ? Difficulty or pain when urinating. ? A sudden increase or decrease in the frequency of your bowel movements. ? More redness, swelling, or pain around your incision. ? More fluid or blood coming from your incision. ? A rash. ? Nausea. ? Little or no interest in activities you used to enjoy. ? Questions about caring for yourself or your baby.  Your incision feels warm to the touch.  Your breasts turn red or become painful or hard.  You feel unusually sad or worried.  You vomit.  You pass a blood clot from your vagina.  You urinate more than usual.  You are dizzy or light-headed. Get help right away if:  You have: ? Pain that does not go away or get better with medicine. ? Chest pain. ? Difficulty breathing. ? Blurred vision or spots in your vision. ? Thoughts about hurting yourself or your baby. ? New pain in your abdomen or in one of your legs. ? A severe headache.  You faint.  You bleed from your vagina so much that you fill more than one sanitary pad in one hour. Bleeding should not be heavier than your heaviest period. Summary  After the procedure, it is common to have pain at your incision site, abdominal cramping, and slight bleeding from your vagina.  Check  your incision area every day for signs of infection.  Tell your health care provider about any unusual symptoms.  Keep all follow-up visits for you and your baby as told by your health care provider. This information is not intended to replace advice given to you by your health care provider. Make sure you discuss any questions you have with your health care provider. Document Revised: 06/07/2018 Document Reviewed: 06/07/2018 Elsevier Patient Education  2020 Elsevier Inc.  

## 2020-12-02 NOTE — Progress Notes (Signed)
Post Partum Visit Note  HEER JUSTISS is a 24 y.o. [id number] female who presents for a postpartum visit. She is 4 weeks postpartum following a repeat cesarean section.  I have fully reviewed the prenatal and intrapartum course. The delivery was at 39 gestational weeks.  Anesthesia: spinal. Postpartum course has been unremarkable. Baby is doing well. Baby is feeding by bottle - [employer] Good Start. Bleeding no bleeding. Bowel function is normal. Bladder function is normal. Patient is not sexually active. Contraception method is none. Postpartum depression screening: negative.   The pregnancy intention screening data noted above was reviewed. Potential methods of contraception were discussed. The patient elected to proceed with No Method - Other Reason. Tracking her cycle, does not want birth control.     Edinburgh Postnatal Depression Scale - 12/02/20 1054      Edinburgh Postnatal Depression Scale:  In the Past 7 Days   I have been able to laugh and see the funny side of things. 0    I have looked forward with enjoyment to things. 0    I have blamed myself unnecessarily when things went wrong. 0    I have been anxious or worried for no good reason. 0    I have felt scared or panicky for no good reason. 0    Things have been getting on top of me. 2    I have been so unhappy that I have had difficulty sleeping. 0    I have felt sad or miserable. 0    I have been so unhappy that I have been crying. 0    The thought of harming myself has occurred to me. 0    Edinburgh Postnatal Depression Scale Total 2            The following portions of the patient's history were reviewed and updated as appropriate: allergies, current medications, past family history, past medical history, past social history, past surgical history and problem list.  Review of Systems Pertinent items noted in HPI and remainder of comprehensive ROS otherwise negative.    Objective:  BP 123/72   Pulse (!) 59    Ht 5\' 4"  (1.626 m)   Wt 204 lb 12.8 oz (92.9 kg)   LMP 02/01/2020 (Exact Date)   Breastfeeding No   BMI 35.15 kg/m    General:  alert, cooperative and appears stated age   Breasts:  deferred  Lungs: comfortable on room air  Heart:  regular rate and rhythm  Abdomen: soft, nontender, cesarean scar well healed without erythema or discharge   Vulva:  not evaluated  Vagina: not evaluated  Cervix:  not evaluated  Corpus: not examined  Adnexa:  not evaluated  Rectal Exam: Not performed.        Assessment:    Normal postpartum exam. Pap smear not done at today's visit, already up to date.   Plan:   Essential components of care per ACOG recommendations:  1.  Mood and well being: Patient with negative depression screening today. Reviewed local resources for support.   2. Infant care and feeding:  -Patient currently breastmilk feeding? No  -Social determinants of health (SDOH) reviewed in EPIC. No concerns  3. Sexuality, contraception and birth spacing - Patient does not want a pregnancy in the next year.  Desired family size is undecided.  - Reviewed forms of contraception in tiered fashion. Patient desired does not want any method due to friends having bad experiences. Discussed options, will do NFP  for present time, encouraged her to do additional methods such as temping to help with accuracy as well as barrier methods. - Discussed birth spacing of 18 months  4. Sleep and fatigue -Encouraged family/partner/community support of 4 hrs of uninterrupted sleep to help with mood and fatigue  5. Physical Recovery  - Discussed patients delivery and complications - uncomplicated cesarean - Patient has urinary incontinence? No - Patient is safe to resume physical and sexual activity at 6 weeks  6.  Health Maintenance - Last pap smear done 04/25/2020 and was normal.   Venora Maples, MD [facility], [facility]

## 2020-12-02 NOTE — Progress Notes (Signed)
Does not want to discuss Birth Control

## 2020-12-10 ENCOUNTER — Ambulatory Visit: Payer: Self-pay | Admitting: Family Medicine

## 2021-01-05 ENCOUNTER — Other Ambulatory Visit: Payer: Medicaid Other

## 2021-01-05 DIAGNOSIS — Z20822 Contact with and (suspected) exposure to covid-19: Secondary | ICD-10-CM | POA: Diagnosis not present

## 2021-01-06 LAB — NOVEL CORONAVIRUS, NAA: SARS-CoV-2, NAA: NOT DETECTED

## 2021-01-06 LAB — SARS-COV-2, NAA 2 DAY TAT

## 2021-01-13 ENCOUNTER — Ambulatory Visit: Payer: Medicaid Other | Admitting: Family Medicine

## 2021-05-28 ENCOUNTER — Ambulatory Visit (HOSPITAL_COMMUNITY)
Admission: EM | Admit: 2021-05-28 | Discharge: 2021-05-28 | Disposition: A | Discharge status: Home / Self Care | Payer: Medicaid Other | Attending: Emergency Medicine | Admitting: Emergency Medicine

## 2021-05-28 ENCOUNTER — Other Ambulatory Visit: Payer: Self-pay

## 2021-05-28 ENCOUNTER — Encounter (HOSPITAL_COMMUNITY): Payer: Self-pay

## 2021-05-28 DIAGNOSIS — N390 Urinary tract infection, site not specified: Secondary | ICD-10-CM | POA: Diagnosis not present

## 2021-05-28 LAB — POC URINE PREG, ED: Preg Test, Ur: NEGATIVE

## 2021-05-28 MED ORDER — CEPHALEXIN 500 MG PO CAPS: 500.0000 mg | ORAL_CAPSULE | Freq: Two times a day (BID) | ORAL | 0 refills | Status: AC

## 2021-05-28 NOTE — ED Triage Notes (Signed)
Pt reports burning when urinating x 1 month.

## 2021-05-28 NOTE — ED Provider Notes (Signed)
[facility]    CSN: [id number] Arrival date & time: 05/28/21  1957      History   Chief Complaint Chief Complaint  Patient presents with   Dysuria    HPI Jennifer Lynn is a 25 y.o. female.   Patient here for evaluation of dysuria that has been ongoing intermittently for the past month. Reports developing symptoms but state that they would resolve until several days ago.  Reports having similar symptoms with UTIs in the past.  Denies any vaginal pain or discharge.  Reports having some lower abdominal cramping and fullness and flank pain.  Denies any trauma, injury, or other precipitating event.  Denies any fevers, chest pain, shortness of breath, N/V/D, numbness, tingling, weakness, abdominal pain, or headaches.    The history is provided by the patient.  Dysuria Associated symptoms: flank pain   Associated symptoms: no vaginal discharge    Past Medical History:  Diagnosis Date   Anemia    Blood transfusion without reported diagnosis    patient thinks she had after previous delivery   Medical history non-contributory    UTI (urinary tract infection)     Patient Active Problem List   Diagnosis Date Noted   Supervision of high-risk pregnancy 10/31/2020   Cesarean delivery delivered 10/31/2020   Macrosomia affecting management of mother in third trimester, single gestation 10/23/2020   History of pre-eclampsia 10/17/2017   Alpha thalassemia trait 03/17/2017   Morbid obesity with BMI of 40.0-44.9, adult (HCC) 06/26/2015    Past Surgical History:  Procedure Laterality Date   CESAREAN SECTION N/A 09/16/2017   Procedure: CESAREAN SECTION;  Surgeon: [place] Bing, MD;  Location: [facility];  Service: Obstetrics;  Laterality: N/A;   CESAREAN SECTION N/A 10/31/2020   Procedure: CESAREAN SECTION;  Surgeon: Adam Phenix, MD;  Location: [facility] LD ORS;  Service: Obstetrics;  Laterality: N/A;    OB History     Gravida  2   Para  2   Term  2    Preterm  0   AB  0   Living  2      SAB  0   IAB  0   Ectopic  0   Multiple  0   Live Births  2            Home Medications    Prior to Admission medications   Medication Sig Start Date End Date Taking? Authorizing Provider  cephALEXin (KEFLEX) 500 MG capsule Take 1 capsule (500 mg total) by mouth 2 (two) times daily for 7 days. 05/28/21 06/04/21 Yes Ivette Loyal, NP  docusate sodium (COLACE) 100 MG capsule Take 1 capsule (100 mg total) by mouth 2 (two) times daily as needed. Patient taking differently: Take 100 mg by mouth 2 (two) times daily as needed (constipation.). 10/24/20   Aviva Signs, CNM  hydrocortisone-pramoxine (PROCTOFOAM [facility]) rectal foam Place 1 applicator rectally 2 (two) times daily. Patient taking differently: Place 1 applicator rectally 2 (two) times daily as needed for hemorrhoids. 10/24/20   Aviva Signs, CNM  ibuprofen (ADVIL) 800 MG tablet Take 1 tablet (800 mg total) by mouth every 8 (eight) hours. 11/03/20   Rolm Bookbinder, CNM  oxyCODONE-acetaminophen (PERCOCET/ROXICET) 5-325 MG tablet Take 1 tablet by mouth every 4 (four) hours as needed for severe pain. Patient not taking: No sig reported 11/03/20   Rolm Bookbinder, CNM  Prenatal Vit-Fe Fumarate-FA (PRENATAL COMPLETE) 14-0.4 MG TABS Take 1 tablet by mouth  daily. Patient not taking: No sig reported 04/25/20   Marny Lowenstein, PA-C  witch hazel-glycerin (TUCKS) pad Apply 1 application topically as needed for itching. Patient not taking: No sig reported    [provider]    Family History Family History  Problem Relation Age of Onset   Diabetes Maternal Grandmother    Diabetes Paternal Grandmother    Hypertension Mother    Hypertension Father     Social History Social History   Tobacco Use   Smoking status: Never   Smokeless tobacco: Never  Vaping Use   Vaping Use: Never used  Substance Use Topics   Alcohol use: Not Currently    Comment: occasionally     Drug use: No     Allergies   Patient has no known allergies.   Review of Systems Review of Systems  Genitourinary:  Positive for dysuria, flank pain, hematuria and urgency. Negative for frequency, vaginal bleeding, vaginal discharge and vaginal pain.    Physical Exam Triage Vital Signs ED Triage Vitals [05/28/21 2010]  Enc Vitals Group     BP      Pulse      Resp      Temp      Temp src      SpO2      Weight      Height      Head Circumference      Peak Flow      Pain Score 0     Pain Loc      Pain Edu?      Excl. in GC?    No data found.  Updated Vital Signs BP 118/81 (BP Location: Right Arm)   Pulse 60   Temp 98.8 F (37.1 C) (Oral)   Resp 18   LMP  (Within Weeks) Comment: 1 week  SpO2 96%   Visual Acuity Right Eye Distance:   Left Eye Distance:   Bilateral Distance:    Right Eye Near:   Left Eye Near:    Bilateral Near:     Physical Exam Vitals and nursing note reviewed.  Constitutional:      General: She is not in acute distress.    Appearance: Normal appearance. She is not ill-appearing, toxic-appearing or diaphoretic.  HENT:     Head: Normocephalic and atraumatic.  Eyes:     Conjunctiva/sclera: Conjunctivae normal.  Cardiovascular:     Rate and Rhythm: Normal rate.     Pulses: Normal pulses.  Pulmonary:     Effort: Pulmonary effort is normal.  Abdominal:     General: Abdomen is flat.     Tenderness: There is no right CVA tenderness or left CVA tenderness.  Genitourinary:    Comments: declines Musculoskeletal:        General: Normal range of motion.     Cervical back: Normal range of motion.  Skin:    General: Skin is warm and dry.  Neurological:     General: No focal deficit present.     Mental Status: She is alert and oriented to person, place, and time.  Psychiatric:        Mood and Affect: Mood normal.     UC Treatments / Results  Labs (all labs ordered are listed, but only abnormal results are displayed) Labs Reviewed   URINE CULTURE  POCT URINALYSIS DIPSTICK, ED / UC  POC URINE PREG, ED    EKG   Radiology No results found.  Procedures Procedures (including critical care time)  Medications Ordered in UC Medications - No data to display  Initial Impression / Assessment and Plan / UC Course  I have reviewed the triage vital signs and the nursing notes.  Pertinent labs & imaging results that were available during my care of the patient were reviewed by me and considered in my medical decision making (see chart for details).    Assessment negative for red flags or concerns.  Urinalysis positive for leukocytes and hemoglobin, urine culture pending.  Pregnancy test negative.  Will go ahead and treat with keflex twice a day for the next 7 days for possible UTI.  If urine culture is negative, patient may discontinue antibiotic use.  Encourages fluids and rest.  May take AZO, cranberry pills, pyridium, or drink cranberry juice for symptom management.  Follow up with primary care as needed.   Final Clinical Impressions(s) / UC Diagnoses   Final diagnoses:  Lower urinary tract infectious disease     Discharge Instructions      Take the Keflex twice a day for the next 7 days.    You can also take AZO, cranberry pills, Pyridium, or drink Cranberry juice for urinary symptom management.    Make sure you are drinking plenty of water.    If you urine culture is negative, you can stop taking the antibiotics.    Return or go to the Emergency Department if symptoms worsen or do not improve in the next few days.      ED Prescriptions     Medication Sig Dispense Auth. Provider   cephALEXin (KEFLEX) 500 MG capsule Take 1 capsule (500 mg total) by mouth 2 (two) times daily for 7 days. 14 capsule Ivette Loyal, NP      PDMP not reviewed this encounter.   Ivette Loyal, NP 05/28/21 2031

## 2021-05-28 NOTE — Discharge Instructions (Addendum)
Take the Keflex twice a day for the next 7 days.    You can also take AZO, cranberry pills, Pyridium, or drink Cranberry juice for urinary symptom management.    Make sure you are drinking plenty of water.    If you urine culture is negative, you can stop taking the antibiotics.    Return or go to the Emergency Department if symptoms worsen or do not improve in the next few days.

## 2021-05-31 LAB — URINE CULTURE: Culture: 80000 — AB

## 2021-06-01 ENCOUNTER — Encounter: Payer: Self-pay | Admitting: Family Medicine

## 2021-06-01 ENCOUNTER — Ambulatory Visit: Payer: Medicaid Other | Admitting: Family Medicine

## 2021-06-01 ENCOUNTER — Other Ambulatory Visit: Payer: Self-pay

## 2021-06-01 VITALS — BP 120/67 | HR 64 | Wt 228.2 lb

## 2021-06-01 DIAGNOSIS — G43109 Migraine with aura, not intractable, without status migrainosus: Secondary | ICD-10-CM | POA: Diagnosis not present

## 2021-06-01 MED ORDER — SUMATRIPTAN 20 MG/ACT NA SOLN: 20.0000 mg | NASAL | 0 refills | Status: DC | PRN

## 2021-06-01 NOTE — Patient Instructions (Signed)
It was great seeing you today!   I'd like to see you back July 8th at 3:50p but if you need to be seen earlier than that for any new issues we're happy to fit you in, just give [place] a call!  Regarding headache: Take 800 mg ibuprofen daily the week prior to your period. Then if you get a migraine take Sumatryptan 20 mg and repeat in 2 hours.   Consider HPV and COVID vaccines.   Regarding FMLA: bring paperwork to the office and likely will need neurology referral.    If you have questions or concerns please do not hesitate to call at [phone].  Dr. Katherina Right [facility]

## 2021-06-01 NOTE — Progress Notes (Signed)
   SUBJECTIVE:   CHIEF COMPLAINT / HPI:   Chief Complaint  Patient presents with   New Patient (Initial Visit)    Jennifer Lynn is a 25 y.o. female here for to establish care. Pt has several concerns (back pain, anxiety, irregular bleeding) today most notably migraines. She reports migraines are worse the week before her period and the week of her period. She has to miss work as she needs to being in a dark room. This change didn't start until after her last child in [place]. Every other month she bleeds from 3-5 days. Months where she has 3 day periods it is always bad. Passes large (quarter-plum) sized clots. It feels like she is in labor when passing clots. She wears super plus tampons and pads every 2 hours she must change her tampon.  She toolkOCPs prior to pregnancy but inconsistently due to weightgain.  Missed 3 days of work. Would like to get FMLA for chronic migraines. LMP 05/21/21. Declines birth control at this time due to weight gain side effects.    PERTINENT  PMH / PSH: reviewed :migraines, GERD, back pain, abnormal uterine bleeding, anxiety/depression, anemia, hx of irregular heartbeat during last pregnancy  OBJECTIVE:   BP 120/67   Pulse 64   Wt 228 lb 3.2 oz (103.5 kg)   SpO2 100%   BMI 39.17 kg/m    GEN: well appearing female, in no acute distress  CV: regular rate and rhythm, no murmurs appreciated  RESP: no increased work of breathing, clear to ascultation bilaterally ABD: Bowel sounds present. Soft, Nontender, Nondistended.  GU: Non-gravid appearing MSK: no edema, strength 5/5 bilateral upper and lower extremities  SKIN: warm, dry NEURO: CN II-X12 grossly intact, moves all extremities appropriately PSYCH: Normal affect, appropriate speech and behavior    ASSESSMENT/PLAN:   Migraine aura without headache Pt is a 25 yo female who reports headaches 2 weeks in the month concerning for menstrual headache. Pt would like FMLA for headaches. She is new pt to  me therefore discussed treatment and possible referral to neurology prior to completing FMLA paperwork.    COVID: decline  Td : given during recent pregnancy HPV: considering   Follow up 06/19/21 at 3:50p.   Katha Cabal, DO PGY-2, [facility] 06/01/2021

## 2021-06-03 DIAGNOSIS — K219 Gastro-esophageal reflux disease without esophagitis: Secondary | ICD-10-CM | POA: Insufficient documentation

## 2021-06-03 DIAGNOSIS — G43109 Migraine with aura, not intractable, without status migrainosus: Secondary | ICD-10-CM | POA: Insufficient documentation

## 2021-06-03 DIAGNOSIS — D649 Anemia, unspecified: Secondary | ICD-10-CM | POA: Insufficient documentation

## 2021-06-03 MED ORDER — SUMATRIPTAN SUCCINATE 25 MG PO TABS: 25.0000 mg | ORAL_TABLET | ORAL | 0 refills | Status: DC | PRN

## 2021-06-03 NOTE — Assessment & Plan Note (Addendum)
Pt is a 25 yo female who reports headaches 2 weeks in the month concerning for menstrual headache. Pt would like FMLA for headaches. She is new pt to me therefore discussed treatment and possible referral to neurology prior to completing FMLA paperwork. Had normal CT Head Oct 2019.

## 2021-06-08 DIAGNOSIS — Z20822 Contact with and (suspected) exposure to covid-19: Secondary | ICD-10-CM | POA: Diagnosis not present

## 2021-06-19 ENCOUNTER — Ambulatory Visit: Payer: Medicaid Other | Admitting: Family Medicine

## 2021-06-26 ENCOUNTER — Other Ambulatory Visit: Payer: Self-pay

## 2021-06-26 ENCOUNTER — Encounter: Payer: Self-pay | Admitting: Family Medicine

## 2021-06-26 ENCOUNTER — Ambulatory Visit (INDEPENDENT_AMBULATORY_CARE_PROVIDER_SITE_OTHER): Payer: Medicaid Other | Admitting: Family Medicine

## 2021-06-26 VITALS — HR 82 | Ht 64.0 in | Wt 225.6 lb

## 2021-06-26 DIAGNOSIS — G8929 Other chronic pain: Secondary | ICD-10-CM | POA: Insufficient documentation

## 2021-06-26 DIAGNOSIS — M546 Pain in thoracic spine: Secondary | ICD-10-CM

## 2021-06-26 DIAGNOSIS — Z23 Encounter for immunization: Secondary | ICD-10-CM

## 2021-06-26 MED ORDER — BACLOFEN 10 MG PO TABS: 5.0000 mg | ORAL_TABLET | Freq: Three times a day (TID) | ORAL | 0 refills | Status: DC | PRN

## 2021-06-26 NOTE — Assessment & Plan Note (Signed)
Suspect thoracic back pain is related to her large breast size.  Reports recent increase in breast size due to weight gain.  When lifting her breasts in her bra, pain is greatly improved.  Advised to get new bras.  Continue ibuprofen as needed.  Start low-dose baclofen at bedtime.  Follow-up if not improving.  No red flag symptoms identified.  ED precautions given.

## 2021-06-26 NOTE — Progress Notes (Signed)
   SUBJECTIVE:   CHIEF COMPLAINT / HPI:   Chief Complaint  Patient presents with   Back Pain    Pt stated that she started having back pain around Nov of last year      Jennifer Lynn is a 25 y.o. female here to discuss back pain. Report mid-thoracic back pain. Ibuprofen makes pain better. Movement can worsen pain and being on her period worsens lower back pain. Pain has been present for a while now. Reports she has gained weight and here breasts are bigger than previous. Denies loss of bowel and bladder control, dysuria, weight loss, night sweats, fever, numbness or extremity weakness.     PERTINENT  PMH / PSH: reviewed and updated as appropriate   OBJECTIVE:   Pulse 82   Ht 5\' 4"  (1.626 m)   Wt 225 lb 9.6 oz (102.3 kg)   LMP 06/14/2021   SpO2 98%   BMI 38.72 kg/m    GEN: well appearing female in no acute distress  CVS: well perfused  RESP: speaking in full sentences without pause, no respiratory distress  Lumbar spine: - Inspection: no gross deformity or asymmetry, swelling or ecchymosis. No skin changes - Palpation: No TTP over the C, T, L spinous processes, paraspinal muscles, or SI joints b/l - ROM: full active ROM of the lumbar spine in flexion and extension without pain - Strength: 5/5 strength of lower extremity in L4-S1 nerve root distributions b/l - Neuro: LE sensation intact  - Special testing: Negative straight leg raise bilaterally    ASSESSMENT/PLAN:   Chronic thoracic back pain Suspect thoracic back pain is related to her large breast size.  Reports recent increase in breast size due to weight gain.  When lifting her breasts in her bra, pain is greatly improved.  Advised to get new bras.  Continue ibuprofen as needed.  Start low-dose baclofen at bedtime.  Follow-up if not improving.  No red flag symptoms identified.  ED precautions given.   HPV and Tdap given.  08/15/2021, DO PGY-2, [facility] 06/26/2021

## 2021-06-26 NOTE — Patient Instructions (Addendum)
It was great seeing you today!  Please check-out at the front desk before leaving the clinic. I'd like to see you back in 3 months  but if you need to be seen earlier than that for any new issues we're happy to fit you in, just give [place] a call!  Visit Remembers: - Stop by the pharmacy to pick up your prescriptions  - Continue to work on your healthy eating habits and incorporating exercise into your daily life.  - For your upper-mid back pain: Get well fitting bras. Take Ibuprofen as needed. Take your first muscle relaxer at bedtime.  - Consider hormonal birth control  - Consider visiting herbal medicine shop in [place]    Please bring all of your medications with you to each visit.    If you haven't already, sign up for My Chart to have easy access to your labs results, and communication with your primary care physician.  Feel free to call with any questions or concerns at any time, at [phone].   Take care,  Dr. Katherina [facility]

## 2021-06-30 ENCOUNTER — Encounter: Payer: Self-pay | Admitting: Family Medicine

## 2021-09-23 ENCOUNTER — Telehealth: Payer: Self-pay | Admitting: Family Medicine

## 2021-09-23 DIAGNOSIS — M546 Pain in thoracic spine: Secondary | ICD-10-CM

## 2021-09-23 DIAGNOSIS — G8929 Other chronic pain: Secondary | ICD-10-CM

## 2021-09-23 NOTE — Telephone Encounter (Signed)
Patient called regarding referral that was suppose to be place for her back. From the visit on 06/26/2021, I didn't see a referral or where it was noted that it was discussed. Please advise and let patient know when referral is placed. Thanks!

## 2021-09-29 NOTE — Addendum Note (Signed)
Addended by: Katha Cabal D on: 09/29/2021 07:33 PM   Modules accepted: Orders

## 2021-10-21 ENCOUNTER — Other Ambulatory Visit: Payer: Self-pay

## 2021-11-23 ENCOUNTER — Other Ambulatory Visit: Payer: Self-pay

## 2021-11-23 ENCOUNTER — Ambulatory Visit (HOSPITAL_COMMUNITY)
Admission: EM | Admit: 2021-11-23 | Discharge: 2021-11-23 | Disposition: A | Discharge status: Home / Self Care | Payer: Medicaid Other | Attending: Family Medicine | Admitting: Family Medicine

## 2021-11-23 ENCOUNTER — Encounter (HOSPITAL_COMMUNITY): Payer: Self-pay | Admitting: Emergency Medicine

## 2021-11-23 DIAGNOSIS — J069 Acute upper respiratory infection, unspecified: Secondary | ICD-10-CM | POA: Insufficient documentation

## 2021-11-23 DIAGNOSIS — Z20822 Contact with and (suspected) exposure to covid-19: Secondary | ICD-10-CM | POA: Diagnosis not present

## 2021-11-23 DIAGNOSIS — R059 Cough, unspecified: Secondary | ICD-10-CM | POA: Diagnosis not present

## 2021-11-23 NOTE — Discharge Instructions (Signed)
Mucinex or nyquil as needed for cold symptoms. Staff will call you tomorrow by in the afternoon if the COVID test is positive. If positive, you will need to quarantine for 5 days.

## 2021-11-23 NOTE — ED Provider Notes (Signed)
[facility]    CSN: [id number] Arrival date & time: 11/23/21  1937      History   Chief Complaint Chief Complaint  Patient presents with   URI    HPI Jennifer Lynn is a 25 y.o. female.    URI Here for congestion and some headache that began this morning.  No fever or chills, no sore throat, no ear pain.  She has not had any shortness of breath.  She is not having any nausea vomiting or diarrhea.  Past medical history is negative her 72-year-old has similar symptoms plus fever.  And her 58-year-old has started having cough and congestion.  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 Bing, MD;  Location: [facility] BIRTHING SUITES;  Service: Obstetrics;  Laterality: N/A;   CESAREAN SECTION N/A 10/31/2020   Procedure: CESAREAN SECTION;  Surgeon: Adam Phenix, MD;  Location: [facility];  Service: Obstetrics;  Laterality: N/A;    OB History     Gravida  2   Para  2   Term  2   Preterm  0   AB  0   Living  2      SAB  0   IAB  0   Ectopic  0   Multiple  0   Live Births  2            Home Medications    Prior to Admission medications   Medication Sig Start Date End Date Taking? Authorizing Provider  baclofen (LIORESAL) 10 MG tablet  Take 0.5 tablets (5 mg total) by mouth 3 (three) times daily as needed for muscle spasms. Patient not taking: Reported on 11/23/2021 06/26/21   Katha Cabal, DO  docusate sodium (COLACE) 100 MG capsule Take 1 capsule (100 mg total) by mouth 2 (two) times daily as needed. Patient taking differently: Take 100 mg by mouth 2 (two) times daily as needed (constipation.). 10/24/20   Aviva Signs, CNM  hydrocortisone-pramoxine (PROCTOFOAM [facility]) rectal foam Place 1 applicator rectally 2 (two) times daily. Patient taking differently: Place 1 applicator rectally 2 (two) times daily as needed for hemorrhoids. 10/24/20   Aviva Signs, CNM  ibuprofen (ADVIL) 800 MG tablet Take 1 tablet (800 mg total) by mouth every 8 (eight) hours. 11/03/20   Rolm Bookbinder, CNM  Prenatal Vit-Fe Fumarate-FA (PRENATAL COMPLETE) 14-0.4 MG TABS Take 1 tablet by mouth daily. Patient not taking: No sig reported 04/25/20   Marny Lowenstein, PA-C  SUMAtriptan (IMITREX) 25 MG tablet Take 1-2 tablets (25-50 mg total) by mouth every 2 (two) hours as needed for migraine. May repeat in 2 hours if headache persists or recurs. Do not exceed 8 tablets daily. 06/03/21   Katha Cabal, DO  Family History Family History  Problem Relation Age of Onset   Diabetes Maternal Grandmother    Diabetes Paternal Grandmother    Hypertension Mother    Hypertension Father     Social History Social History   Tobacco Use   Smoking status: Never   Smokeless tobacco: Never  Vaping Use   Vaping Use: Never used  Substance Use Topics   Alcohol use: Not Currently    Comment: occasionally    Drug use: No     Allergies   Patient has no known allergies.   Review of Systems Review of Systems   Physical Exam Triage Vital Signs ED Triage Vitals  Enc Vitals Group     BP 11/23/21 2014 106/75     Pulse Rate 11/23/21 2014 80     Resp 11/23/21 2014 20     Temp 11/23/21 2014 98.5 F (36.9 C)     Temp Source 11/23/21 2014 Oral      SpO2 11/23/21 2014 98 %     Weight --      Height --      Head Circumference --      Peak Flow --      Pain Score 11/23/21 2011 4     Pain Loc --      Pain Edu? --      Excl. in GC? --    No data found.  Updated Vital Signs BP 106/75 (BP Location: Left Arm) Comment (BP Location): large cuff  Pulse 80   Temp 98.5 F (36.9 C) (Oral)   Resp 20   LMP 11/15/2021   SpO2 98%   Visual Acuity Right Eye Distance:   Left Eye Distance:   Bilateral Distance:    Right Eye Near:   Left Eye Near:    Bilateral Near:     Physical Exam Vitals reviewed.  Constitutional:      General: She is not in acute distress.    Appearance: She is not toxic-appearing.  HENT:     Right Ear: Tympanic membrane and ear canal normal.     Left Ear: Tympanic membrane and ear canal normal.     Nose: Nose normal.     Mouth/Throat:     Mouth: Mucous membranes are moist.     Pharynx: Oropharyngeal exudate (clear dc in posterior OP) and posterior oropharyngeal erythema (mild) present.  Eyes:     Extraocular Movements: Extraocular movements intact.     Conjunctiva/sclera: Conjunctivae normal.     Pupils: Pupils are equal, round, and reactive to light.  Cardiovascular:     Rate and Rhythm: Normal rate and regular rhythm.     Heart sounds: No murmur heard. Pulmonary:     Effort: Pulmonary effort is normal.     Breath sounds: Normal breath sounds. No wheezing or rhonchi.  Musculoskeletal:     Cervical back: Neck supple.  Lymphadenopathy:     Cervical: No cervical adenopathy.  Skin:    Coloration: Skin is not jaundiced or pale.  Neurological:     General: No focal deficit present.     Mental Status: She is alert and oriented to person, place, and time.  Psychiatric:        Behavior: Behavior normal.     UC Treatments / Results  Labs (all labs ordered are listed, but only abnormal results are displayed) Labs Reviewed  SARS CORONAVIRUS 2 (TAT 6-24 HRS)    EKG   Radiology No results  found.  Procedures Procedures (including critical  care time)  Medications Ordered in UC Medications - No data to display  Initial Impression / Assessment and Plan / UC Course  I have reviewed the triage vital signs and the nursing notes.  Pertinent labs & imaging results that were available during my care of the patient were reviewed by me and considered in my medical decision making (see chart for details).     With no fever, we will only do the covid swab. Over the counter nyquil or mucinex as needed.  Final Clinical Impressions(s) / UC Diagnoses   Final diagnoses:  Viral upper respiratory tract infection     Discharge Instructions      Mucinex or nyquil as needed for cold symptoms. Staff will call you tomorrow by in the afternoon if the COVID test is positive. If positive, you will need to quarantine for 5 days.     ED Prescriptions   None    PDMP not reviewed this encounter.   Zenia Resides, MD 11/23/21 2053

## 2021-11-23 NOTE — ED Triage Notes (Signed)
Headache and congestion for one day

## 2021-11-24 LAB — SARS CORONAVIRUS 2 (TAT 6-24 HRS): SARS Coronavirus 2: NEGATIVE

## 2021-12-10 ENCOUNTER — Other Ambulatory Visit: Payer: Self-pay

## 2021-12-10 ENCOUNTER — Ambulatory Visit: Payer: Medicaid Other | Admitting: Plastic Surgery

## 2021-12-10 ENCOUNTER — Encounter: Payer: Self-pay | Admitting: Plastic Surgery

## 2021-12-10 VITALS — BP 112/69 | HR 77 | Ht 64.0 in | Wt 218.8 lb

## 2021-12-10 DIAGNOSIS — M545 Low back pain, unspecified: Secondary | ICD-10-CM

## 2021-12-10 DIAGNOSIS — Z411 Encounter for cosmetic surgery: Secondary | ICD-10-CM

## 2021-12-10 DIAGNOSIS — M4004 Postural kyphosis, thoracic region: Secondary | ICD-10-CM

## 2021-12-10 DIAGNOSIS — M546 Pain in thoracic spine: Secondary | ICD-10-CM | POA: Diagnosis not present

## 2021-12-10 NOTE — Progress Notes (Signed)
Referring Provider Katha Cabal, DO [address] [place],  [place] [address]   CC:  Chief Complaint  Patient presents with   Advice Only      Jennifer Lynn is an 25 y.o. female.  HPI: Patient presents as a referral for back pain.  Since her recent pregnancy and childbirth she has had quite a bit of pain in her back.  She says actually her breasts have decreased in size since childbirth and breast-feeding and she does not want to be much smaller.  She does note that her breasts are much lower on her chest than they were previously and would be interested in having them lifted.  She has not yet been to physical therapy for back pain.  She is currently around a C cup and wants to be the same size or bigger.  No family history of breast cancer no previous breast biopsies or procedures.  She does not smoke and is not diabetic.  No Known Allergies  Outpatient Encounter Medications as of 12/10/2021  Medication Sig   baclofen (LIORESAL) 10 MG tablet Take 0.5 tablets (5 mg total) by mouth 3 (three) times daily as needed for muscle spasms.   ibuprofen (ADVIL) 800 MG tablet Take 1 tablet (800 mg total) by mouth every 8 (eight) hours.   [DISCONTINUED] docusate sodium (COLACE) 100 MG capsule Take 1 capsule (100 mg total) by mouth 2 (two) times daily as needed. (Patient taking differently: Take 100 mg by mouth 2 (two) times daily as needed (constipation.).)   [DISCONTINUED] hydrocortisone-pramoxine (PROCTOFOAM [facility]) rectal foam Place 1 applicator rectally 2 (two) times daily. (Patient taking differently: Place 1 applicator rectally 2 (two) times daily as needed for hemorrhoids.)   [DISCONTINUED] Prenatal Vit-Fe Fumarate-FA (PRENATAL COMPLETE) 14-0.4 MG TABS Take 1 tablet by mouth daily. (Patient not taking: No sig reported)   [DISCONTINUED] SUMAtriptan (IMITREX) 25 MG tablet Take 1-2 tablets (25-50 mg total) by mouth every 2 (two) hours as needed for migraine. May repeat in 2 hours if headache  persists or recurs. Do not exceed 8 tablets daily. (Patient not taking: Reported on 12/10/2021)   No facility-administered encounter medications on file as of 12/10/2021.     Past Medical History:  Diagnosis Date   Anemia    Blood transfusion without reported diagnosis    patient thinks she had after previous delivery   Medical history non-contributory    UTI (urinary tract infection)     Past Surgical History:  Procedure Laterality Date   CESAREAN SECTION N/A 09/16/2017   Procedure: CESAREAN SECTION;  Surgeon: [place] Bing, MD;  Location: [facility] BIRTHING SUITES;  Service: Obstetrics;  Laterality: N/A;   CESAREAN SECTION N/A 10/31/2020   Procedure: CESAREAN SECTION;  Surgeon: Adam Phenix, MD;  Location: [facility];  Service: Obstetrics;  Laterality: N/A;    Family History  Problem Relation Age of Onset   Diabetes Maternal Grandmother    Diabetes Paternal Grandmother    Hypertension Mother    Hypertension Father     Social History   Social History Narrative   Not on file     Review of Systems General: Denies fevers, chills, weight loss CV: Denies chest pain, shortness of breath, palpitations  Physical Exam Vitals with BMI 12/10/2021 11/23/2021 06/26/2021  Height 5\' 4"  - 5\' 4"   Weight 218 lbs 13 oz - 225 lbs 10 oz  BMI 37.54 - 38.7  Systolic 112 106 -  Diastolic 69 75 -  Pulse 77 80 82  General:  No acute distress,  Alert and oriented, Non-Toxic, Normal speech and affect Breast: She has grade 3 ptosis.  Sternal notch to nipple is 31 cm on the right 32 cm on the left.  Nipple to fold is 12 cm bilaterally.  No obvious scars or masses.  Assessment/Plan From the standpoint of the patient's back pain I will plan to send her to physical therapy.  If that does not work I be happy to refer her to an orthopedic specialist.  It seems like the mid back is the focus of her pain and that is her primary objective.  Regarding the breast she would be interested in a breast lift  and she would also be interested in fat grafting to the breast to add additional volume.  I explained the details of that procedure to her and she is interested in a quote for that just to have a ballpark estimate for if she would be interested in that down the line.  All of her questions were answered and we will plan to provide that for her and see how she does with physical therapy.  [name] 12/10/2021, 3:52 PM

## 2021-12-29 ENCOUNTER — Ambulatory Visit: Payer: Medicaid Other

## 2021-12-29 ENCOUNTER — Other Ambulatory Visit: Payer: Self-pay

## 2022-01-01 ENCOUNTER — Encounter: Payer: Self-pay | Admitting: Physical Therapy

## 2022-01-01 ENCOUNTER — Other Ambulatory Visit: Payer: Self-pay

## 2022-01-01 ENCOUNTER — Ambulatory Visit: Payer: Medicaid Other | Attending: Plastic Surgery | Admitting: Physical Therapy

## 2022-01-01 DIAGNOSIS — M546 Pain in thoracic spine: Secondary | ICD-10-CM | POA: Insufficient documentation

## 2022-01-01 DIAGNOSIS — G8929 Other chronic pain: Secondary | ICD-10-CM | POA: Diagnosis not present

## 2022-01-01 DIAGNOSIS — M545 Low back pain, unspecified: Secondary | ICD-10-CM | POA: Diagnosis not present

## 2022-01-01 DIAGNOSIS — M6281 Muscle weakness (generalized): Secondary | ICD-10-CM | POA: Insufficient documentation

## 2022-01-01 NOTE — Therapy (Addendum)
OUTPATIENT PHYSICAL THERAPY THORACOLUMBAR EVALUATION   Patient Name: Jennifer Lynn MRN: [id number] DOB:May 31, 1996, 26 y.o., female Today's Date: 01/01/2022   PT End of Session - 01/01/22 1145     Visit Number 1    Number of Visits 12    Date for PT Re-Evaluation 02/12/22    PT Start Time 1015    PT Stop Time 1102    PT Time Calculation (min) 47 min    Activity Tolerance Patient tolerated treatment well    Behavior During Therapy WFL for tasks assessed/performed             Past Medical History:  Diagnosis Date   Anemia    Blood transfusion without reported diagnosis    patient thinks she had after previous delivery   Medical history non-contributory    UTI (urinary tract infection)    Past Surgical History:  Procedure Laterality Date   CESAREAN SECTION N/A 09/16/2017   Procedure: CESAREAN SECTION;  Surgeon: [place] Bing, MD;  Location: [facility] BIRTHING SUITES;  Service: Obstetrics;  Laterality: N/A;   CESAREAN SECTION N/A 10/31/2020   Procedure: CESAREAN SECTION;  Surgeon: Adam Phenix, MD;  Location: [facility];  Service: Obstetrics;  Laterality: N/A;   Patient Active Problem List   Diagnosis Date Noted   Chronic thoracic back pain 06/26/2021   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   Anemia 06/03/2021   GERD (gastroesophageal reflux disease) 06/03/2021   History of pre-eclampsia 10/17/2017   Alpha thalassemia trait 03/17/2017   Morbid obesity with BMI of 40.0-44.9, adult (HCC) 06/26/2015    PCP: Katha Cabal, DO  REFERRING PROVIDER: [name], MD  REFERRING DIAG: postural kyphosis, thoracic pain, lumbar pain  THERAPY DIAG:  Pain in thoracic spine  Chronic bilateral low back pain, unspecified whether sciatica present  Muscle weakness (generalized)  ONSET DATE: 6-12 mos   SUBJECTIVE:                                                                                                                                                                                            SUBJECTIVE STATEMENT: Patient has had back and neck pain for over 6 mos.  She reports her back going out everyday.  It happens with and without activity (stairs, sitting, standing).  Her DO thinks she may have excess breast tissue and this may be causing the problems.  She does not think this is the problem.   She has frequent muscle spasms, but denies weakness or radiating pain into extremities. She has 2 children, ages 28 and 1.  She has had problems with her back since her 2nd  childbirth.  She had C section x 2 .  It is difficult to lift from the floor or even do basic ADLs.  She does report being hyper-flexible in her spine when she was a teenager.   PERTINENT HISTORY:  None relevant  PAIN:  Are you having pain? Yes NPRS scale: 6/10 Pain location: lower middle back  Pain orientation: Bilateral, Upper, and Lower  PAIN TYPE: throbbing and tight Pain description: constant and tight  like she wants to pop it into place  Aggravating factors: random  Relieving factors: walking, stretching, medicine, heating pad and baths   PRECAUTIONS: None  WEIGHT BEARING RESTRICTIONS No  FALLS:  Has patient fallen in last 6 months? 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    PATIENT GOALS I want to get my back right.  I don't want surgery. Just started going to the gym.  She has slowed down due to pain , does more stretching at this point .    OBJECTIVE:   DIAGNOSTIC FINDINGS:  None XR done   COGNITION:  Overall cognitive status: Within functional limits for tasks assessed     SENSATION:  Light touch: Appears intact  Stereognosis: Appears intact  Hot/Cold: Appears  intact  Proprioception: Appears intact  MUSCLE LENGTH: Hamstrings: not tight Thomas test: not tight  POSTURE:  Genu recurvatum, anterior pelvic tilt/sway back, forward head, mild kyphosis   PALPATION: Pain thoracic spine with P/A pressure and hypomobile.  HYPERMOBILE in cervical spine, lumbar spine  Muscle tension in paraspinals in T-L spine Upper traps, post cervicals sore  LUMBARAROM/PROM  A/PROM A/PROM  01/01/2022  Flexion Fingertips to floor   Extension Pain , WNL and excessive?  Right lateral flexion WNL no pain  Lt lateral flexion  WNL  Right rotation WNL  Left rotation WNL  ROM in trunk is excessive in all planes in testing   Cervical ROM  flexion WNL  extension WNL  Rt lateral flexion 45  Lt lateral flexion 55  Rt rot WNL  Lt rot  WNL    UE strength shoulder flexion, abduction bilateral 4/5.    LE AROM/PROM:  A/PROM Right 01/01/2022 Left 01/01/2022  Hip flexion    Hip extension    Hip abduction    Hip adduction    Hip internal rotation    Hip external rotation    Knee flexion    Knee extension    Ankle dorsiflexion    Ankle plantarflexion    Ankle inversion    Ankle eversion     (Blank rows = not tested)  LE MMT:  MMT Right 01/01/2022 Left 01/01/2022  Hip flexion 4/5 4/5  Hip extension 4/5 4/5  Hip abduction 4/5 4/5  Hip adduction    Hip internal rotation    Hip external rotation    Knee flexion 5/5 5/5  Knee extension 5/5 5/5  Ankle dorsiflexion    Ankle plantarflexion    Ankle inversion    Ankle eversion     (Blank rows = not tested)  LUMBAR SPECIAL TESTS:  Prone instability test: Negative, Straight leg raise test: Negative, and Slump test: Negative  FUNCTIONAL TESTS:  NT    GAIT: Distance walked: 150 Assistive device utilized: None Level of assistance:  Complete Independence Comments: No deviations     TODAY'S TREATMENT  PT eval, pt ed.    PATIENT EDUCATION:  Education details: spine mobility, eval findings, joint  mobility in spine, hypermobility , differential diagnosis , dry needling  Person educated: Patient Education method: Explanation, Demonstration, Tactile cues, and Verbal cues Education comprehension: verbalized understanding and needs further education   HOME EXERCISE PROGRAM: Access Code: [email or web address] URL: [email or web address] Date: 01/01/2022 Prepared by: Karie MainlandJennifer Kevyn Boquet  Exercises Quadruped Cat with Posterior Pelvic Tilt - 2 x daily - 7 x weekly - 1 sets - 10 reps - 30 hold Bird Dog - 2 x daily - 7 x weekly - 2 sets - 10 reps - 5 hold   ASSESSMENT:  CLINICAL IMPRESSION: Patient is a 26 y.o. female who was seen today for physical therapy evaluation and treatment for back pain (neck, middle and lower) which is likely due to joint hypermobility and core weakness. Objective impairments include decreased activity tolerance, decreased mobility, decreased strength, hypomobility, increased fascial restrictions, impaired flexibility, postural dysfunction, pain, and hypermobility . She has excessive joint mobility in lumbar and cervical and relative stiffness in thoracic spine. These impairments are limiting patient from cleaning, community activity, driving, occupation, and childcare . Personal factors including Time since onset of injury/illness/exacerbation are also affecting patient's functional outcome. Patient will benefit from skilled PT to address above impairments and improve overall function.  REHAB POTENTIAL: Excellent  CLINICAL DECISION MAKING: Evolving/moderate complexity  EVALUATION COMPLEXITY: Moderate   GOALS: Goals reviewed with patient? No    LONG TERM GOALS:   LTG Name Target Date Goal status  1 Pt will be I with HEP for spine mobility and core/limb strength  Baseline: unknown, given on eval  02/12/2022 INITIAL  2 Pt will reduce muscle spasm in back to 1-2 per week  Baseline:1-2 per day  02/12/2022 INITIAL  3 Pt will be able to report less need to reposition  during work day, pain  2/10 Baseline: pain mod to severe, repositions frequently 02/12/2022 INITIAL  4 Pt will be able to improve UE and LE strength to 5/5 in hips/shoulders to maximize stability  Baseline: 02/12/2022 INITIAL  5 Pt will be able to complete gym/core exercises without increased back pain  Baseline: 02/12/2022 INITIAL             PLAN: PT FREQUENCY: 2x/week  PT DURATION: 6 weeks  PLANNED INTERVENTIONS: Therapeutic exercises, Therapeutic activity, Neuro Muscular re-education, Balance training, Patient/Family education, Joint mobilization, Dry Needling, Spinal mobilization, Cryotherapy, Moist heat, Taping, Manual therapy, and Pilates  PLAN FOR NEXT SESSION: check HEP (quadruped) and progress core. Manual to thoracic spine. DN paraspinals, upper trap?    Jamiyla Ishee 01/01/2022, 1:06 PM   Check all possible CPT codes: [id number]- Therapeutic Exercise, [phone]- Neuro Re-education, 97140 - Manual Therapy, 97530 - Therapeutic Activities, 97535 - Self Care, 97014 - Electrical stimulation (unattended), and 97750 - Physical performance training

## 2022-01-07 ENCOUNTER — Ambulatory Visit (HOSPITAL_COMMUNITY)
Admission: EM | Admit: 2022-01-07 | Discharge: 2022-01-07 | Disposition: A | Discharge status: Home / Self Care | Payer: Medicaid Other | Attending: Internal Medicine | Admitting: Internal Medicine

## 2022-01-07 ENCOUNTER — Encounter (HOSPITAL_COMMUNITY): Payer: Self-pay | Admitting: Emergency Medicine

## 2022-01-07 DIAGNOSIS — M62830 Muscle spasm of back: Secondary | ICD-10-CM | POA: Diagnosis not present

## 2022-01-07 DIAGNOSIS — G8929 Other chronic pain: Secondary | ICD-10-CM

## 2022-01-07 MED ORDER — BACLOFEN 10 MG PO TABS: 5.0000 mg | ORAL_TABLET | Freq: Every evening | ORAL | 0 refills | Status: DC | PRN

## 2022-01-07 NOTE — ED Provider Notes (Signed)
[facility]    CSN: [id number] Arrival date & time: 01/07/22  1822      History   Chief Complaint Chief Complaint  Patient presents with   Back Pain   Neck Pain    HPI Jennifer Lynn is a 26 y.o. female with a history of chronic back pain currently undergoing physical therapy comes to the urgent care with fairly sudden onset painful spasms over the back.  This started about 6 hours ago.  It is of moderate severity with no known relieving factors.  Patient denies any aggravating factors.  Not associated with any shortness of breath.  No numbness or tingling.  Patient denies falls or trauma to the back.  Patient is scheduled to go for the next section of physical therapy on 4 February.  She takes NSAIDs but has not filled her muscle relaxants. 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   Anemia 06/03/2021   GERD (gastroesophageal reflux disease) 06/03/2021   History of pre-eclampsia 10/17/2017   Alpha thalassemia trait 03/17/2017   Morbid obesity with BMI of 40.0-44.9, adult (HCC) 06/26/2015    Past Surgical History:  Procedure Laterality Date   CESAREAN SECTION N/A 09/16/2017   Procedure: CESAREAN SECTION;  Surgeon: [place] BingPickens, Charlie, MD;  [facility];  Service: Obstetrics;  Laterality: N/A;   CESAREAN SECTION N/A 10/31/2020   Procedure: CESAREAN SECTION;  Surgeon: Adam PhenixArnold, James G, MD;  Location: [facility];  Service: Obstetrics;  Laterality: N/A;    OB History     Gravida  2   Para  2   Term  2   Preterm  0   AB  0   Living  2      SAB  0   IAB  0   Ectopic  0   Multiple  0   Live Births  2             Home Medications    Prior to Admission medications   Medication Sig Start Date End Date Taking? Authorizing Provider  baclofen (LIORESAL) 10 MG tablet Take 0.5 tablets (5 mg total) by mouth at bedtime as needed for muscle spasms. 01/07/22   Merrilee JanskyLamptey, Jamale Spangler O, MD  ibuprofen (ADVIL) 800 MG tablet Take 1 tablet (800 mg total) by mouth every 8 (eight) hours. 11/03/20   Rolm BookbinderNeill, Caroline M, CNM    Family History Family History  Problem Relation Age of Onset   Diabetes Maternal Grandmother    Diabetes Paternal Grandmother    Hypertension Mother    Hypertension Father     Social History Social History   Tobacco Use   Smoking status: Never   Smokeless tobacco: Never  Vaping Use   Vaping Use: Never used  Substance Use Topics   Alcohol use: Not Currently    Comment: occasionally    Drug use: No     Allergies   Patient has no known allergies.   Review of Systems Review of Systems  Genitourinary: Negative.   Musculoskeletal:  Positive for back pain. Negative for arthralgias, joint swelling and myalgias.  Neurological: Negative.     Physical Exam  Triage Vital Signs ED Triage Vitals  Enc Vitals Group     BP 01/07/22 1915 116/77     Pulse Rate 01/07/22 1915 68     Resp 01/07/22 1915 18     Temp 01/07/22 1915 99.1 F (37.3 C)     Temp Source 01/07/22 1915 Oral     SpO2 01/07/22 1915 96 %     Weight --      Height --      Head Circumference --      Peak Flow --      Pain Score 01/07/22 1913 9     Pain Loc --      Pain Edu? --      Excl. in GC? --    No data found.  Updated Vital Signs BP 116/77 (BP Location: Left Arm)    Pulse 68    Temp 99.1 F (37.3 C) (Oral)    Resp 18    LMP 01/05/2022    SpO2 96%   Visual Acuity Right Eye Distance:   Left Eye Distance:   Bilateral Distance:    Right Eye Near:   Left Eye Near:    Bilateral Near:     Physical Exam Vitals and nursing note reviewed.  Constitutional:      General: She is in acute distress.      Appearance: She is not ill-appearing.  Cardiovascular:     Rate and Rhythm: Normal rate and regular rhythm.     Pulses: Normal pulses.     Heart sounds: Normal heart sounds.  Pulmonary:     Effort: Pulmonary effort is normal.     Breath sounds: Normal breath sounds.  Musculoskeletal:        General: Tenderness present. No swelling or deformity. Normal range of motion.  Skin:    General: Skin is warm.  Neurological:     Mental Status: She is alert.     UC Treatments / Results  Labs (all labs ordered are listed, but only abnormal results are displayed) Labs Reviewed - No data to display  EKG   Radiology No results found.  Procedures Procedures (including critical care time)  Medications Ordered in UC Medications - No data to display  Initial Impression / Assessment and Plan / UC Course  I have reviewed the triage vital signs and the nursing notes.  Pertinent labs & imaging results that were available during my care of the patient were reviewed by me and considered in my medical decision making (see chart for details).     1.  Muscle spasms of back: Gentle range of motion exercises Baclofen as needed for muscle spasms Warm bath or heating pad use Patient is advised to reach out to the physical therapy team sooner than the next scheduled appointment No indication for imaging at this time Return to urgent care if symptoms worsen. Final Clinical Impressions(s) / UC Diagnoses   Final diagnoses:  Muscle spasm of back     Discharge Instructions      Gentle range of motion exercises Gentle stretching will help with muscle spasm and stiffness in the back Take medications as prescribed Heating pad use on a 20-minute on-20 minutes off cycle will help with muscle spasms in the back Follow-up with your physical therapist sooner if symptoms does not improve Return to urgent care if symptoms worsen.   ED Prescriptions     Medication Sig Dispense Auth. Provider    baclofen (LIORESAL) 10 MG tablet Take 0.5 tablets (5  mg total) by mouth at bedtime as needed for muscle spasms. 20 each Keerthana Vanrossum, Britta Mccreedy, MD      PDMP not reviewed this encounter.   Merrilee Jansky, MD 01/07/22 2008

## 2022-01-07 NOTE — ED Triage Notes (Signed)
Pt c/o pain in neck to bottom of back that is ongoing and reports currently being treated for it. Reports having more spasms than normal.

## 2022-01-07 NOTE — Discharge Instructions (Addendum)
Gentle range of motion exercises Gentle stretching will help with muscle spasm and stiffness in the back Take medications as prescribed Heating pad use on a 20-minute on-20 minutes off cycle will help with muscle spasms in the back Follow-up with your physical therapist sooner if symptoms does not improve Return to urgent care if symptoms worsen.

## 2022-01-16 ENCOUNTER — Other Ambulatory Visit: Payer: Self-pay

## 2022-01-16 ENCOUNTER — Ambulatory Visit: Payer: Medicaid Other | Attending: Plastic Surgery

## 2022-01-16 DIAGNOSIS — M545 Low back pain, unspecified: Secondary | ICD-10-CM | POA: Diagnosis not present

## 2022-01-16 DIAGNOSIS — M6281 Muscle weakness (generalized): Secondary | ICD-10-CM | POA: Diagnosis not present

## 2022-01-16 DIAGNOSIS — G8929 Other chronic pain: Secondary | ICD-10-CM | POA: Diagnosis not present

## 2022-01-16 DIAGNOSIS — M546 Pain in thoracic spine: Secondary | ICD-10-CM | POA: Diagnosis not present

## 2022-01-16 NOTE — Therapy (Signed)
OUTPATIENT PHYSICAL THERAPY TREATMENT NOTE   Patient Name: Jennifer Lynn MRN: [id number] DOB:Oct 28, 1996, 26 y.o., female Today's Date: 01/16/2022  PCP: Lyndee Hensen, DO REFERRING PROVIDER: Lyndee Hensen, DO   PT End of Session - 01/16/22 1122     Visit Number 2    Number of Visits 12    Date for PT Re-Evaluation 02/12/22    Authorization Type [employer]    Authorization Time Period Pending Auth    PT Start Time 1122    PT Stop Time 1200    PT Time Calculation (min) 38 min    Activity Tolerance Patient tolerated treatment well    Behavior During Therapy WFL for tasks assessed/performed             Past Medical History:  Diagnosis Date   Anemia    Blood transfusion without reported diagnosis    patient thinks she had after previous delivery   Medical history non-contributory    UTI (urinary tract infection)    Past Surgical History:  Procedure Laterality Date   CESAREAN SECTION N/A 09/16/2017   Procedure: CESAREAN SECTION;  Surgeon: Aletha Halim, MD;  Location: [place];  Service: Obstetrics;  Laterality: N/A;   CESAREAN SECTION N/A 10/31/2020   Procedure: CESAREAN SECTION;  Surgeon: Woodroe Mode, MD;  Location: [facility];  Service: Obstetrics;  Laterality: N/A;   Patient Active Problem List   Diagnosis Date Noted   Chronic thoracic back pain 06/26/2021   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   Anemia 06/03/2021   GERD (gastroesophageal reflux disease) 06/03/2021   History of pre-eclampsia 10/17/2017   Alpha thalassemia trait 03/17/2017   Morbid obesity with BMI of 40.0-44.9, adult ([place]) 06/26/2015    REFERRING DIAG: postural kyphosis, thoracic pain, lumbar pain  THERAPY DIAG:  Pain in thoracic spine  Chronic bilateral low back pain, unspecified whether sciatica present  Muscle weakness (generalized)  SUBJECTIVE: Pt reports that she continues to have mid-back pain. She reports that she went to the ED last Thursday for  severe back pain, where she was prescribed muscle relaxers, although she reports declining this medication. She also reports adherence to her early HEP.  PAIN:  Are you having pain? Yes NPRS scale: 5-6/10 Pain location: Mid back PAIN TYPE: aching and tight Pain description: intermittent  Aggravating factors: Prolonged sitting Relieving factors: walking     OBJECTIVE:   *Unless otherwise noted, objective information collected previously* DIAGNOSTIC FINDINGS:  None XR done    COGNITION:          Overall cognitive status: Within functional limits for tasks assessed                        SENSATION:          Light touch: Appears intact          Stereognosis: Appears intact          Hot/Cold: Appears intact          Proprioception: Appears intact   MUSCLE LENGTH: Hamstrings: not tight Thomas test: not tight   POSTURE:  Genu recurvatum, anterior pelvic tilt/sway back, forward head, mild kyphosis     PALPATION: Pain thoracic spine with P/A pressure and hypomobile.  HYPERMOBILE in cervical spine, lumbar spine  Muscle tension in paraspinals in T-L spine Upper traps, post cervicals sore   LUMBARAROM/PROM   A/PROM A/PROM  01/01/2022  Flexion Fingertips to floor   Extension Pain , WNL and excessive?  Right lateral flexion WNL no pain  Lt lateral flexion  WNL  Right rotation WNL  Left rotation WNL  ROM in trunk is excessive in all planes in testing    Cervical ROM  flexion WNL  extension WNL  Rt lateral flexion 45  Lt lateral flexion 55  Rt rot WNL  Lt rot  WNL      UE strength shoulder flexion, abduction bilateral 4/5.      LE AROM/PROM:   A/PROM Right 01/01/2022 Left 01/01/2022  Hip flexion      Hip extension      Hip abduction      Hip adduction      Hip internal rotation      Hip external rotation      Knee flexion      Knee extension      Ankle dorsiflexion      Ankle plantarflexion      Ankle inversion      Ankle eversion       (Blank rows = not  tested)   LE MMT:   MMT Right 01/01/2022 Left 01/01/2022  Hip flexion 4/5 4/5  Hip extension 4/5 4/5  Hip abduction 4/5 4/5  Hip adduction      Hip internal rotation      Hip external rotation      Knee flexion 5/5 5/5  Knee extension 5/5 5/5  Ankle dorsiflexion      Ankle plantarflexion      Ankle inversion      Ankle eversion       (Blank rows = not tested)   LUMBAR SPECIAL TESTS:  Prone instability test: Negative, Straight leg raise test: Negative, and Slump test: Negative   FUNCTIONAL TESTS:  NT       GAIT: Distance walked: 150 Assistive device utilized: None Level of assistance: Complete Independence Comments: No deviations        TODAY'S TREATMENT   [facility] Adult PT Treatment:                                                DATE: 01/16/2022 Therapeutic Exercise: Supine 90/90 abdominal isometric with handhold resistance 3x30sec Knee plank 3x30 sec Prone press-up 3x30sec Seated lat pull-down with 25# cable 2x10 Seated high row with 25# cable 2x10 Seated low row with 25# cable 2x10 Seated shoulder rolls 2x10 forward and backward Manual Therapy: Prone CT junction grade V manipulation x1 BIL with cavitation Prone thoracic grade V manipulation x5 throughout thoracic spine with cavitation Sidelying lumbar grade V manipulation x1 BIL with cavitation Effleurage and tapotement to BIL thoracolumbar paraspinals/ QL Neuromuscular re-ed: N/A Therapeutic Activity: N/A Modalities: N/A Self Care: N/A      PATIENT EDUCATION:  Education details: efficacy of joint manipulation for mechanical back pain, updated HEP Person educated: Patient Education method: Explanation, Demonstration, Tactile cues, and Verbal cues Education comprehension: verbalized understanding and needs further education     HOME EXERCISE PROGRAM: Access Code: [email or web address] URL: [email or web address] Date: 01/01/2022 Prepared by: Raeford Razor   Exercises Quadruped Cat with Posterior  Pelvic Tilt - 2 x daily - 7 x weekly - 1 sets - 10 reps - 30 hold Bird Dog - 2 x daily - 7 x weekly - 2 sets - 10 reps - 5 hold  Added 01/16/2022: Abdominal Isometric Hold - FEET OFF TABLE* - 1 x daily -  7 x weekly - 3 sets - 30sec hold Plank on Knees - 1 x daily - 7 x weekly - 3 sets - 30sec hold Standing Shoulder Row with Anchored Resistance - 1 x daily - 7 x weekly - 3 sets - 10 reps - 3-sec hold     ASSESSMENT:   CLINICAL IMPRESSION: Pt responded well to all interventions today, reporting a decrease in pain from 5-6/10 to 3/10 following manual techniques, including spinal manipulation. She was then able to complete thorough core and mid back/ parascapular strengthening exercises with good form and no increase in pain. She will continue to benefit from skilled PT to address her primary impairments and return to her prior level of function with less limitation.   REHAB POTENTIAL: Excellent   CLINICAL DECISION MAKING: Evolving/moderate complexity   EVALUATION COMPLEXITY: Moderate     GOALS: Goals reviewed with patient? No       LONG TERM GOALS:    LTG Name Target Date Goal status  1 Pt will be I with HEP for spine mobility and core/limb strength  Baseline: unknown, given on eval  02/12/2022 INITIAL  2 Pt will reduce muscle spasm in back to 1-2 per week  Baseline:1-2 per day  02/12/2022 INITIAL  3 Pt will be able to report less need to reposition during work day, pain  2/10 Baseline: pain mod to severe, repositions frequently 02/12/2022 INITIAL  4 Pt will be able to improve UE and LE strength to 5/5 in hips/shoulders to maximize stability  Baseline: 02/12/2022 INITIAL  5 Pt will be able to complete gym/core exercises without increased back pain  Baseline: 02/12/2022 INITIAL                      PLAN: PT FREQUENCY: 2x/week   PT DURATION: 6 weeks   PLANNED INTERVENTIONS: Therapeutic exercises, Therapeutic activity, Neuro Muscular re-education, Balance training, Patient/Family  education, Joint mobilization, Dry Needling, Spinal mobilization, Cryotherapy, Moist heat, Taping, Manual therapy, and Pilates   PLAN FOR NEXT SESSION: check HEP (quadruped) and progress core. Manual to thoracic spine. DN paraspinals, upper trap?     Vanessa [place], PT, DPT 01/16/22 12:03 PM

## 2022-01-22 NOTE — Therapy (Signed)
OUTPATIENT PHYSICAL THERAPY TREATMENT NOTE   Patient Name: Jennifer Lynn MRN: [id number] DOB:Jul 30, 1996, 26 y.o., female Today's Date: 01/23/2022  PCP: Lyndee Hensen, DO REFERRING PROVIDER: Lyndee Hensen, DO   PT End of Session - 01/23/22 1120     Visit Number 3    Number of Visits 12    Date for PT Re-Evaluation 02/12/22    Authorization Type [employer]    Authorization Time Period Pending Auth    PT Start Time 1120   Pt arrived 5 minutes late   PT Stop Time 1200    PT Time Calculation (min) 40 min    Activity Tolerance Patient tolerated treatment well    Behavior During Therapy WFL for tasks assessed/performed              Past Medical History:  Diagnosis Date   Anemia    Blood transfusion without reported diagnosis    patient thinks she had after previous delivery   Medical history non-contributory    UTI (urinary tract infection)    Past Surgical History:  Procedure Laterality Date   CESAREAN SECTION N/A 09/16/2017   Procedure: CESAREAN SECTION;  Surgeon: Aletha Halim, MD;  Location: [place];  Service: Obstetrics;  Laterality: N/A;   CESAREAN SECTION N/A 10/31/2020   Procedure: CESAREAN SECTION;  Surgeon: Woodroe Mode, MD;  Location[facility];  Service: Obstetrics;  Laterality: N/A;   Patient Active Problem List   Diagnosis Date Noted   Chronic thoracic back pain 06/26/2021   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   Anemia 06/03/2021   GERD (gastroesophageal reflux disease) 06/03/2021   History of pre-eclampsia 10/17/2017   Alpha thalassemia trait 03/17/2017   Morbid obesity with BMI of 40.0-44.9, adult ([place]) 06/26/2015    REFERRING DIAG: postural kyphosis, thoracic pain, lumbar pain  THERAPY DIAG:  Pain in thoracic spine  Chronic bilateral low back pain, unspecified whether sciatica present  Muscle weakness (generalized)  SUBJECTIVE: Pt reports that she responded excellently to spinal manipulation at her last  session, adding that the pain has remained low since that session. She also reports that the new back exercises have been helpful, and she reports doing her HEP daily.  PAIN:  Are you having pain? Yes NPRS scale: 3/10 Pain location: Mid back PAIN TYPE: aching and tight Pain description: intermittent  Aggravating factors: Prolonged sitting Relieving factors: walking     OBJECTIVE:   *Unless otherwise noted, objective information collected previously* DIAGNOSTIC FINDINGS:  None XR done    COGNITION:          Overall cognitive status: Within functional limits for tasks assessed                        SENSATION:          Light touch: Appears intact          Stereognosis: Appears intact          Hot/Cold: Appears intact          Proprioception: Appears intact   MUSCLE LENGTH: Hamstrings: not tight Thomas test: not tight   POSTURE:  Genu recurvatum, anterior pelvic tilt/sway back, forward head, mild kyphosis     PALPATION: Pain thoracic spine with P/A pressure and hypomobile.  HYPERMOBILE in cervical spine, lumbar spine  Muscle tension in paraspinals in T-L spine Upper traps, post cervicals sore   LUMBARAROM/PROM   A/PROM A/PROM  01/01/2022  Flexion Fingertips to floor   Extension  Pain , WNL and excessive?  Right lateral flexion WNL no pain  Lt lateral flexion  WNL  Right rotation WNL  Left rotation WNL  ROM in trunk is excessive in all planes in testing    Cervical ROM  flexion WNL  extension WNL  Rt lateral flexion 45  Lt lateral flexion 55  Rt rot WNL  Lt rot  WNL      UE strength shoulder flexion, abduction bilateral 4/5.  01/23/2022: BIL shoulder flexion 4+/5, BIL shoulder abduction 5/5     LE AROM/PROM:   A/PROM Right 01/01/2022 Left 01/01/2022  Hip flexion      Hip extension      Hip abduction      Hip adduction      Hip internal rotation      Hip external rotation      Knee flexion      Knee extension      Ankle dorsiflexion      Ankle  plantarflexion      Ankle inversion      Ankle eversion       (Blank rows = not tested)   LE MMT:   MMT Right 01/01/2022 Left 01/01/2022 Right 01/23/2022 Left 01/23/2022  Hip flexion 4/5 4/5 5/5 5/5  Hip extension 4/5 4/5 4/5 4/5  Hip abduction 4/5 4/5 +/5 5/54  Hip adduction        Hip internal rotation        Hip external rotation        Knee flexion 5/5 5/5    Knee extension 5/5 5/5    Ankle dorsiflexion        Ankle plantarflexion        Ankle inversion        Ankle eversion         (Blank rows = not tested)   LUMBAR SPECIAL TESTS:  Prone instability test: Negative, Straight leg raise test: Negative, and Slump test: Negative   FUNCTIONAL TESTS:  NT       GAIT: Distance walked: 150 Assistive device utilized: None Level of assistance: Complete Independence Comments: No deviations        TODAY'S TREATMENT   [facility] Adult PT Treatment:                                                DATE: 01/22/2022 Therapeutic Exercise: Standing Pallof press with GTB 2x10 with 5-sec hold BIL Standing Cybex hip abduction with 25# cable 2x10 BIL Standing Cybex hip extension with 25# cable 2x10 BIL Seated lat pull-down with 35# cable 2x10 Seated high row with 35# cable 2x10 Seated low row with 35# cable 2x10 Seated shoulder rolls 2x10 forward and backward Manual Therapy: N/A Neuromuscular re-ed: N/A Therapeutic Activity: N/A Modalities: N/A Self Care: N/A   [facility] Adult PT Treatment:                                                DATE: 01/16/2022 Therapeutic Exercise: Supine 90/90 abdominal isometric with handhold resistance 3x30sec Knee plank 3x30 sec Prone press-up 3x30sec Seated lat pull-down with 25# cable 2x10 Seated high row with 25# cable 2x10 Seated low row with 25# cable 2x10 Seated shoulder rolls 2x10 forward and  backward Manual Therapy: Prone CT junction grade V manipulation x1 BIL with cavitation Prone thoracic grade V manipulation x5 throughout thoracic spine  with cavitation Sidelying lumbar grade V manipulation x1 BIL with cavitation Effleurage and tapotement to BIL thoracolumbar paraspinals/ QL Neuromuscular re-ed: N/A Therapeutic Activity: N/A Modalities: N/A Self Care: N/A      PATIENT EDUCATION:  Education details: Updated HEP Person educated: Patient Education method: Explanation, Demonstration, Tactile cues, and Verbal cues Education comprehension: verbalized understanding and needs further education     HOME EXERCISE PROGRAM: Access Code: [email or web address] URL: [email or web address] Date: 01/01/2022 Prepared by: Raeford Razor   Exercises Quadruped Cat with Posterior Pelvic Tilt - 2 x daily - 7 x weekly - 1 sets - 10 reps - 30 hold Bird Dog - 2 x daily - 7 x weekly - 2 sets - 10 reps - 5 hold  Added 01/16/2022: Abdominal Isometric Hold - FEET OFF TABLE* - 1 x daily - 7 x weekly - 3 sets - 30sec hold Plank on Knees - 1 x daily - 7 x weekly - 3 sets - 30sec hold Standing Shoulder Row with Anchored Resistance - 1 x daily - 7 x weekly - 3 sets - 10 reps - 3-sec hold  Added 01/23/2022: Sidelying Hip Abduction - 1 x daily - 7 x weekly - 3 sets - 10 reps - 3-sec hold Standing Anti-Rotation Press with Anchored Resistance - 1 x daily - 7 x weekly - 2 sets - 10 reps - 5-sec hold     ASSESSMENT:   CLINICAL IMPRESSION: Pt responded well to all interventions today, demonstrating good form and no increase in pain with selected exercises. Upon re-assessment, the pt has made progress in her UE and LE strength measures, as well as baseline pain levels since last visit. Due to positive response to manual techniques and spinal manipulation at last session, these techniques may continue to be utilized in future sessions for pain modulation. She will continue to benefit from skilled PT to address her primary impairments and return to her prior level of function with less limitation.   REHAB POTENTIAL: Excellent   CLINICAL DECISION  MAKING: Evolving/moderate complexity   EVALUATION COMPLEXITY: Moderate     GOALS: Goals reviewed with patient? No       LONG TERM GOALS:    LTG Name Target Date Goal status  1 Pt will be I with HEP for spine mobility and core/limb strength  Baseline: unknown, given on eval  01/23/2022: Pt reports daily HEP adherence 02/12/2022 MET  2 Pt will reduce muscle spasm in back to 1-2 per week  Baseline:1-2 per day  01/23/2022: Pt denies any muscle spasms over the past week 02/12/2022 MET  3 Pt will be able to report less need to reposition during work day, pain  2/10 Baseline: pain mod to severe, repositions frequently 01/23/2022: Pt reports repositioning every hour at work 02/12/2022 PROGRESSING  4 Pt will be able to improve UE and LE strength to 5/5 in hips/shoulders to maximize stability  Baseline: 01/23/2022: See updated MMT chart 02/12/2022 PROGRESSING  5 Pt will be able to complete gym/core exercises without increased back pain  Baseline: 02/12/2022 INITIAL                      PLAN: PT FREQUENCY: 2x/week   PT DURATION: 6 weeks   PLANNED INTERVENTIONS: Therapeutic exercises, Therapeutic activity, Neuro Muscular re-education, Balance training, Patient/Family education, Joint mobilization, Dry Needling, Spinal mobilization, Cryotherapy, Moist heat, Taping,  Manual therapy, and Pilates   PLAN FOR NEXT SESSION: Progress core/ parascapular/ hip strengthening; manual techniques/ spinal manipulation PRN    Vanessa Keene, PT, DPT 01/23/22 11:58 AM

## 2022-01-23 ENCOUNTER — Ambulatory Visit: Payer: Medicaid Other

## 2022-01-23 ENCOUNTER — Other Ambulatory Visit: Payer: Self-pay

## 2022-01-23 DIAGNOSIS — M546 Pain in thoracic spine: Secondary | ICD-10-CM

## 2022-01-23 DIAGNOSIS — G8929 Other chronic pain: Secondary | ICD-10-CM | POA: Diagnosis not present

## 2022-01-23 DIAGNOSIS — M545 Low back pain, unspecified: Secondary | ICD-10-CM

## 2022-01-23 DIAGNOSIS — M6281 Muscle weakness (generalized): Secondary | ICD-10-CM

## 2022-01-29 NOTE — Therapy (Signed)
OUTPATIENT PHYSICAL THERAPY TREATMENT NOTE   Patient Name: Jennifer Lynn MRN: [id number] DOB:[birth year], 26 y.o., female Today's Date: 01/30/2022  PCP: Lyndee Hensen, DO REFERRING PROVIDER: Lyndee Hensen, DO   PT End of Session - 01/30/22 1118     Visit Number 4    Number of Visits 12    Date for PT Re-Evaluation 02/12/22    Authorization Type [employer]    Authorization Time Period Pending Auth    PT Start Time 1120    PT Stop Time 1200    PT Time Calculation (min) 40 min    Activity Tolerance Patient tolerated treatment well    Behavior During Therapy WFL for tasks assessed/performed               Past Medical History:  Diagnosis Date   Anemia    Blood transfusion without reported diagnosis    patient thinks she had after previous delivery   Medical history non-contributory    UTI (urinary tract infection)    Past Surgical History:  Procedure Laterality Date   CESAREAN SECTION N/A 09/16/2017   Procedure: CESAREAN SECTION;  Surgeon: Aletha Halim, MD;  Location: [place];  Service: Obstetrics;  Laterality: N/A;   CESAREAN SECTION N/A 10/31/2020   Procedure: CESAREAN SECTION;  Surgeon: Woodroe Mode, MD;  Location: [facility];  Service: Obstetrics;  Laterality: N/A;   Patient Active Problem List   Diagnosis Date Noted   Chronic thoracic back pain 06/26/2021   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   Anemia 06/03/2021   GERD (gastroesophageal reflux disease) 06/03/2021   History of pre-eclampsia 10/17/2017   Alpha thalassemia trait 03/17/2017   Morbid obesity with BMI of 40.0-44.9, adult ([place]) 06/26/2015    REFERRING DIAG: postural kyphosis, thoracic pain, lumbar pain  THERAPY DIAG:  Pain in thoracic spine  Chronic bilateral low back pain, unspecified whether sciatica present  Muscle weakness (generalized)  SUBJECTIVE: Pt reports increased fatigue today due to having a long night. She reports 4/10 pain in her upper  back.  PAIN:  Are you having pain? Yes NPRS scale: 4/10 Pain location: Mid back PAIN TYPE: aching and tight Pain description: intermittent  Aggravating factors: Prolonged sitting Relieving factors: walking     OBJECTIVE:   *Unless otherwise noted, objective information collected previously* DIAGNOSTIC FINDINGS:  None XR done    COGNITION:          Overall cognitive status: Within functional limits for tasks assessed                        SENSATION:          Light touch: Appears intact          Stereognosis: Appears intact          Hot/Cold: Appears intact          Proprioception: Appears intact   MUSCLE LENGTH: Hamstrings: not tight Thomas test: not tight   POSTURE:  Genu recurvatum, anterior pelvic tilt/sway back, forward head, mild kyphosis     PALPATION: Pain thoracic spine with P/A pressure and hypomobile.  HYPERMOBILE in cervical spine, lumbar spine  Muscle tension in paraspinals in T-L spine Upper traps, post cervicals sore   LUMBARAROM/PROM   A/PROM A/PROM  01/01/2022  Flexion Fingertips to floor   Extension Pain , WNL and excessive?  Right lateral flexion WNL no pain  Lt lateral flexion  WNL  Right rotation WNL  Left rotation WNL  ROM in trunk is excessive in all planes in testing    Cervical ROM  flexion WNL  extension WNL  Rt lateral flexion 45  Lt lateral flexion 55  Rt rot WNL  Lt rot  WNL      UE strength shoulder flexion, abduction bilateral 4/5.  01/23/2022: BIL shoulder flexion 4+/5, BIL shoulder abduction 5/5     LE AROM/PROM:   A/PROM Right 01/01/2022 Left 01/01/2022  Hip flexion      Hip extension      Hip abduction      Hip adduction      Hip internal rotation      Hip external rotation      Knee flexion      Knee extension      Ankle dorsiflexion      Ankle plantarflexion      Ankle inversion      Ankle eversion       (Blank rows = not tested)   LE MMT:   MMT Right 01/01/2022 Left 01/01/2022 Right 01/23/2022  Left 01/23/2022  Hip flexion 4/5 4/5 5/5 5/5  Hip extension 4/5 4/5 4/5 4/5  Hip abduction 4/5 4/5 4+/5 5/5  Hip adduction        Hip internal rotation        Hip external rotation        Knee flexion 5/5 5/5    Knee extension 5/5 5/5    Ankle dorsiflexion        Ankle plantarflexion        Ankle inversion        Ankle eversion         (Blank rows = not tested)   LUMBAR SPECIAL TESTS:  Prone instability test: Negative, Straight leg raise test: Negative, and Slump test: Negative   FUNCTIONAL TESTS:  NT       GAIT: Distance walked: 150 Assistive device utilized: None Level of assistance: Complete Independence Comments: No deviations        TODAY'S TREATMENT   [facility] Adult PT Treatment:                                                DATE: 01/30/2022 Therapeutic Exercise: Prone swimmers 2x10 BIL Prone with hips on BOSU ball, trunk extension with PT holding down legs 3x10 Seated lat pull-down with 35# cable 2x10 Seated high row with 35# cable 2x10 Seated low row with 35# cable 2x10 Seated shoulder rolls 2x10 forward and backward Manual Therapy: Prone CT junction grade V manipulation x1 BIL with cavitation Prone thoracic grade V manipulation x5 throughout thoracic spine with cavitation Effleurage and tapotement to BIL mid/ upper traps Supine manual cervical distraction x3 minutes Supine suboccipital release/ cervical paraspinal effleurage Neuromuscular re-ed: N/A Therapeutic Activity: N/A Modalities: N/A Self Care: N/A   [facility] Adult PT Treatment:                                                DATE: 01/22/2022 Therapeutic Exercise: Standing Pallof press with GTB 2x10 with 5-sec hold BIL Standing Cybex hip abduction with 25# cable 2x10 BIL Standing Cybex hip extension with 25# cable 2x10 BIL Seated lat pull-down with 35# cable 2x10 Seated high row with  35# cable 2x10 Seated low row with 35# cable 2x10 Seated shoulder rolls 2x10 forward and backward Manual  Therapy: N/A Neuromuscular re-ed: N/A Therapeutic Activity: N/A Modalities: N/A Self Care: N/A   [facility] Adult PT Treatment:                                                DATE: 01/16/2022 Therapeutic Exercise: Supine 90/90 abdominal isometric with handhold resistance 3x30sec Knee plank 3x30 sec Prone press-up 3x30sec Seated lat pull-down with 25# cable 2x10 Seated high row with 25# cable 2x10 Seated low row with 25# cable 2x10 Seated shoulder rolls 2x10 forward and backward Manual Therapy: Prone CT junction grade V manipulation x1 BIL with cavitation Prone thoracic grade V manipulation x5 throughout thoracic spine with cavitation Sidelying lumbar grade V manipulation x1 BIL with cavitation Effleurage and tapotement to BIL thoracolumbar paraspinals/ QL Neuromuscular re-ed: N/A Therapeutic Activity: N/A Modalities: N/A Self Care: N/A      PATIENT EDUCATION:  Education details: Updated HEP Person educated: Patient Education method: Explanation, Demonstration, Tactile cues, and Verbal cues Education comprehension: verbalized understanding and needs further education     HOME EXERCISE PROGRAM: Access Code: [email or web address] URL: [email or web address] Date: 01/01/2022 Prepared by: Raeford Razor   Exercises Quadruped Cat with Posterior Pelvic Tilt - 2 x daily - 7 x weekly - 1 sets - 10 reps - 30 hold Bird Dog - 2 x daily - 7 x weekly - 2 sets - 10 reps - 5 hold  Added 01/16/2022: Abdominal Isometric Hold - FEET OFF TABLE* - 1 x daily - 7 x weekly - 3 sets - 30sec hold Plank on Knees - 1 x daily - 7 x weekly - 3 sets - 30sec hold Standing Shoulder Row with Anchored Resistance - 1 x daily - 7 x weekly - 3 sets - 10 reps - 3-sec hold  Added 01/23/2022: Sidelying Hip Abduction - 1 x daily - 7 x weekly - 3 sets - 10 reps - 3-sec hold Standing Anti-Rotation Press with Anchored Resistance - 1 x daily - 7 x weekly - 2 sets - 10 reps - 5-sec hold     ASSESSMENT:    CLINICAL IMPRESSION: Pt responded well to all interventions today, demonstrating good form and no increase in pain with selected exercises. The pt reports the onset of a headache and neck pain following prone trunk extensions. However, following manual techniques including suboccipital release and cervical distraction, the pt reports the elimination of the headache and 0/10 pain. She leaves clinic with 0/10 pain today and reports that all manual techniques have been very helpful with her pain. She will continue to benefit from skilled PT to address her primary impairments and return to her prior level of function with less limitation.   REHAB POTENTIAL: Excellent   CLINICAL DECISION MAKING: Evolving/moderate complexity   EVALUATION COMPLEXITY: Moderate     GOALS: Goals reviewed with patient? No       LONG TERM GOALS:    LTG Name Target Date Goal status  1 Pt will be I with HEP for spine mobility and core/limb strength  Baseline: unknown, given on eval  01/23/2022: Pt reports daily HEP adherence 02/12/2022 MET  2 Pt will reduce muscle spasm in back to 1-2 per week  Baseline:1-2 per day  01/23/2022: Pt denies any muscle spasms over the past week 02/12/2022 MET  3 Pt will be able to report less need to reposition during work day, pain  2/10 Baseline: pain mod to severe, repositions frequently 01/23/2022: Pt reports repositioning every hour at work 02/12/2022 PROGRESSING  4 Pt will be able to improve UE and LE strength to 5/5 in hips/shoulders to maximize stability  Baseline: 01/23/2022: See updated MMT chart 02/12/2022 PROGRESSING  5 Pt will be able to complete gym/core exercises without increased back pain  Baseline: 02/12/2022 INITIAL                      PLAN: PT FREQUENCY: 2x/week   PT DURATION: 6 weeks   PLANNED INTERVENTIONS: Therapeutic exercises, Therapeutic activity, Neuro Muscular re-education, Balance training, Patient/Family education, Joint mobilization, Dry Needling, Spinal  mobilization, Cryotherapy, Moist heat, Taping, Manual therapy, and Pilates   PLAN FOR NEXT SESSION: Progress core/ parascapular/ hip strengthening; manual techniques/ spinal manipulation PRN    Vanessa Dunbar, PT, DPT 01/30/22 11:59 AM

## 2022-01-30 ENCOUNTER — Other Ambulatory Visit: Payer: Self-pay

## 2022-01-30 ENCOUNTER — Ambulatory Visit: Payer: Medicaid Other

## 2022-01-30 DIAGNOSIS — M546 Pain in thoracic spine: Secondary | ICD-10-CM | POA: Diagnosis not present

## 2022-01-30 DIAGNOSIS — G8929 Other chronic pain: Secondary | ICD-10-CM

## 2022-01-30 DIAGNOSIS — M6281 Muscle weakness (generalized): Secondary | ICD-10-CM | POA: Diagnosis not present

## 2022-01-30 DIAGNOSIS — M545 Low back pain, unspecified: Secondary | ICD-10-CM | POA: Diagnosis not present

## 2022-02-01 ENCOUNTER — Telehealth: Payer: Self-pay | Admitting: Family Medicine

## 2022-02-01 NOTE — Telephone Encounter (Signed)
Patient dropped off FMLA paperwork to be completed. Last DOS was 06/26/21. Placed in [employer].

## 2022-02-03 NOTE — Telephone Encounter (Signed)
Called to schedule appointment to discuss fmla. I called the number in chart and it said it could not be completed.   Thanks [employer]

## 2022-02-05 NOTE — Therapy (Incomplete)
OUTPATIENT PHYSICAL THERAPY TREATMENT NOTE   Patient Name: Jennifer Lynn MRN: [id number] DOB:05-13-1996, 26 y.o., female Today's Date: 02/05/2022  PCP: Lyndee Hensen, DO REFERRING PROVIDER: Lyndee Hensen, DO       Past Medical History:  Diagnosis Date   Anemia    Blood transfusion without reported diagnosis    patient thinks she had after previous delivery   Medical history non-contributory    UTI (urinary tract infection)    Past Surgical History:  Procedure Laterality Date   CESAREAN SECTION N/A 09/16/2017   Procedure: CESAREAN SECTION;  Surgeon: Aletha Halim, MD;  Location: [place];  Service: Obstetrics;  Laterality: N/A;   CESAREAN SECTION N/A 10/31/2020   Procedure: CESAREAN SECTION;  Surgeon: Woodroe Mode, MD;  Location: [facility];  Service: Obstetrics;  Laterality: N/A;   Patient Active Problem List   Diagnosis Date Noted   Chronic thoracic back pain 06/26/2021   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   Anemia 06/03/2021   GERD (gastroesophageal reflux disease) 06/03/2021   History of pre-eclampsia 10/17/2017   Alpha thalassemia trait 03/17/2017   Morbid obesity with BMI of 40.0-44.9, adult ([place]) 06/26/2015    REFERRING DIAG: postural kyphosis, thoracic pain, lumbar pain  THERAPY DIAG:  No diagnosis found.  SUBJECTIVE: ***  PAIN:  Are you having pain? Yes NPRS scale: 4/10 Pain location: Mid back PAIN TYPE: aching and tight Pain description: intermittent  Aggravating factors: Prolonged sitting Relieving factors: walking     OBJECTIVE:   *Unless otherwise noted, objective information collected previously* DIAGNOSTIC FINDINGS:  None XR done    COGNITION:          Overall cognitive status: Within functional limits for tasks assessed                        SENSATION:          Light touch: Appears intact          Stereognosis: Appears intact          Hot/Cold: Appears intact          Proprioception: Appears  intact   MUSCLE LENGTH: Hamstrings: not tight Thomas test: not tight   POSTURE:  Genu recurvatum, anterior pelvic tilt/sway back, forward head, mild kyphosis     PALPATION: Pain thoracic spine with P/A pressure and hypomobile.  HYPERMOBILE in cervical spine, lumbar spine  Muscle tension in paraspinals in T-L spine Upper traps, post cervicals sore   LUMBARAROM/PROM   A/PROM A/PROM  01/01/2022  Flexion Fingertips to floor   Extension Pain , WNL and excessive?  Right lateral flexion WNL no pain  Lt lateral flexion  WNL  Right rotation WNL  Left rotation WNL  ROM in trunk is excessive in all planes in testing    Cervical ROM  flexion WNL  extension WNL  Rt lateral flexion 45  Lt lateral flexion 55  Rt rot WNL  Lt rot  WNL      UE strength shoulder flexion, abduction bilateral 4/5.  01/23/2022: BIL shoulder flexion 4+/5, BIL shoulder abduction 5/5     LE AROM/PROM:   A/PROM Right 01/01/2022 Left 01/01/2022  Hip flexion      Hip extension      Hip abduction      Hip adduction      Hip internal rotation      Hip external rotation      Knee flexion      Knee extension  Ankle dorsiflexion      Ankle plantarflexion      Ankle inversion      Ankle eversion       (Blank rows = not tested)   LE MMT:   MMT Right 01/01/2022 Left 01/01/2022 Right 01/23/2022 Left 01/23/2022  Hip flexion 4/5 4/5 5/5 5/5  Hip extension 4/5 4/5 4/5 4/5  Hip abduction 4/5 4/5 4+/5 5/5  Hip adduction        Hip internal rotation        Hip external rotation        Knee flexion 5/5 5/5    Knee extension 5/5 5/5    Ankle dorsiflexion        Ankle plantarflexion        Ankle inversion        Ankle eversion         (Blank rows = not tested)   LUMBAR SPECIAL TESTS:  Prone instability test: Negative, Straight leg raise test: Negative, and Slump test: Negative   FUNCTIONAL TESTS:  NT       GAIT: Distance walked: 150 Assistive device utilized: None Level of assistance:  Complete Independence Comments: No deviations        TODAY'S TREATMENT   [facility] Adult PT Treatment:                                                DATE: 02/06/2022 Therapeutic Exercise: *** Manual Therapy: *** Neuromuscular re-ed: *** Therapeutic Activity: *** Modalities: *** Self Care: ***   [facility] Adult PT Treatment:                                                DATE: 01/30/2022 Therapeutic Exercise: Prone swimmers 2x10 BIL Prone with hips on BOSU ball, trunk extension with PT holding down legs 3x10 Seated lat pull-down with 35# cable 2x10 Seated high row with 35# cable 2x10 Seated low row with 35# cable 2x10 Seated shoulder rolls 2x10 forward and backward Manual Therapy: Prone CT junction grade V manipulation x1 BIL with cavitation Prone thoracic grade V manipulation x5 throughout thoracic spine with cavitation Effleurage and tapotement to BIL mid/ upper traps Supine manual cervical distraction x3 minutes Supine suboccipital release/ cervical paraspinal effleurage Neuromuscular re-ed: N/A Therapeutic Activity: N/A Modalities: N/A Self Care: N/A   [facility] Adult PT Treatment:                                                DATE: 01/22/2022 Therapeutic Exercise: Standing Pallof press with GTB 2x10 with 5-sec hold BIL Standing Cybex hip abduction with 25# cable 2x10 BIL Standing Cybex hip extension with 25# cable 2x10 BIL Seated lat pull-down with 35# cable 2x10 Seated high row with 35# cable 2x10 Seated low row with 35# cable 2x10 Seated shoulder rolls 2x10 forward and backward Manual Therapy: N/A Neuromuscular re-ed: N/A Therapeutic Activity: N/A Modalities: N/A Self Care: N/A       PATIENT EDUCATION:  Education details: Updated HEP Person educated: Patient Education method: Explanation, Demonstration, Tactile cues, and Verbal cues Education comprehension: verbalized understanding and needs  further education     HOME EXERCISE PROGRAM: Access Code:  [email or web address] URL: [email or web address] Date: 01/01/2022 Prepared by: Raeford Razor   Exercises Quadruped Cat with Posterior Pelvic Tilt - 2 x daily - 7 x weekly - 1 sets - 10 reps - 30 hold Bird Dog - 2 x daily - 7 x weekly - 2 sets - 10 reps - 5 hold  Added 01/16/2022: Abdominal Isometric Hold - FEET OFF TABLE* - 1 x daily - 7 x weekly - 3 sets - 30sec hold Plank on Knees - 1 x daily - 7 x weekly - 3 sets - 30sec hold Standing Shoulder Row with Anchored Resistance - 1 x daily - 7 x weekly - 3 sets - 10 reps - 3-sec hold  Added 01/23/2022: Sidelying Hip Abduction - 1 x daily - 7 x weekly - 3 sets - 10 reps - 3-sec hold Standing Anti-Rotation Press with Anchored Resistance - 1 x daily - 7 x weekly - 2 sets - 10 reps - 5-sec hold     ASSESSMENT:   CLINICAL IMPRESSION: ***   REHAB POTENTIAL: Excellent   CLINICAL DECISION MAKING: Evolving/moderate complexity   EVALUATION COMPLEXITY: Moderate     GOALS: Goals reviewed with patient? No       LONG TERM GOALS:    LTG Name Target Date Goal status  1 Pt will be I with HEP for spine mobility and core/limb strength  Baseline: unknown, given on eval  01/23/2022: Pt reports daily HEP adherence 02/12/2022 MET  2 Pt will reduce muscle spasm in back to 1-2 per week  Baseline:1-2 per day  01/23/2022: Pt denies any muscle spasms over the past week 02/12/2022 MET  3 Pt will be able to report less need to reposition during work day, pain  2/10 Baseline: pain mod to severe, repositions frequently 01/23/2022: Pt reports repositioning every hour at work 02/12/2022 PROGRESSING  4 Pt will be able to improve UE and LE strength to 5/5 in hips/shoulders to maximize stability  Baseline: 01/23/2022: See updated MMT chart 02/12/2022 PROGRESSING  5 Pt will be able to complete gym/core exercises without increased back pain  Baseline: 02/12/2022 INITIAL                      PLAN: PT FREQUENCY: 2x/week   PT DURATION: 6 weeks   PLANNED  INTERVENTIONS: Therapeutic exercises, Therapeutic activity, Neuro Muscular re-education, Balance training, Patient/Family education, Joint mobilization, Dry Needling, Spinal mobilization, Cryotherapy, Moist heat, Taping, Manual therapy, and Pilates   PLAN FOR NEXT SESSION: Progress core/ parascapular/ hip strengthening; manual techniques/ spinal manipulation PRN    Vanessa [place], PT, DPT 02/05/22 10:49 AM

## 2022-02-06 ENCOUNTER — Ambulatory Visit: Payer: Medicaid Other

## 2022-02-09 NOTE — Telephone Encounter (Signed)
Attempted to reach patient. No answer. Tried calling patients and patient's mother phone number. Neither one are working. Aquilla Solian, CMA

## 2022-02-12 NOTE — Telephone Encounter (Signed)
Patient has appt on 3/8 with Dr. Rachael Darby. Jennifer Lynn, CMA ? ?

## 2022-02-17 ENCOUNTER — Other Ambulatory Visit: Payer: Self-pay

## 2022-02-17 ENCOUNTER — Ambulatory Visit (INDEPENDENT_AMBULATORY_CARE_PROVIDER_SITE_OTHER): Payer: Medicaid Other | Admitting: Family Medicine

## 2022-02-17 ENCOUNTER — Encounter: Payer: Self-pay | Admitting: Family Medicine

## 2022-02-17 VITALS — BP 143/91 | HR 63 | Ht 64.0 in | Wt 221.2 lb

## 2022-02-17 DIAGNOSIS — M546 Pain in thoracic spine: Secondary | ICD-10-CM

## 2022-02-17 DIAGNOSIS — R300 Dysuria: Secondary | ICD-10-CM

## 2022-02-17 DIAGNOSIS — G8929 Other chronic pain: Secondary | ICD-10-CM | POA: Diagnosis not present

## 2022-02-17 LAB — POCT URINALYSIS DIP (MANUAL ENTRY)
Bilirubin, UA: NEGATIVE
Blood, UA: NEGATIVE
Glucose, UA: NEGATIVE mg/dL
Ketones, POC UA: NEGATIVE mg/dL
Leukocytes, UA: NEGATIVE
Nitrite, UA: NEGATIVE
Protein Ur, POC: NEGATIVE mg/dL
Spec Grav, UA: >=1.03 — AB (ref 1.010–1.025)
Urobilinogen, UA: 0.2 E.U./dL
pH, UA: 5.5 (ref 5.0–8.0)

## 2022-02-17 MED ORDER — LIDOCAINE 4 % EX PTCH: 1.0000 | MEDICATED_PATCH | Freq: Every day | CUTANEOUS | 0 refills | Status: DC

## 2022-02-17 NOTE — Progress Notes (Signed)
? ?  SUBJECTIVE:  ? ?CHIEF COMPLAINT / HPI:  ? ? ?Jennifer Lynn is a 26 y.o. female here for to discuss FMLA.  ? ?Pt reports ongoing mid back pain since November 2021. She was surprised to go see a Engineer, petroleum about a breast reduction. States she has good fitting bras. She has been missing work a few times a month. States her pain hurts really bad. Plastic surgery referred her to physical therapy.  She is supposed to be going twice a week. Reports pain has been improving with certain therapies. She has not been taking anything for pain as her family members have history with medications. Requests FMLA.  ? ?Has had some slight dysuria and urinary frequency. Patient's last menstrual period was 02/01/2022. No vaginal discharge or odor.  ? ? ?PERTINENT  PMH / PSH: reviewed and updated as appropriate  ? ?OBJECTIVE:  ? ?BP (!) 143/91   Pulse 63   Ht 5\' 4"  (1.626 m)   Wt 221 lb 3.2 oz (100.3 kg)   LMP 02/01/2022   SpO2 100%   BMI 37.97 kg/m?   ? ?GEN: well appearing female, in no acute distress  ?CV: regular rate  ?RESP: no increased work of breathing ?ABD: no CVA tenderness  ?MSK: Thoracic and Lumbar spine: ?- Inspection: no gross deformity or asymmetry, swelling or ecchymosis. No overlying skin changes ?- Palpation: No TTP over the spinous processes, has mild tenderness to mid thoracic paraspinal muscles ?- ROM: full active ROM of the thoracic spine in rotation, flexion and extension without pain ?- Strength: 5/5 strength of upper and lower extremity  ?- Neuro: gross sensation intact ?- Normal gait as patient walked out of exam room  ?SKIN: warm, dry ? ? ?ASSESSMENT/PLAN:  ? ?Chronic thoracic back pain ?Acute on chronic. She has not had a work-up for her back pain.  Previously suspected thoracic back pain is related to her large breast size as she reported large breasts and recent weight gain. Pain was reduced when breasts were lifted.  She called to request referral. Seen by plastic surgery for possible  breast reduction but she did not want reduction. States she has good fitting bras. Currently, does not take medications for her back pain. She has been missing work. Needs a through evaluation of her back pain. Obtain thoracic xray today.  ? ?Requests FMLA for back pain. Will provide FMLA for her to go to physical therapy twice a week until she is dismissed from PT. Not for PRN use. Will need to define goals of care as she declines medication at this time.  ? ?Follow up in 6 weeks.  ?  ?Dysuria  ?UA unremarkable for cystis. No vaginal dc or odor to suggest need for wet prep.  Follow up if dysuria worsening. May need STD testing  ? ? ? ?02/03/2022, DO ?PGY-3, [facility] ?02/21/2022  ? ? ? ? ? ? ? ? ?

## 2022-02-21 ENCOUNTER — Encounter: Payer: Self-pay | Admitting: Family Medicine

## 2022-02-21 NOTE — Patient Instructions (Signed)
It was great seeing you today! ? ?Please check-out at the front desk before leaving the clinic. I'd like to see you back in 6 weeks but if you need to be seen earlier than that for any new issues we're happy to fit you in, just give [place] a call! ? ?Visit Remembers: ?- Consider taking tylenol or ibuprofen for pain. These are not habit forming  ?- Continue to work on your healthy eating habits and incorporating exercise into your daily life.  ?- Stop by [facility] to get your back xray  ?- After physical therapy if back pain continues, you may need to see a orthopedic specialist  ?- Will provide FMLA for your to attend physical therapy. We will need to thoroughly evaluate your back pain  ? ?Go to either of the addresses below to get your xray: ? ?A ?[facility] ?[address] ? In [facility] ? [phone] ?Open ? Closes 5:30PM ? ?OR ? ?B ?[facility] ?[address] ? [phone] ?Open ? Closes 5PM ? ? ?Your urine study did not show infection. If you continue to have urinary frequency or burning please return for additional evaluation.  ? ?Feel free to call with any questions or concerns at any time, at [phone]. ?  ?Take care,  ?Dr. Rachael Darby ?[facility]  ?

## 2022-02-21 NOTE — Assessment & Plan Note (Addendum)
Acute on chronic. She has not had a work-up for her back pain.  Previously suspected thoracic back pain is related to her large breast size as she reported large breasts and recent weight gain. Pain was reduced when breasts were lifted.  She called to request referral. Seen by plastic surgery for possible breast reduction but she did not want reduction. States she has good fitting bras. Currently, does not take medications for her back pain. She has been missing work. Needs a through evaluation of her back pain. Obtain thoracic xray today.  ? ?Requests FMLA for back pain. Will provide FMLA for her to go to physical therapy twice a week until she is discharged from PT. Not for PRN use. Will need to define goals of care as she declines medication at this time.  ? ?Follow up in 6 weeks.  ?

## 2022-02-22 NOTE — Telephone Encounter (Signed)
FMLA paperwork faxed to provided number.  ? ?Copy made and placed in batch scanning.  ? ?Veronda Prude, RN ? ?

## 2022-04-12 DIAGNOSIS — R300 Dysuria: Secondary | ICD-10-CM | POA: Diagnosis not present

## 2022-04-12 DIAGNOSIS — Z113 Encounter for screening for infections with a predominantly sexual mode of transmission: Secondary | ICD-10-CM | POA: Diagnosis not present

## 2022-04-12 DIAGNOSIS — R1030 Lower abdominal pain, unspecified: Secondary | ICD-10-CM | POA: Diagnosis not present

## 2022-05-18 ENCOUNTER — Encounter: Payer: Self-pay | Admitting: *Deleted

## 2022-07-21 DIAGNOSIS — R1030 Lower abdominal pain, unspecified: Secondary | ICD-10-CM | POA: Diagnosis not present

## 2022-07-21 DIAGNOSIS — Z113 Encounter for screening for infections with a predominantly sexual mode of transmission: Secondary | ICD-10-CM | POA: Diagnosis not present

## 2022-07-21 DIAGNOSIS — Z3202 Encounter for pregnancy test, result negative: Secondary | ICD-10-CM | POA: Diagnosis not present

## 2022-07-21 DIAGNOSIS — R300 Dysuria: Secondary | ICD-10-CM | POA: Diagnosis not present

## 2022-10-24 DIAGNOSIS — N390 Urinary tract infection, site not specified: Secondary | ICD-10-CM | POA: Diagnosis not present

## 2022-11-12 DIAGNOSIS — D649 Anemia, unspecified: Secondary | ICD-10-CM | POA: Diagnosis not present

## 2022-11-12 DIAGNOSIS — Z13220 Encounter for screening for lipoid disorders: Secondary | ICD-10-CM | POA: Diagnosis not present

## 2022-11-12 DIAGNOSIS — Z131 Encounter for screening for diabetes mellitus: Secondary | ICD-10-CM | POA: Diagnosis not present

## 2022-11-12 DIAGNOSIS — Z1329 Encounter for screening for other suspected endocrine disorder: Secondary | ICD-10-CM | POA: Diagnosis not present

## 2022-11-12 DIAGNOSIS — E559 Vitamin D deficiency, unspecified: Secondary | ICD-10-CM | POA: Diagnosis not present

## 2022-11-12 DIAGNOSIS — R635 Abnormal weight gain: Secondary | ICD-10-CM | POA: Diagnosis not present

## 2022-12-01 ENCOUNTER — Ambulatory Visit (HOSPITAL_COMMUNITY)
Admission: EM | Admit: 2022-12-01 | Discharge: 2022-12-01 | Disposition: A | Discharge status: Home / Self Care | Payer: Medicaid Other | Attending: Physician Assistant | Admitting: Physician Assistant

## 2022-12-01 ENCOUNTER — Other Ambulatory Visit: Payer: Self-pay

## 2022-12-01 ENCOUNTER — Encounter (HOSPITAL_COMMUNITY): Payer: Self-pay | Admitting: *Deleted

## 2022-12-01 DIAGNOSIS — K529 Noninfective gastroenteritis and colitis, unspecified: Secondary | ICD-10-CM | POA: Insufficient documentation

## 2022-12-01 DIAGNOSIS — Z1152 Encounter for screening for COVID-19: Secondary | ICD-10-CM | POA: Insufficient documentation

## 2022-12-01 DIAGNOSIS — Z113 Encounter for screening for infections with a predominantly sexual mode of transmission: Secondary | ICD-10-CM | POA: Diagnosis not present

## 2022-12-01 DIAGNOSIS — R6883 Chills (without fever): Secondary | ICD-10-CM | POA: Diagnosis not present

## 2022-12-01 DIAGNOSIS — R5383 Other fatigue: Secondary | ICD-10-CM | POA: Diagnosis not present

## 2022-12-01 DIAGNOSIS — Z3202 Encounter for pregnancy test, result negative: Secondary | ICD-10-CM | POA: Diagnosis not present

## 2022-12-01 DIAGNOSIS — R5381 Other malaise: Secondary | ICD-10-CM | POA: Diagnosis not present

## 2022-12-01 LAB — HIV ANTIBODY (ROUTINE TESTING W REFLEX): HIV Screen 4th Generation wRfx: NONREACTIVE

## 2022-12-01 LAB — COMPREHENSIVE METABOLIC PANEL
ALT: 14 U/L (ref 0–44)
AST: 19 U/L (ref 15–41)
Albumin: 4.3 g/dL (ref 3.5–5.0)
Alkaline Phosphatase: 56 U/L (ref 38–126)
Anion gap: 9 (ref 5–15)
BUN: 9 mg/dL (ref 6–20)
CO2: 26 mmol/L (ref 22–32)
Calcium: 9.1 mg/dL (ref 8.9–10.3)
Chloride: 104 mmol/L (ref 98–111)
Creatinine, Ser: 0.93 mg/dL (ref 0.44–1.00)
GFR, Estimated: >60 mL/min (ref >60)
Glucose, Bld: 87 mg/dL (ref 70–99)
Potassium: 3.5 mmol/L (ref 3.5–5.1)
Sodium: 139 mmol/L (ref 135–145)
Total Bilirubin: 0.6 mg/dL (ref 0.3–1.2)
Total Protein: 7.7 g/dL (ref 6.5–8.1)

## 2022-12-01 LAB — CBC WITH DIFFERENTIAL/PLATELET
Abs Immature Granulocytes: 0.01 10*3/uL (ref 0.00–0.07)
Basophils Absolute: 0 10*3/uL (ref 0.0–0.1)
Basophils Relative: 0 %
Eosinophils Absolute: 0 10*3/uL (ref 0.0–0.5)
Eosinophils Relative: 0 %
HCT: 42.8 % (ref 36.0–46.0)
Hemoglobin: 13.4 g/dL (ref 12.0–15.0)
Immature Granulocytes: 0 %
Lymphocytes Relative: 19 %
Lymphs Abs: 0.9 10*3/uL (ref 0.7–4.0)
MCH: 26.9 pg (ref 26.0–34.0)
MCHC: 31.3 g/dL (ref 30.0–36.0)
MCV: 85.8 fL (ref 80.0–100.0)
Monocytes Absolute: 0.4 10*3/uL (ref 0.1–1.0)
Monocytes Relative: 9 %
Neutro Abs: 3.2 10*3/uL (ref 1.7–7.7)
Neutrophils Relative %: 72 %
Platelets: 243 10*3/uL (ref 150–400)
RBC: 4.99 MIL/uL (ref 3.87–5.11)
RDW: 14.4 % (ref 11.5–15.5)
WBC: 4.6 10*3/uL (ref 4.0–10.5)
nRBC: 0 % (ref 0.0–0.2)

## 2022-12-01 LAB — HEPATITIS C ANTIBODY: HCV Ab: NONREACTIVE

## 2022-12-01 LAB — POCT URINALYSIS DIPSTICK, ED / UC
Bilirubin Urine: NEGATIVE
Glucose, UA: NEGATIVE mg/dL
Ketones, ur: NEGATIVE mg/dL
Leukocytes,Ua: NEGATIVE
Nitrite: NEGATIVE
Protein, ur: NEGATIVE mg/dL
Specific Gravity, Urine: >=1.03 (ref 1.005–1.030)
Urobilinogen, UA: 0.2 mg/dL (ref 0.0–1.0)
pH: 5 (ref 5.0–8.0)

## 2022-12-01 LAB — POC URINE PREG, ED: Preg Test, Ur: NEGATIVE

## 2022-12-01 MED ORDER — ONDANSETRON 4 MG PO TBDP: 4.0000 mg | ORAL_TABLET | Freq: Three times a day (TID) | ORAL | 0 refills | Status: DC | PRN

## 2022-12-01 NOTE — ED Provider Notes (Signed)
[facility]    CSN: [id number] Arrival date & time: 12/01/22  1202      History   Chief Complaint Chief Complaint  Patient presents with   Generalized Body Aches   Diarrhea   STI Check    HPI Jennifer Lynn is a 26 y.o. female.   Patient presents today with a 24-hour history of chills, fatigue, abdominal pain, nausea, vomiting, diarrhea.  Reports having 3-4 watery bowel movements without blood or mucus.  She reports last episode of emesis was yesterday.  She denies any unusual food intake, known sick contacts, antibiotic use, recent travel.  She denies any urinary symptoms including frequency, urgency, hematuria.  Denies any pelvic pain or vaginal discharge but is interested in STI panel.  She does have a history of recurrent dehydration is concern for dehydration given her symptoms.  She denies any cough, congestion, fever, nausea, vomiting.  She reports abdominal pain is rated 7 on a 0-10 pain scale, localized to lower abdomen, no aggravating relieving factors identified.  She does not believe that she could be pregnant but is open to testing.  She has had cesarean section but denies additional abdominal surgery and still has appendix.    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   Anemia 06/03/2021   GERD (gastroesophageal reflux disease) 06/03/2021   History of pre-eclampsia 10/17/2017   Alpha thalassemia trait 03/17/2017   Morbid obesity with BMI of 40.0-44.9, adult (HCC) 06/26/2015    Past Surgical History:  Procedure Laterality Date   CESAREAN SECTION N/A 09/16/2017   Procedure: CESAREAN SECTION;  Surgeon: Berlin Bing, MD;  Location: [facility] BIRTHING SUITES;  Service: Obstetrics;   Laterality: N/A;   CESAREAN SECTION N/A 10/31/2020   Procedure: CESAREAN SECTION;  Surgeon: Adam Phenix, MD;  Location: [facility];  Service: Obstetrics;  Laterality: N/A;    OB History     Gravida  2   Para  2   Term  2   Preterm  0   AB  0   Living  2      SAB  0   IAB  0   Ectopic  0   Multiple  0   Live Births  2            Home Medications    Prior to Admission medications   Medication Sig Start Date End Date Taking? Authorizing Provider  ondansetron (ZOFRAN-ODT) 4 MG disintegrating tablet Take 1 tablet (4 mg total) by mouth every 8 (eight) hours as needed for nausea or vomiting. 12/01/22  Yes Doxie Augenstein, Noberto Retort, PA-C    Family History Family History  Problem Relation Age of Onset   Diabetes Maternal Grandmother    Diabetes Paternal Grandmother    Hypertension Mother    Hypertension Father     Social History Social History   Tobacco Use   Smoking status: Never   Smokeless tobacco: Never  Vaping Use   Vaping Use: Never used  Substance Use Topics   Alcohol use: Not Currently   Drug use: Never     Allergies   Patient has no known allergies.   Review of Systems Review of Systems  Constitutional:  Positive for activity change, appetite change, chills and fatigue. Negative for fever.  HENT:  Negative for congestion, sinus pressure, sneezing and sore throat.   Respiratory:  Negative for cough and shortness of breath.   Cardiovascular:  Negative for chest pain.  Gastrointestinal:  Positive for abdominal pain, diarrhea, nausea and vomiting.  Genitourinary:  Negative for dysuria, frequency, pelvic pain, urgency, vaginal bleeding, vaginal discharge and vaginal pain.  Musculoskeletal:  Positive for arthralgias, back pain and myalgias.     Physical Exam Triage Vital Signs ED Triage Vitals  Enc Vitals Group     BP 12/01/22 1642 119/87     Pulse Rate 12/01/22 1642 94     Resp 12/01/22 1642 18     Temp 12/01/22 1642 99.2 F (37.3 C)      Temp Source 12/01/22 1642 Oral     SpO2 12/01/22 1642 97 %     Weight --      Height --      Head Circumference --      Peak Flow --      Pain Score 12/01/22 1643 7     Pain Loc --      Pain Edu? --      Excl. in GC? --    No data found.  Updated Vital Signs BP 119/87   Pulse 94   Temp 99.2 F (37.3 C) (Oral)   Resp 18   LMP 11/06/2022 (Approximate)   SpO2 97%   Visual Acuity Right Eye Distance:   Left Eye Distance:   Bilateral Distance:    Right Eye Near:   Left Eye Near:    Bilateral Near:     Physical Exam Vitals reviewed.  Constitutional:      General: She is awake. She is not in acute distress.    Appearance: Normal appearance. She is well-developed. She is not ill-appearing.     Comments: Very pleasant female appears stated age in no acute distress sitting comfortably in exam room  HENT:     Head: Normocephalic and atraumatic.     Right Ear: Tympanic membrane, ear canal and external ear normal. Tympanic membrane is not erythematous or bulging.     Left Ear: Tympanic membrane, ear canal and external ear normal. Tympanic membrane is not erythematous or bulging.     Nose:     Right Sinus: No maxillary sinus tenderness or frontal sinus tenderness.     Left Sinus: No maxillary sinus tenderness or frontal sinus tenderness.     Mouth/Throat:     Pharynx: Uvula midline. No oropharyngeal exudate or posterior oropharyngeal erythema.  Cardiovascular:     Rate and Rhythm: Normal rate and regular rhythm.     Heart sounds: Normal heart sounds, S1 normal and S2 normal. No murmur heard. Pulmonary:     Effort: Pulmonary effort is normal.     Breath sounds: Normal breath sounds. No wheezing, rhonchi or rales.     Comments: Clear to auscultation bilaterally Abdominal:     Tenderness: There is abdominal tenderness in the right lower quadrant and suprapubic area. There is no right CVA tenderness, left CVA tenderness, guarding or rebound. Negative signs include Rovsing's sign,  McBurney's sign and psoas sign.     Comments: Tenderness to palpation in the suprapubic region and right lower quadrant.  No evidence of acute abdomen on physical exam  Lymphadenopathy:     Head:     Right side of head: No submental, submandibular or tonsillar adenopathy.  Left side of head: No submental, submandibular or tonsillar adenopathy.     Cervical: No cervical adenopathy.  Psychiatric:        Behavior: Behavior is cooperative.      UC Treatments / Results  Labs (all labs ordered are listed, but only abnormal results are displayed) Labs Reviewed  POCT URINALYSIS DIPSTICK, ED / UC - Abnormal; Notable for the following components:      Result Value   Hgb urine dipstick TRACE (*)    All other components within normal limits  RESP PANEL BY RT-PCR (FLU A&B, COVID) ARPGX2  COMPREHENSIVE METABOLIC PANEL  CBC WITH DIFFERENTIAL/PLATELET  HIV ANTIBODY (ROUTINE TESTING W REFLEX)  RPR  HEPATITIS C ANTIBODY  POC URINE PREG, ED  CERVICOVAGINAL ANCILLARY ONLY    EKG   Radiology No results found.  Procedures Procedures (including critical care time)  Medications Ordered in UC Medications - No data to display  Initial Impression / Assessment and Plan / UC Course  I have reviewed the triage vital signs and the nursing notes.  Pertinent labs & imaging results that were available during my care of the patient were reviewed by me and considered in my medical decision making (see chart for details).     Patient is well-appearing, afebrile, nontoxic, nontachycardic.  No indication for emergent evaluation or imaging based on exam today.  Urine pregnancy was negative in clinic.  UA was obtained that showed trace hemoglobin that has been present for approximately 4 years.  STI swab was collected today as well as blood testing for hepatitis, RPR, HIV per patient request.  She denies any significant pelvic pain or vaginal discharge and symptoms are more consistent with viral  gastroenteritis.  Patient did request flu and COVID testing which was obtained today.  She is a candidate for antiviral therapy for flu if positive as she is within 48 hours of symptom onset.  She is young and otherwise healthy with no significant comorbidities so not a candidate for COVID antivirals.  Recommended she use Tylenol, ibuprofen, rest, fluids for symptom management.  She was provided a prescription for Zofran to have on hand for nausea and vomiting symptoms with instruction to eat a bland diet and use this as needed.  Discussed that if she develops any additional symptoms she should return for reevaluation.  If her symptoms or not improving within a few days that she is to return.  Discussed that if she has any worsening symptoms she needs to be seen immediately including severe focal abdominal pain, recurrent nausea/vomiting, melena, hematochezia, hematemesis, fever, chest pain, shortness of breath.  She was provided work excuse note.  Final Clinical Impressions(s) / UC Diagnoses   Final diagnoses:  Gastroenteritis  Chills (without fever)  Malaise and fatigue  Routine screening for STI (sexually transmitted infection)     Discharge Instructions      Your urine did not show any signs of a urinary tract infection.  Your urine pregnancy was negative.  We will contact you with your blood work, STI swab, COVID/flu testing if you are positive and require any treatment.  Please monitor your MyChart for these results.  Use Zofran every 8 hours as needed for nausea.  Make sure you rest and drink plenty of fluid.  Alternate Tylenol ibuprofen.  If you develop any additional symptoms including severe abdominal pain, recurrent nausea/vomiting, fever, blood in your stool, cough/congestion, chest pain, shortness of breath you need to be seen immediately.     ED Prescriptions  Medication Sig Dispense Auth. Provider   ondansetron (ZOFRAN-ODT) 4 MG disintegrating tablet Take 1 tablet (4 mg  total) by mouth every 8 (eight) hours as needed for nausea or vomiting. 20 tablet Kailena Lubas, Noberto Retort, PA-C      PDMP not reviewed this encounter.   Jeani Hawking, PA-C 12/01/22 1730

## 2022-12-01 NOTE — Discharge Instructions (Signed)
Your urine did not show any signs of a urinary tract infection.  Your urine pregnancy was negative.  We will contact you with your blood work, STI swab, COVID/flu testing if you are positive and require any treatment.  Please monitor your MyChart for these results.  Use Zofran every 8 hours as needed for nausea.  Make sure you rest and drink plenty of fluid.  Alternate Tylenol ibuprofen.  If you develop any additional symptoms including severe abdominal pain, recurrent nausea/vomiting, fever, blood in your stool, cough/congestion, chest pain, shortness of breath you need to be seen immediately.

## 2022-12-01 NOTE — ED Triage Notes (Addendum)
Vomiting, diarrhea, chills, body aches onset last night. Today c/o body pain, HA, and chills; states vomiting has resolved, but continues with diarrhea. Has been taking PO fluids well. Denies any cough or congestion. Pt also requesting pregnancy test and STIs. Denies any vaginal discharge.

## 2022-12-02 ENCOUNTER — Telehealth (HOSPITAL_COMMUNITY): Payer: Self-pay | Admitting: Physician Assistant

## 2022-12-02 LAB — CERVICOVAGINAL ANCILLARY ONLY
Bacterial Vaginitis (gardnerella): POSITIVE — AB
Candida Glabrata: NEGATIVE
Candida Vaginitis: NEGATIVE
Chlamydia: POSITIVE — AB
Comment: NEGATIVE
Comment: NEGATIVE
Comment: NEGATIVE
Comment: NEGATIVE
Comment: NEGATIVE
Comment: NORMAL
Neisseria Gonorrhea: NEGATIVE
Trichomonas: NEGATIVE

## 2022-12-02 LAB — RESP PANEL BY RT-PCR (FLU A&B, COVID) ARPGX2
Influenza A by PCR: NEGATIVE
Influenza B by PCR: NEGATIVE
SARS Coronavirus 2 by RT PCR: NEGATIVE

## 2022-12-02 LAB — RPR: RPR Ser Ql: NONREACTIVE

## 2022-12-02 MED ORDER — DOXYCYCLINE HYCLATE 100 MG PO CAPS: 100.0000 mg | ORAL_CAPSULE | Freq: Two times a day (BID) | ORAL | 0 refills | Status: DC

## 2022-12-02 MED ORDER — METRONIDAZOLE 500 MG PO TABS: 500.0000 mg | ORAL_TABLET | Freq: Two times a day (BID) | ORAL | 0 refills | Status: DC

## 2022-12-02 NOTE — Telephone Encounter (Signed)
Patient contacted clinic requesting additional information and treatment for swab that she was able to review in MyChart.  Called her and discussed results.  She is positive for chlamydia and bacterial vaginosis.  Doxycycline 100 mg twice daily for 7 days sent to pharmacy we discussed that she should  avoid prolonged exposure while on this medication due to associated photosensitivity.  Metronidazole 500 mg twice daily for 7 days sent to pharmacy.  Discussed that this can cause vomiting and she is to avoid alcohol while on this medication for several days after completing course.  She is to abstain from sex for minimum of 7 days after completing course of medication (at least 2 weeks).  Discussed the importance of safe sex practices.  All questions answered to patient satisfaction.

## 2023-01-21 DIAGNOSIS — N760 Acute vaginitis: Secondary | ICD-10-CM | POA: Diagnosis not present

## 2023-02-02 ENCOUNTER — Encounter (HOSPITAL_BASED_OUTPATIENT_CLINIC_OR_DEPARTMENT_OTHER): Payer: Self-pay | Admitting: Emergency Medicine

## 2023-02-02 ENCOUNTER — Emergency Department (HOSPITAL_BASED_OUTPATIENT_CLINIC_OR_DEPARTMENT_OTHER): Payer: Medicaid Other | Admitting: Radiology

## 2023-02-02 ENCOUNTER — Emergency Department (HOSPITAL_BASED_OUTPATIENT_CLINIC_OR_DEPARTMENT_OTHER)
Admission: EM | Admit: 2023-02-02 | Discharge: 2023-02-02 | Disposition: A | Discharge status: Home / Self Care | Payer: Medicaid Other | Attending: Emergency Medicine | Admitting: Emergency Medicine

## 2023-02-02 ENCOUNTER — Other Ambulatory Visit (HOSPITAL_BASED_OUTPATIENT_CLINIC_OR_DEPARTMENT_OTHER): Payer: Self-pay

## 2023-02-02 ENCOUNTER — Other Ambulatory Visit: Payer: Self-pay

## 2023-02-02 DIAGNOSIS — W19XXXA Unspecified fall, initial encounter: Secondary | ICD-10-CM | POA: Diagnosis not present

## 2023-02-02 DIAGNOSIS — S93602A Unspecified sprain of left foot, initial encounter: Secondary | ICD-10-CM | POA: Diagnosis not present

## 2023-02-02 DIAGNOSIS — S93692A Other sprain of left foot, initial encounter: Secondary | ICD-10-CM | POA: Diagnosis not present

## 2023-02-02 DIAGNOSIS — R609 Edema, unspecified: Secondary | ICD-10-CM | POA: Diagnosis not present

## 2023-02-02 DIAGNOSIS — X509XXA Other and unspecified overexertion or strenuous movements or postures, initial encounter: Secondary | ICD-10-CM | POA: Diagnosis not present

## 2023-02-02 DIAGNOSIS — Z043 Encounter for examination and observation following other accident: Secondary | ICD-10-CM | POA: Diagnosis not present

## 2023-02-02 DIAGNOSIS — S99912A Unspecified injury of left ankle, initial encounter: Secondary | ICD-10-CM | POA: Diagnosis present

## 2023-02-02 MED ORDER — IBUPROFEN 400 MG PO TABS
600.0000 mg | ORAL_TABLET | Freq: Once | ORAL | Status: AC
  Administered 2023-02-02: 600 mg via ORAL
  Filled 2023-02-02: qty 1

## 2023-02-02 NOTE — Discharge Instructions (Signed)
Please read and follow all provided instructions.  Your diagnoses today include:  1. Sprain of left foot, initial encounter     Tests performed today include: An x-ray of the affected areas - do NOT show any broken bones Vital signs. See below for your results today.   Medications prescribed:  Please use over-the-counter NSAID medications (ibuprofen, naproxen) or Tylenol (acetaminophen) as directed on the packaging for pain -- as long as you do not have any reasons avoid these medications. Reasons to avoid NSAID medications include: weak kidneys, a history of bleeding in your stomach or gut, or uncontrolled high blood pressure or previous heart attack. Reasons to avoid Tylenol include: liver problems or ongoing alcohol use. Never take more than 4018m or 8 Extra strength Tylenol in a 24 hour period.     Take any prescribed medications only as directed.  Home care instructions:  Follow any educational materials contained in this packet Follow R.I.C.E. Protocol: R - rest your injury  I  - use ice on injury without applying directly to skin C - compress injury with bandage or splint E - elevate the injury as much as possible  Follow-up instructions: Please follow-up with your primary care provider or the provided orthopedic physician (bone specialist) if you continue to have significant pain in 1 week. In this case you may have a more severe injury that requires further care.   Return instructions:  Please return if your toes or feet are numb or tingling, appear gray or blue, or you have severe pain (also elevate the leg and loosen splint or wrap if you were given one) Please return to the Emergency Department if you experience worsening symptoms.  Please return if you have any other emergent concerns.  Additional Information:  Your vital signs today were: BP (!) 146/98 (BP Location: Right Wrist)   Pulse (!) 52   Temp 98.8 F (37.1 C) (Oral)   Resp 18   LMP 01/26/2023   SpO2 100%   If your blood pressure (BP) was elevated above 135/85 this visit, please have this repeated by your doctor within one month. --------------

## 2023-02-02 NOTE — ED Notes (Signed)
Pt sitting in chair, pt has lace up brace on ankle, pt has crutches, reviewed crutch education.  Pt verbalized and demonstrated understanding.  Pt reports decreased pain, pt verbalized understanding d/c and follow up, pt from dpt with family

## 2023-02-02 NOTE — ED Provider Notes (Signed)
[facility] Provider Note   CSN: [id number] Arrival date & time: 02/02/23  [id number]     History  Chief Complaint  Patient presents with   [facility] Jennifer Lynn is a 27 y.o. female.  Patient presents to the ED for evaluation of left ankle and foot pain.  Symptoms started acutely just prior to arrival when she was walking down some stairs.  She thinks that her foot twisted out.  She has had pain in the top of her foot and through the arch.  Pain is worse with movement.  Initially she had pain up the medial lower leg however this has resolved.  She did not hit her head.  No treatments prior to arrival.       Home Medications Prior to Admission medications   Medication Sig Start Date End Date Taking? Authorizing Provider  doxycycline (VIBRAMYCIN) 100 MG capsule Take 1 capsule (100 mg total) by mouth 2 (two) times daily. 12/02/22   Raspet, Derry Skill, PA-C  metroNIDAZOLE (FLAGYL) 500 MG tablet Take 1 tablet (500 mg total) by mouth 2 (two) times daily. 12/02/22   Raspet, Derry Skill, PA-C  ondansetron (ZOFRAN-ODT) 4 MG disintegrating tablet Take 1 tablet (4 mg total) by mouth every 8 (eight) hours as needed for nausea or vomiting. 12/01/22   Raspet, Derry Skill, PA-C      Allergies    Patient has no known allergies.    Review of Systems   Review of Systems  Physical Exam Updated Vital Signs BP (!) 146/98 (BP Location: Right Wrist)   Pulse (!) 52   Temp 98.8 F (37.1 C) (Oral)   Resp 18   LMP 01/26/2023   SpO2 100%   Physical Exam Vitals and nursing note reviewed.  Constitutional:      Appearance: She is well-developed.  HENT:     Head: Normocephalic and atraumatic.  Eyes:     Pupils: Pupils are equal, round, and reactive to light.  Cardiovascular:     Pulses: No decreased pulses.          Dorsalis pedis pulses are 2+ on the left side.  Musculoskeletal:        General: Tenderness present.     Cervical back: Normal range of motion and  neck supple.     Left knee: No bony tenderness. Normal range of motion. No tenderness.     Left lower leg: No tenderness.     Left ankle: No swelling. Tenderness present. No base of 5th metatarsal or proximal fibula tenderness. Normal range of motion.     Left foot: Normal range of motion and normal capillary refill. Tenderness (dorsum) present. No bony tenderness.  Skin:    General: Skin is warm and dry.  Neurological:     Mental Status: She is alert.     Sensory: No sensory deficit.     Comments: Motor, sensation, and vascular distal to the injury is fully intact.   Psychiatric:        Mood and Affect: Mood normal.     ED Results / Procedures / Treatments   Labs (all labs ordered are listed, but only abnormal results are displayed) Labs Reviewed - No data to display  EKG None  Radiology No results found.  Procedures Procedures    Medications Ordered in ED Medications - No data to display  ED Course/ Medical Decision Making/ A&P    Patient seen and examined. History obtained directly from patient.  Labs/EKG: None ordered  Imaging: Ordered x-ray of the left foot and ankle.  Medications/Fluids: Ordered: Oral ibuprofen.   Most recent vital signs reviewed and are as follows: BP (!) 146/98 (BP Location: Right Wrist)   Pulse (!) 52   Temp 98.8 F (37.1 C) (Oral)   Resp 18   LMP 01/26/2023   SpO2 100%   Initial impression: Ankle pain and injury  10:25 AM Reassessment performed. Patient appears stable.  Family member has crutches.  Will place Ace wrap.  Imaging personally visualized and interpreted including: X-ray foot and ankle are negative  Reviewed pertinent lab work and imaging with patient at bedside. Questions answered.   Most current vital signs reviewed and are as follows: BP (!) 146/98 (BP Location: Right Wrist)   Pulse (!) 52   Temp 98.8 F (37.1 C) (Oral)   Resp 18   LMP 01/26/2023   SpO2 100%   Plan: Discharge to home.   Prescriptions  written for: None  Other home care instructions discussed: RICE protocol, OTC meds  ED return instructions discussed: Worsening symptoms or other concerns  Follow-up instructions discussed: Patient encouraged to follow-up with their PCP in 5-7 days.                             Medical Decision Making Amount and/or Complexity of Data Reviewed Radiology: ordered.   Patient with foot and ankle sprain after a fall this morning.  X-rays negative.  Lower extremity is neurovascularly intact.  Will treat conservatively with PCP/Ortho follow-up as needed.        Final Clinical Impression(s) / ED Diagnoses Final diagnoses:  Sprain of left foot, initial encounter    Rx / DC Orders ED Discharge Orders     None         Carlisle Cater, PA-C 02/02/23 1026    Sherwood Gambler, MD 02/02/23 1453

## 2023-02-02 NOTE — ED Triage Notes (Signed)
Pt was going down stairs carrying large box and slipped on clothing on carpeted steps. Left ankle pain and swelling. NO LOC or thinners.

## 2023-02-11 ENCOUNTER — Other Ambulatory Visit (HOSPITAL_COMMUNITY): Payer: Self-pay | Admitting: Orthopaedic Surgery

## 2023-02-11 ENCOUNTER — Ambulatory Visit (HOSPITAL_COMMUNITY)
Admission: RE | Admit: 2023-02-11 | Discharge: 2023-02-11 | Disposition: A | Discharge status: Home / Self Care | Payer: Medicaid Other | Source: Ambulatory Visit | Attending: Vascular Surgery | Admitting: Vascular Surgery

## 2023-02-11 DIAGNOSIS — M79605 Pain in left leg: Secondary | ICD-10-CM

## 2023-02-11 DIAGNOSIS — M79672 Pain in left foot: Secondary | ICD-10-CM | POA: Diagnosis not present

## 2023-02-21 DIAGNOSIS — M79672 Pain in left foot: Secondary | ICD-10-CM | POA: Diagnosis not present

## 2023-02-25 ENCOUNTER — Ambulatory Visit: Payer: Medicaid Other | Admitting: Family

## 2023-02-25 ENCOUNTER — Encounter: Payer: Self-pay | Admitting: Family

## 2023-02-25 VITALS — BP 119/81 | HR 71 | Temp 97.8°F | Ht 64.0 in | Wt 230.0 lb

## 2023-02-25 DIAGNOSIS — M79672 Pain in left foot: Secondary | ICD-10-CM | POA: Diagnosis not present

## 2023-02-25 DIAGNOSIS — F411 Generalized anxiety disorder: Secondary | ICD-10-CM | POA: Diagnosis not present

## 2023-02-25 DIAGNOSIS — N920 Excessive and frequent menstruation with regular cycle: Secondary | ICD-10-CM | POA: Diagnosis not present

## 2023-02-25 NOTE — Progress Notes (Signed)
New Patient Office Visit  Subjective:  Patient ID: Jennifer Lynn, female    DOB: 08/27/1996  Age: 27 y.o. MRN: [id number]  CC:  Chief Complaint  Patient presents with   New Patient (Initial Visit)    Would like to make sure she doesn't have fibroids, family HX   Anxiety   HPI [name] presents for establishing care today.  Fibroids:  has been seen by GYN in past, but has been 2 years. Reports heavy bleeding and severe pain with menstrual cycles. Cycles are regular each month, but sometimes last 3 days, sometimes 5-6 days. States the shorter cycles are worse for sx.  Anxiety:  GAD score high - reports having for years, has been seeing a therapist regularly twice a week, but feels she may need to start a medication.  Assessment & Plan:  Menorrhagia with regular cycle - with severe pain, pt will contact her last OB/GYN and let me know if new referral is needed.  Generalized anxiety disorder - Assessment & Plan: chronic pt sees a therapist twice a week GAD score high today, discussed medications, prn vs daily pt does not like to take meds, wants to consider, will let me know discussed lifestyle modifications for anxiety including exercise, listening to music, meditation, yoga, etc, handout also provided. f/u prn  Subjective:    Outpatient Medications Prior to Visit  Medication Sig Dispense Refill   doxycycline (VIBRAMYCIN) 100 MG capsule Take 1 capsule (100 mg total) by mouth 2 (two) times daily. 14 capsule 0   metroNIDAZOLE (FLAGYL) 500 MG tablet Take 1 tablet (500 mg total) by mouth 2 (two) times daily. 14 tablet 0   ondansetron (ZOFRAN-ODT) 4 MG disintegrating tablet Take 1 tablet (4 mg total) by mouth every 8 (eight) hours as needed for nausea or vomiting. 20 tablet 0   No facility-administered medications prior to visit.   Past Medical History:  Diagnosis Date   Anemia    Blood transfusion without reported diagnosis    patient thinks she had after  previous delivery   UTI (urinary tract infection)    Past Surgical History:  Procedure Laterality Date   CESAREAN SECTION N/A 09/16/2017   Procedure: CESAREAN SECTION;  Surgeon: Aletha Halim, MD;  Location: [place];  Service: Obstetrics;  Laterality: N/A;   CESAREAN SECTION N/A 10/31/2020   Procedure: CESAREAN SECTION;  Surgeon: Woodroe Mode, MD;  Location: [facility];  Service: Obstetrics;  Laterality: N/A;    Objective:   Today's Vitals: BP 119/81 (BP Location: Left Arm, Patient Position: Sitting, Cuff Size: Large)   Pulse 71   Temp 97.8 F (36.6 C) (Temporal)   Ht 5\' 4"  (1.626 m)   Wt 230 lb (104.3 kg)   LMP 02/16/2023 (Approximate)   SpO2 98%   BMI 39.48 kg/m   Physical Exam Vitals and nursing note reviewed.  Constitutional:      Appearance: Normal appearance. She is obese.  Cardiovascular:     Rate and Rhythm: Normal rate and regular rhythm.  Pulmonary:     Effort: Pulmonary effort is normal.     Breath sounds: Normal breath sounds.  Musculoskeletal:        General: Normal range of motion.  Skin:    General: Skin is warm and dry.  Neurological:     Mental Status: She is alert.  Psychiatric:        Mood and Affect: Mood normal.        Behavior: Behavior normal.  No orders of the defined types were placed in this encounter.   Jeanie Sewer, NP

## 2023-02-25 NOTE — Patient Instructions (Addendum)
Welcome to [facility], It was a pleasure meeting you today!  . Let me know if your GYN office needs me to send a referral for you.  For your anxiety, Buspirone (Buspar) or Hydroxyzine are medications I use for anxiety and can be taken as needed, but Buspar does work better if at least a low dose is taken daily.  Please schedule a physical with fasting labs at your convenience.    PLEASE NOTE: If you had any LAB tests please let us know if you have not heard back within a few days. You may see your results on MyChart before we have a chance to review them but we will give you a call once they are reviewed by [place]. If we ordered any REFERRALS today, please let us know if you have not heard from their office within the next week.  Let us know through MyChart if you are needing REFILLS, or have your pharmacy send [place] the request. You can also use MyChart to communicate with me or any office staff.  Please try these tips to maintain a healthy lifestyle: It is important that you exercise regularly at least 30 minutes 5 times a week. Think about what you will eat, plan ahead. Choose whole foods, & think  "clean, green, fresh or frozen" over canned, processed or packaged foods which are more sugary, salty, and fatty. 70 to 75% of food eaten should be fresh vegetables and protein. 2-3  meals daily with healthy snacks between meals, but must be whole fruit, protein or vegetables. Aim to eat over a 10 hour period when you are active, for example, 7am to 5pm, and then STOP after your last meal of the day, drinking only water.  Shorter eating windows, 6-8 hours, are showing benefits in heart disease and blood sugar regulation. Drink water every day! Shoot for 64 ounces daily = 8 cups, no other drink is as healthy! Fruit juice is best enjoyed in a healthy way, by EATING the fruit.

## 2023-02-25 NOTE — Assessment & Plan Note (Addendum)
chronic pt sees a therapist twice a week GAD score high today, discussed medications, prn vs daily pt does not like to take meds, wants to consider, will let me know discussed lifestyle modifications for anxiety including exercise, listening to music, meditation, yoga, etc, handout also provided. f/u prn

## 2023-03-02 ENCOUNTER — Other Ambulatory Visit: Payer: Self-pay | Admitting: Orthopaedic Surgery

## 2023-03-02 DIAGNOSIS — M79672 Pain in left foot: Secondary | ICD-10-CM | POA: Diagnosis not present

## 2023-03-08 ENCOUNTER — Encounter (HOSPITAL_COMMUNITY): Payer: Self-pay | Admitting: Orthopaedic Surgery

## 2023-03-08 ENCOUNTER — Other Ambulatory Visit: Payer: Self-pay

## 2023-03-08 NOTE — Progress Notes (Signed)
Spoke with pt for pre-op call. Pt denies cardiac history, HTN or Diabetes.   Shower instructions given to pt and she voiced understanding.  

## 2023-03-09 DIAGNOSIS — M79672 Pain in left foot: Secondary | ICD-10-CM | POA: Diagnosis not present

## 2023-03-10 ENCOUNTER — Encounter (HOSPITAL_COMMUNITY)
Admission: RE | Disposition: A | Discharge status: Home / Self Care | Payer: Self-pay | Source: Home / Self Care | Attending: Orthopaedic Surgery

## 2023-03-10 ENCOUNTER — Other Ambulatory Visit: Payer: Self-pay

## 2023-03-10 ENCOUNTER — Ambulatory Visit (HOSPITAL_COMMUNITY)
Admission: RE | Admit: 2023-03-10 | Discharge: 2023-03-10 | Disposition: A | Discharge status: Home / Self Care | Payer: Medicaid Other | Attending: Orthopaedic Surgery | Admitting: Orthopaedic Surgery

## 2023-03-10 ENCOUNTER — Encounter (HOSPITAL_COMMUNITY): Payer: Self-pay | Admitting: Orthopaedic Surgery

## 2023-03-10 ENCOUNTER — Ambulatory Visit (HOSPITAL_COMMUNITY): Payer: Medicaid Other

## 2023-03-10 ENCOUNTER — Ambulatory Visit (HOSPITAL_BASED_OUTPATIENT_CLINIC_OR_DEPARTMENT_OTHER): Payer: Medicaid Other | Admitting: Anesthesiology

## 2023-03-10 ENCOUNTER — Ambulatory Visit (HOSPITAL_COMMUNITY): Payer: Medicaid Other | Admitting: Anesthesiology

## 2023-03-10 DIAGNOSIS — S92232A Displaced fracture of intermediate cuneiform of left foot, initial encounter for closed fracture: Secondary | ICD-10-CM | POA: Diagnosis not present

## 2023-03-10 DIAGNOSIS — M79672 Pain in left foot: Secondary | ICD-10-CM

## 2023-03-10 DIAGNOSIS — S92332A Displaced fracture of third metatarsal bone, left foot, initial encounter for closed fracture: Secondary | ICD-10-CM | POA: Diagnosis not present

## 2023-03-10 DIAGNOSIS — S92322A Displaced fracture of second metatarsal bone, left foot, initial encounter for closed fracture: Secondary | ICD-10-CM | POA: Insufficient documentation

## 2023-03-10 DIAGNOSIS — W19XXXA Unspecified fall, initial encounter: Secondary | ICD-10-CM | POA: Insufficient documentation

## 2023-03-10 DIAGNOSIS — D759 Disease of blood and blood-forming organs, unspecified: Secondary | ICD-10-CM | POA: Insufficient documentation

## 2023-03-10 DIAGNOSIS — S92312A Displaced fracture of first metatarsal bone, left foot, initial encounter for closed fracture: Secondary | ICD-10-CM | POA: Insufficient documentation

## 2023-03-10 DIAGNOSIS — S92302A Fracture of unspecified metatarsal bone(s), left foot, initial encounter for closed fracture: Secondary | ICD-10-CM | POA: Diagnosis not present

## 2023-03-10 DIAGNOSIS — F419 Anxiety disorder, unspecified: Secondary | ICD-10-CM | POA: Diagnosis not present

## 2023-03-10 DIAGNOSIS — S92342A Displaced fracture of fourth metatarsal bone, left foot, initial encounter for closed fracture: Secondary | ICD-10-CM | POA: Diagnosis not present

## 2023-03-10 DIAGNOSIS — D649 Anemia, unspecified: Secondary | ICD-10-CM | POA: Insufficient documentation

## 2023-03-10 DIAGNOSIS — Z6838 Body mass index (BMI) 38.0-38.9, adult: Secondary | ICD-10-CM | POA: Insufficient documentation

## 2023-03-10 DIAGNOSIS — S93325A Dislocation of tarsometatarsal joint of left foot, initial encounter: Secondary | ICD-10-CM

## 2023-03-10 DIAGNOSIS — S92242A Displaced fracture of medial cuneiform of left foot, initial encounter for closed fracture: Secondary | ICD-10-CM | POA: Insufficient documentation

## 2023-03-10 DIAGNOSIS — G8918 Other acute postprocedural pain: Secondary | ICD-10-CM | POA: Diagnosis not present

## 2023-03-10 DIAGNOSIS — S92812A Other fracture of left foot, initial encounter for closed fracture: Secondary | ICD-10-CM | POA: Diagnosis not present

## 2023-03-10 HISTORY — PX: REPAIR EXTENSOR TENDON WITH METATARSAL OSTEOTOMY AND OPEN REDUCTION IN: SHX5698

## 2023-03-10 HISTORY — DX: Gastro-esophageal reflux disease without esophagitis: K21.9

## 2023-03-10 HISTORY — DX: Anxiety disorder, unspecified: F41.9

## 2023-03-10 HISTORY — PX: OPEN REDUCTION INTERNAL FIXATION (ORIF) FOOT LISFRANC FRACTURE: SHX5990

## 2023-03-10 LAB — CBC
HCT: 41.6 % (ref 36.0–46.0)
Hemoglobin: 13.6 g/dL (ref 12.0–15.0)
MCH: 27.1 pg (ref 26.0–34.0)
MCHC: 32.7 g/dL (ref 30.0–36.0)
MCV: 82.9 fL (ref 80.0–100.0)
Platelets: 265 10*3/uL (ref 150–400)
RBC: 5.02 MIL/uL (ref 3.87–5.11)
RDW: 13.5 % (ref 11.5–15.5)
WBC: 5.9 10*3/uL (ref 4.0–10.5)
nRBC: 0 % (ref 0.0–0.2)

## 2023-03-10 LAB — POCT PREGNANCY, URINE: Preg Test, Ur: NEGATIVE

## 2023-03-10 SURGERY — OPEN REDUCTION INTERNAL FIXATION (ORIF) FOOT LISFRANC FRACTURE
Anesthesia: General | Site: Foot | Laterality: Left

## 2023-03-10 MED ORDER — LACTATED RINGERS IV SOLN: INTRAVENOUS | Status: DC

## 2023-03-10 MED ORDER — SUGAMMADEX SODIUM 500 MG/5ML IV SOLN
INTRAVENOUS | Status: AC
  Filled 2023-03-10: qty 5

## 2023-03-10 MED ORDER — FENTANYL CITRATE (PF) 250 MCG/5ML IJ SOLN
INTRAMUSCULAR | Status: AC
  Filled 2023-03-10: qty 5

## 2023-03-10 MED ORDER — MIDAZOLAM HCL 2 MG/2ML IJ SOLN: 2.0000 mg | Freq: Once | INTRAMUSCULAR | Status: AC

## 2023-03-10 MED ORDER — FENTANYL CITRATE (PF) 250 MCG/5ML IJ SOLN
INTRAMUSCULAR | Status: DC | PRN
  Administered 2023-03-10 (×2): 25 ug via INTRAVENOUS
  Administered 2023-03-10: 50 ug via INTRAVENOUS

## 2023-03-10 MED ORDER — BUPIVACAINE-EPINEPHRINE (PF) 0.5% -1:200000 IJ SOLN
INTRAMUSCULAR | Status: DC | PRN
  Administered 2023-03-10: 20 mL via PERINEURAL
  Administered 2023-03-10: 15 mL via PERINEURAL

## 2023-03-10 MED ORDER — FENTANYL CITRATE (PF) 100 MCG/2ML IJ SOLN
INTRAMUSCULAR | Status: AC
  Administered 2023-03-10: 100 ug via INTRAVENOUS
  Filled 2023-03-10: qty 2

## 2023-03-10 MED ORDER — PROPOFOL 10 MG/ML IV BOLUS
INTRAVENOUS | Status: DC | PRN
  Administered 2023-03-10: 200 mg via INTRAVENOUS

## 2023-03-10 MED ORDER — KETOROLAC TROMETHAMINE 30 MG/ML IJ SOLN
INTRAMUSCULAR | Status: AC
  Filled 2023-03-10: qty 2

## 2023-03-10 MED ORDER — ACETAMINOPHEN 500 MG PO TABS
1000.0000 mg | ORAL_TABLET | Freq: Once | ORAL | Status: AC
  Administered 2023-03-10: 1000 mg via ORAL
  Filled 2023-03-10: qty 2

## 2023-03-10 MED ORDER — OXYCODONE HCL 5 MG PO TABS: 5.0000 mg | ORAL_TABLET | ORAL | 0 refills | Status: DC | PRN

## 2023-03-10 MED ORDER — MIDAZOLAM HCL 2 MG/2ML IJ SOLN
INTRAMUSCULAR | Status: AC
  Administered 2023-03-10: 2 mg via INTRAVENOUS
  Filled 2023-03-10: qty 2

## 2023-03-10 MED ORDER — ONDANSETRON HCL 4 MG/2ML IJ SOLN
INTRAMUSCULAR | Status: DC | PRN
  Administered 2023-03-10: 4 mg via INTRAVENOUS

## 2023-03-10 MED ORDER — BUPIVACAINE LIPOSOME 1.3 % IJ SUSP
INTRAMUSCULAR | Status: DC | PRN
  Administered 2023-03-10: 10 mL via PERINEURAL

## 2023-03-10 MED ORDER — HYDROMORPHONE HCL 1 MG/ML IJ SOLN: 0.2500 mg | INTRAMUSCULAR | Status: DC | PRN

## 2023-03-10 MED ORDER — CHLORHEXIDINE GLUCONATE 0.12 % MT SOLN
15.0000 mL | Freq: Once | OROMUCOSAL | Status: AC
  Administered 2023-03-10: 15 mL via OROMUCOSAL
  Filled 2023-03-10: qty 15

## 2023-03-10 MED ORDER — KETOROLAC TROMETHAMINE 30 MG/ML IJ SOLN
INTRAMUSCULAR | Status: DC | PRN
  Administered 2023-03-10: 30 mg via INTRAVENOUS

## 2023-03-10 MED ORDER — ONDANSETRON HCL 4 MG/2ML IJ SOLN
INTRAMUSCULAR | Status: AC
  Filled 2023-03-10: qty 2

## 2023-03-10 MED ORDER — ASPIRIN 325 MG PO TABS: ORAL_TABLET | ORAL | 0 refills | Status: DC

## 2023-03-10 MED ORDER — 0.9 % SODIUM CHLORIDE (POUR BTL) OPTIME
TOPICAL | Status: DC | PRN
  Administered 2023-03-10: 1000 mL

## 2023-03-10 MED ORDER — SUGAMMADEX SODIUM 200 MG/2ML IV SOLN
INTRAVENOUS | Status: DC | PRN
  Administered 2023-03-10: 400 mg via INTRAVENOUS

## 2023-03-10 MED ORDER — ORAL CARE MOUTH RINSE: 15.0000 mL | Freq: Once | OROMUCOSAL | Status: AC

## 2023-03-10 MED ORDER — BUPIVACAINE LIPOSOME 1.3 % IJ SUSP
INTRAMUSCULAR | Status: AC
  Filled 2023-03-10: qty 10

## 2023-03-10 MED ORDER — FENTANYL CITRATE (PF) 100 MCG/2ML IJ SOLN: 100.0000 ug | Freq: Once | INTRAMUSCULAR | Status: AC

## 2023-03-10 MED ORDER — LIDOCAINE 2% (20 MG/ML) 5 ML SYRINGE
INTRAMUSCULAR | Status: DC | PRN
  Administered 2023-03-10: 20 mg via INTRAVENOUS

## 2023-03-10 MED ORDER — CEFAZOLIN SODIUM-DEXTROSE 2-4 GM/100ML-% IV SOLN
2.0000 g | INTRAVENOUS | Status: AC
  Administered 2023-03-10: 2 g via INTRAVENOUS
  Filled 2023-03-10: qty 100

## 2023-03-10 MED ORDER — DEXAMETHASONE SODIUM PHOSPHATE 10 MG/ML IJ SOLN
INTRAMUSCULAR | Status: DC | PRN
  Administered 2023-03-10: 10 mg via INTRAVENOUS

## 2023-03-10 MED ORDER — ROCURONIUM BROMIDE 10 MG/ML (PF) SYRINGE
PREFILLED_SYRINGE | INTRAVENOUS | Status: DC | PRN
  Administered 2023-03-10: 60 mg via INTRAVENOUS

## 2023-03-10 SURGICAL SUPPLY — 57 items
APL PRP STRL LF DISP 70% ISPRP (MISCELLANEOUS) ×2
APL SKNCLS STERI-STRIP NONHPOA (GAUZE/BANDAGES/DRESSINGS)
BANDAGE ESMARK 6X9 LF (GAUZE/BANDAGES/DRESSINGS) IMPLANT
BENZOIN TINCTURE PRP APPL 2/3 (GAUZE/BANDAGES/DRESSINGS) IMPLANT
BIT DRILL 2.5 STRGHT CANN (BIT) IMPLANT
BLADE SURG 15 STRL LF DISP TIS (BLADE) ×6 IMPLANT
BLADE SURG 15 STRL SS (BLADE) ×6
BNDG CMPR 5X62 HK CLSR LF (GAUZE/BANDAGES/DRESSINGS) ×2
BNDG CMPR 9X4 STRL LF SNTH (GAUZE/BANDAGES/DRESSINGS)
BNDG CMPR 9X6 STRL LF SNTH (GAUZE/BANDAGES/DRESSINGS)
BNDG ELASTIC 4X5.8 VLCR STR LF (GAUZE/BANDAGES/DRESSINGS) IMPLANT
BNDG ELASTIC 6INX 5YD STR LF (GAUZE/BANDAGES/DRESSINGS) IMPLANT
BNDG ELASTIC 6X5.8 VLCR STR LF (GAUZE/BANDAGES/DRESSINGS) ×4 IMPLANT
BNDG ESMARK 4X9 LF (GAUZE/BANDAGES/DRESSINGS) IMPLANT
BNDG ESMARK 6X9 LF (GAUZE/BANDAGES/DRESSINGS)
CHLORAPREP W/TINT 26 (MISCELLANEOUS) ×2 IMPLANT
COVER BACK TABLE 60X90IN (DRAPES) ×2 IMPLANT
CUFF TOURN SGL QUICK 34 (TOURNIQUET CUFF) ×2
CUFF TRNQT CYL 34X4.125X (TOURNIQUET CUFF) ×2 IMPLANT
DRAPE C-ARM 42X72 X-RAY (DRAPES) ×2 IMPLANT
DRAPE U-SHAPE 47X51 STRL (DRAPES) ×2 IMPLANT
DRSG XEROFORM 1X8 (GAUZE/BANDAGES/DRESSINGS) IMPLANT
ELECT REM PT RETURN 9FT ADLT (ELECTROSURGICAL) ×2
ELECTRODE REM PT RTRN 9FT ADLT (ELECTROSURGICAL) ×2 IMPLANT
GAUZE SPONGE 4X4 12PLY STRL (GAUZE/BANDAGES/DRESSINGS) ×2 IMPLANT
GAUZE XEROFORM 1X8 LF (GAUZE/BANDAGES/DRESSINGS) ×2 IMPLANT
GLOVE BIOGEL M STRL SZ7.5 (GLOVE) ×8 IMPLANT
GLOVE BIOGEL PI IND STRL 8 (GLOVE) ×4 IMPLANT
GOWN STRL REUS W/ TWL XL LVL3 (GOWN DISPOSABLE) ×4 IMPLANT
GOWN STRL REUS W/TWL XL LVL3 (GOWN DISPOSABLE) ×4
K-WIRE TROCAR 1.35 (MISCELLANEOUS) ×2
KIT BIOCARTILAGE LG JOINT MIX (KITS) IMPLANT
KWIRE TROCAR 1.35 (MISCELLANEOUS) IMPLANT
NS IRRIG 1000ML POUR BTL (IV SOLUTION) ×2 IMPLANT
PACK ORTHO EXTREMITY (CUSTOM PROCEDURE TRAY) ×2 IMPLANT
PAD CAST 4YDX4 CTTN HI CHSV (CAST SUPPLIES) ×2 IMPLANT
PADDING CAST COTTON 4X4 STRL (CAST SUPPLIES) ×2
PADDING CAST SYNTHETIC 3X4 NS (CAST SUPPLIES) IMPLANT
PADDING CAST SYNTHETIC 4X4 STR (CAST SUPPLIES) ×2 IMPLANT
PADDING CAST SYNTHETIC 6X4 NS (CAST SUPPLIES) IMPLANT
SCREW LP TIT 3.5X30 (Screw) IMPLANT
SLEEVE SCD COMPRESS KNEE MED (STOCKING) ×2 IMPLANT
SPIKE FLUID TRANSFER (MISCELLANEOUS) IMPLANT
SPLINT PLASTER CAST FAST 5X30 (CAST SUPPLIES) ×40 IMPLANT
STRIP CLOSURE SKIN 1/2X4 (GAUZE/BANDAGES/DRESSINGS) IMPLANT
SUCTION FRAZIER HANDLE 10FR (MISCELLANEOUS) ×2
SUCTION TUBE FRAZIER 10FR DISP (MISCELLANEOUS) ×2 IMPLANT
SUT ETHILON 3 0 PS 1 (SUTURE) ×2 IMPLANT
SUT MNCRL AB 3-0 PS2 18 (SUTURE) ×2 IMPLANT
SUT MNCRL AB 3-0 PS2 27 (SUTURE) IMPLANT
SUT PDS AB 2-0 CT2 27 (SUTURE) ×2 IMPLANT
SUT VIC AB 3-0 FS2 27 (SUTURE) IMPLANT
SYR 5ML LL (SYRINGE) ×2 IMPLANT
SYR BULB EAR ULCER 3OZ GRN STR (SYRINGE) ×2 IMPLANT
TOWEL GREEN STERILE FF (TOWEL DISPOSABLE) ×4 IMPLANT
TUBE CONNECTING 20X1/4 (TUBING) ×2 IMPLANT
UNDERPAD 30X36 HEAVY ABSORB (UNDERPADS AND DIAPERS) ×2 IMPLANT

## 2023-03-10 NOTE — Discharge Instructions (Signed)

## 2023-03-10 NOTE — H&P (Signed)
PREOPERATIVE H&P  Chief Complaint: Left foot pain  HPI: Jennifer Lynn is a 27 y.o. female who presents for preoperative history and physical with a diagnosis of left midfoot fracture dislocations.  She sustained the injury a few weeks ago.  She was seen in the office with the above fractures.  MRI scan demonstrated the injuries.  She was indicated for surgery due to instability notable through the first TMT joint and Lisfranc and second tarsometatarsal joint areas she is here today for surgery.  Symptoms are rated as moderate to severe, and have been worsening.  This is significantly impairing activities of daily living.  She has elected for surgical management.   Past Medical History:  Diagnosis Date   Anemia    Anxiety    Blood transfusion without reported diagnosis    patient thinks she had after previous delivery   GERD (gastroesophageal reflux disease)    UTI (urinary tract infection)    Past Surgical History:  Procedure Laterality Date   CESAREAN SECTION N/A 09/16/2017   Procedure: CESAREAN SECTION;  Surgeon: Aletha Halim, MD;  Location: [facility];  Service: Obstetrics;  Laterality: N/A;   CESAREAN SECTION N/A 10/31/2020   Procedure: CESAREAN SECTION;  Surgeon: Woodroe Mode, MD;  Location: [facility];  Service: Obstetrics;  Laterality: N/A;   WISDOM TOOTH EXTRACTION     Social History   Socioeconomic History   Marital status: Single    Spouse name: Not on file   Number of children: 2   Years of education: Not on file   Highest education level: Not on file  Occupational History   Not on file  Tobacco Use   Smoking status: Never    Passive exposure: Never   Smokeless tobacco: Never  Vaping Use   Vaping Use: Never used  Substance and Sexual Activity   Alcohol use: Not Currently   Drug use: Not Currently   Sexual activity: Yes    Birth control/protection: Condom  Other Topics Concern   Not on file  Social History Narrative   Not on file    Social Determinants of Health   Financial Resource Strain: Not on file  Food Insecurity: No Food Insecurity (10/23/2020)   Hunger Vital Sign    Worried About Running Out of Food in the Last Year: Never true    Ran Out of Food in the Last Year: Never true  Transportation Needs: No Transportation Needs (10/23/2020)   PRAPARE - Hydrologist (Medical): No    Lack of Transportation (Non-Medical): No  Recent Concern: Transportation Needs - Unmet Transportation Needs (09/10/2020)   PRAPARE - Hydrologist (Medical): Yes    Lack of Transportation (Non-Medical): No  Physical Activity: Not on file  Stress: Not on file  Social Connections: Not on file   Family History  Problem Relation Age of Onset   Hypertension Mother    Hypertension Father    Diabetes Maternal Grandmother    Diabetes Paternal Grandmother    No Known Allergies Prior to Admission medications   Not on File     Positive ROS: All other systems have been reviewed and were otherwise negative with the exception of those mentioned in the HPI and as above.  Physical Exam:  Vitals:   03/10/23 1336  BP: 132/89  Pulse: 66  Resp: 18  Temp: 98.9 F (37.2 C)  SpO2: 98%   General: Alert, no acute distress Cardiovascular: No pedal  edema Respiratory: No cyanosis, no use of accessory musculature GI: No organomegaly, abdomen is soft and non-tender Skin: No lesions in the area of chief complaint Neurologic: Sensation intact distally Psychiatric: Patient is competent for consent with normal mood and affect Lymphatic: No axillary or cervical lymphadenopathy  MUSCULOSKELETAL: Left foot demonstrates some swelling.  Tender to palpation along the dorsal aspect of the midfoot.  Sensation grossly intact distally.  Foot is warm and well-perfused.  Assessment: Left midfoot fracture dislocation including first tarsometatarsal joint, Lisfranc joint and second tarsometatarsal  joint with other metatarsal base fractures and TMT joint dislocations   Plan: Plan for open treatment of her midfoot fracture dislocations.  We discussed the risks, benefits and alternatives of surgery which include but are not limited to wound healing complications, infection, nonunion, malunion, need for further surgery, damage to surrounding structures and continued pain.  They understand there is no guarantees to an acceptable outcome.  After weighing these risks they opted to proceed with surgery.     Erle Crocker, MD    03/10/2023 2:38 PM

## 2023-03-10 NOTE — Anesthesia Procedure Notes (Signed)
Anesthesia Regional Block: Adductor canal block   Pre-Anesthetic Checklist: , timeout performed,  Correct Patient, Correct Site, Correct Laterality,  Correct Procedure, Correct Position, site marked,  Risks and benefits discussed,  Pre-op evaluation,  At surgeon's request and post-op pain management  Laterality: Left  Prep: Maximum Sterile Barrier Precautions used, chloraprep       Needles:  Injection technique: Single-shot  Needle Type: Echogenic Stimulator Needle     Needle Length: 9cm  Needle Gauge: 21     Additional Needles:   Procedures:,,,, ultrasound used (permanent image in chart),,    Narrative:  Start time: 03/10/2023 2:57 PM End time: 03/10/2023 3:02 PM Injection made incrementally with aspirations every 5 mL.  Performed by: Personally  Anesthesiologist: Roderic Palau, MD  Additional Notes:

## 2023-03-10 NOTE — Anesthesia Procedure Notes (Signed)
Anesthesia Regional Block: Popliteal block   Pre-Anesthetic Checklist: , timeout performed,  Correct Patient, Correct Site, Correct Laterality,  Correct Procedure, Correct Position, site marked,  Risks and benefits discussed,  Pre-op evaluation,  At surgeon's request and post-op pain management  Laterality: Left  Prep: Maximum Sterile Barrier Precautions used, chloraprep       Needles:  Injection technique: Single-shot  Needle Type: Echogenic Stimulator Needle     Needle Length: 9cm  Needle Gauge: 21     Additional Needles:   Procedures:,,,, ultrasound used (permanent image in chart),,    Narrative:  Start time: 03/10/2023 2:47 PM End time: 03/10/2023 2:57 PM Injection made incrementally with aspirations every 5 mL.  Performed by: Personally  Anesthesiologist: Roderic Palau, MD

## 2023-03-10 NOTE — Anesthesia Postprocedure Evaluation (Signed)
Anesthesia Post Note  Patient: Jennifer Lynn  Procedure(s) Performed: LEFT FOOT OPEN TREATMENT OF LISFRANC JOINT, OPEN TREATMENT OF CUNEIFORM FRACTURES, OPEN TREATMENT OF FIRST TARSOMETATARSAL JOINT (Left: Foot) OPEN TREATMENT OF METATARSAL FRACTURES (Left)     Patient location during evaluation: PACU Anesthesia Type: General and Regional Level of consciousness: awake and alert Pain management: pain level controlled Vital Signs Assessment: post-procedure vital signs reviewed and stable Respiratory status: spontaneous breathing, nonlabored ventilation and respiratory function stable Cardiovascular status: blood pressure returned to baseline and stable Postop Assessment: no apparent nausea or vomiting Anesthetic complications: no  No notable events documented.  Last Vitals:  Vitals:   03/10/23 1700 03/10/23 1715  BP: 135/78 115/72  Pulse: 74 (!) 52  Resp: 19 16  Temp:  36.7 C  SpO2: 97% 99%    Last Pain:  Vitals:   03/10/23 1715  TempSrc:   PainSc: 0-No pain                 Jasani Dolney,W. EDMOND

## 2023-03-10 NOTE — Anesthesia Preprocedure Evaluation (Addendum)
Anesthesia Evaluation  Patient identified by MRN, date of birth, ID band Patient awake    Reviewed: Allergy & Precautions, H&P , NPO status , Patient's Chart, lab work & pertinent test results  Airway Mallampati: II  TM Distance: >3 FB Neck ROM: Full    Dental no notable dental hx. (+) Teeth Intact, Dental Advisory Given   Pulmonary neg pulmonary ROS   Pulmonary exam normal breath sounds clear to auscultation       Cardiovascular negative cardio ROS  Rhythm:Regular Rate:Normal     Neuro/Psych  Headaches  Anxiety        GI/Hepatic Neg liver ROS,GERD  Controlled,,  Endo/Other    Morbid obesity  Renal/GU negative Renal ROS  negative genitourinary   Musculoskeletal   Abdominal   Peds  Hematology  (+) Blood dyscrasia, anemia   Anesthesia Other Findings   Reproductive/Obstetrics negative OB ROS                             Anesthesia Physical Anesthesia Plan  ASA: 3  Anesthesia Plan: General   Post-op Pain Management: Tylenol PO (pre-op)*, Toradol IV (intra-op)* and Regional block*   Induction: Intravenous  PONV Risk Score and Plan: 4 or greater and Ondansetron, Dexamethasone and Midazolam  Airway Management Planned: Oral ETT  Additional Equipment:   Intra-op Plan:   Post-operative Plan: Extubation in OR  Informed Consent: I have reviewed the patients History and Physical, chart, labs and discussed the procedure including the risks, benefits and alternatives for the proposed anesthesia with the patient or authorized representative who has indicated his/her understanding and acceptance.     Dental advisory given  Plan Discussed with: CRNA  Anesthesia Plan Comments:        Anesthesia Quick Evaluation

## 2023-03-10 NOTE — Anesthesia Procedure Notes (Signed)
Procedure Name: Intubation Date/Time: 03/10/2023 3:52 PM  Performed by: Thelma Comp, CRNAPre-anesthesia Checklist: Patient identified, Emergency Drugs available, Suction available and Patient being monitored Patient Re-evaluated:Patient Re-evaluated prior to induction Oxygen Delivery Method: Circle System Utilized Preoxygenation: Pre-oxygenation with 100% oxygen Induction Type: IV induction Ventilation: Mask ventilation without difficulty Laryngoscope Size: Mac and 3 Grade View: Grade I Tube type: Oral Tube size: 7.0 mm Number of attempts: 1 Airway Equipment and Method: Stylet Placement Confirmation: ETT inserted through vocal cords under direct vision, positive ETCO2 and breath sounds checked- equal and bilateral Secured at: 22 cm Tube secured with: Tape Dental Injury: Teeth and Oropharynx as per pre-operative assessment

## 2023-03-10 NOTE — Transfer of Care (Signed)
Immediate Anesthesia Transfer of Care Note  Patient: Jennifer Lynn  Procedure(s) Performed: LEFT FOOT OPEN TREATMENT OF LISFRANC JOINT, OPEN TREATMENT OF CUNEIFORM FRACTURES, OPEN TREATMENT OF FIRST TARSOMETATARSAL JOINT (Left: Foot) OPEN TREATMENT OF METATARSAL FRACTURES (Left)  Patient Location: PACU  Anesthesia Type:GA combined with regional for post-op pain  Level of Consciousness: drowsy and patient cooperative  Airway & Oxygen Therapy: Patient Spontanous Breathing  Post-op Assessment: Report given to RN and Post -op Vital signs reviewed and stable  Post vital signs: Reviewed and stable  Last Vitals:  Vitals Value Taken Time  BP 131/88 03/10/23 1650  Temp    Pulse 81 03/10/23 1654  Resp 13 03/10/23 1654  SpO2 95 % 03/10/23 1654  Vitals shown include unvalidated device data.  Last Pain:  Vitals:   03/10/23 1336  TempSrc: Oral  PainSc: 0-No pain         Complications: No notable events documented.

## 2023-03-15 ENCOUNTER — Encounter (HOSPITAL_COMMUNITY): Payer: Self-pay | Admitting: Orthopaedic Surgery

## 2023-03-17 ENCOUNTER — Other Ambulatory Visit (HOSPITAL_COMMUNITY)
Admission: RE | Admit: 2023-03-17 | Discharge: 2023-03-17 | Disposition: A | Discharge status: Home / Self Care | Payer: Medicaid Other | Source: Ambulatory Visit | Attending: Family | Admitting: Family

## 2023-03-17 ENCOUNTER — Ambulatory Visit: Payer: Medicaid Other | Admitting: Family

## 2023-03-17 VITALS — BP 119/86 | HR 74 | Temp 97.4°F | Ht 64.0 in | Wt 224.0 lb

## 2023-03-17 DIAGNOSIS — B3731 Acute candidiasis of vulva and vagina: Secondary | ICD-10-CM | POA: Insufficient documentation

## 2023-03-17 MED ORDER — FLUCONAZOLE 150 MG PO TABS: ORAL_TABLET | ORAL | 0 refills | Status: DC

## 2023-03-17 NOTE — Progress Notes (Signed)
Patient ID: Jennifer Lynn, female    DOB: 1996/02/20, 27 y.o.   MRN: [id number]  Chief Complaint  Patient presents with   Vaginal Itching    Pt c/o vaginal itching and burning, Present since a day.     HPI:      Vaginal sx:  vaginal itching and burning started yesterday. She states it feels raw in her vagina. Denies frequency, discharge, urgency, hematuria, pelvic/back pain, fever. Pt was given antibiotics for recent foot surgery and was told it could cause yeast infection.    Assessment & Plan:  1. Vaginal yeast infection - sending Diflucan, advised on use & SE. Advised on drinking 2L of water qd.  - Urine cytology ancillary only - fluconazole (DIFLUCAN) 150 MG tablet; Take 1 pill today and the 2nd pill in 3 days.  Dispense: 2 tablet; Refill: 0   Subjective:    Outpatient Medications Prior to Visit  Medication Sig Dispense Refill   aspirin (BAYER ASPIRIN) 325 MG tablet Take 1 tablet by mouth for 30 DAYS for blood clot prevention 30 tablet 0   oxyCODONE (ROXICODONE) 5 MG immediate release tablet Take 1 tablet (5 mg total) by mouth every 4 (four) hours as needed for severe pain. (Patient not taking: Reported on 03/17/2023) 30 tablet 0   No facility-administered medications prior to visit.   Past Medical History:  Diagnosis Date   Anemia    Anxiety    Blood transfusion without reported diagnosis    patient thinks she had after previous delivery   GERD (gastroesophageal reflux disease)    UTI (urinary tract infection)    Past Surgical History:  Procedure Laterality Date   CESAREAN SECTION N/A 09/16/2017   Procedure: CESAREAN SECTION;  Surgeon: Aletha Halim, MD;  Location: [facility];  Service: Obstetrics;  Laterality: N/A;   CESAREAN SECTION N/A 10/31/2020   Procedure: CESAREAN SECTION;  Surgeon: Woodroe Mode, MD;  Location: [facility];  Service: Obstetrics;  Laterality: N/A;   OPEN REDUCTION INTERNAL FIXATION (ORIF) FOOT LISFRANC FRACTURE Left 03/10/2023    Procedure: LEFT FOOT OPEN TREATMENT OF LISFRANC JOINT, OPEN TREATMENT OF CUNEIFORM FRACTURES, OPEN TREATMENT OF FIRST TARSOMETATARSAL JOINT;  Surgeon: Erle Crocker, MD;  Location: [facility];  Service: Orthopedics;  Laterality: Left;   REPAIR EXTENSOR TENDON WITH METATARSAL OSTEOTOMY AND OPEN REDUCTION IN Left 03/10/2023   Procedure: OPEN TREATMENT OF METATARSAL FRACTURES;  Surgeon: Erle Crocker, MD;  Location: [facility];  Service: Orthopedics;  Laterality: Left;   WISDOM TOOTH EXTRACTION     No Known Allergies    Objective:    Physical Exam Vitals and nursing note reviewed.  Constitutional:      Appearance: Normal appearance.  Cardiovascular:     Rate and Rhythm: Normal rate and regular rhythm.  Pulmonary:     Effort: Pulmonary effort is normal.     Breath sounds: Normal breath sounds.  Musculoskeletal:        General: Normal range of motion.     Comments: Left foot/leg in a cast, pt s/p surgery for fx repair  Skin:    General: Skin is warm and dry.  Neurological:     Mental Status: She is alert.  Psychiatric:        Mood and Affect: Mood normal.        Behavior: Behavior normal.    BP 119/86 (BP Location: Left Arm, Patient Position: Sitting, Cuff Size: Large)   Pulse 74   Temp (!) 97.4 F (  36.3 C) (Temporal)   Ht 5\' 4"  (1.626 m)   Wt 224 lb (101.6 kg)   LMP 02/16/2023 (Approximate)   SpO2 97%   BMI 38.45 kg/m  Wt Readings from Last 3 Encounters:  03/17/23 224 lb (101.6 kg)  03/10/23 223 lb 9.6 oz (101.4 kg)  02/25/23 230 lb (104.3 kg)      Jeanie Sewer, NP

## 2023-03-18 LAB — URINE CYTOLOGY ANCILLARY ONLY
Bacterial Vaginitis-Urine: NEGATIVE
Candida Urine: NEGATIVE — AB
Candida Urine: POSITIVE — AB
Chlamydia: NEGATIVE
Comment: NEGATIVE
Comment: NEGATIVE
Comment: NORMAL
Neisseria Gonorrhea: NEGATIVE
Trichomonas: NEGATIVE

## 2023-03-25 DIAGNOSIS — M79672 Pain in left foot: Secondary | ICD-10-CM | POA: Diagnosis not present

## 2023-03-30 NOTE — Op Note (Signed)
Jennifer Lynn female 27 y.o. 03/10/2023  PreOperative Diagnosis: Left midfoot fracture dislocation  PostOperative Diagnosis: Left foot Lisfranc disruption Left foot cuneiform fracture, medial Left foot cuneiform fracture, middle First metatarsal base fracture Second metatarsal base fracture Third metatarsal base fracture Fourth metatarsal base fracture First tarsometatarsal joint disruption Second tarsometatarsal joint disruption  PROCEDURE: Open reduction internal fixation of second metatarsal base fracture Open reduction internal fixation of middle cuneiform fracture Open reduction internal fixation of medial cuneiform fracture Open reduction internal fixation of the second tarsal metatarsal joint Open reduction internal fixation of Lisfranc joint Open reduction first metatarsal base fracture Open reduction internal fixation of first tarsal metatarsal joint Open reduction of third metatarsal base fracture Open reduction of fourth metatarsal base fracture   SURGEON: Dub Mikes, MD  ASSISTANT: [name], PA-C was necessary for patient positioning, prep, drape, assistance with fracture reduction and placement of hardware.  ANESTHESIA: General with peripheral nerve block  FINDINGS: See below  IMPLANTS: Arthrex midfoot set  INDICATIONS:26 y.o. femalesustained the above injury after a fall.  She had disruption of her Lisfranc joint and instability and fracturing along her midfoot as well.  Given the instability pattern and findings on x-ray and advanced imaging she was indicated for surgery.   Patient understood the risks, benefits and alternatives to surgery which include but are not limited to wound healing complications, infection, nonunion, malunion, need for further surgery as well as damage to surrounding structures. They also understood the potential for continued pain in that there were no guarantees of acceptable outcome After weighing these risks the  patient opted to proceed with surgery.  PROCEDURE: Patient was identified in the preoperative holding area.  The left foot was marked by myself.  Consent was signed by myself and the patient.  Block was performed by anesthesia in the preoperative holding area.  Patient was taken to the operative suite and placed supine on the operative table.  General anesthesia was induced without difficulty. Bump was placed under the operative hip and bone foam was used.  All bony prominences were well padded.  Tourniquet was placed on the operative thigh.  Preoperative antibiotics were given. The extremity was prepped and draped in the usual sterile fashion and surgical timeout was performed.  The limb was elevated and the tourniquet was inflated to 250 mmHg.  We began by making a longitudinal incision overlying the second tarsometatarsal joint.  This was taken sharply down through skin and subcutaneous tissue.  Blunt dissection was used to mobilize the skin medially and laterally.  There were a few branches of the superior peroneal nerve in the field which were protected through the entirety of the case.  Then the fascia overlying the extensor hallucis brevis muscle belly was incised in line with the incision.  The muscle belly was mobilized.  The tendon sheath was incised distally along the line of the tendon to allow for retraction.  The muscle was retracted and held with [name].  Then the neurovascular bundle was identified.  An incision was made medially and laterally to the neurovascular bundle and it was elevated in a subperiosteal fashion.  A vessel loop was placed around it to protected through the entirety of the case which was done.  Then the second tarsometatarsal joint was identified.  There was frank instability of the Lisfranc joint between the second metatarsal base and the medial cuneiform.  There was hematoma and consolidated fibrous tissue within this area.  The intercuneiform joint was inspected.  There was gross instability of the second tarsometatarsal joint.  A [name] was placed within the intercuneiform joint and the Lisfranc joint there was gross instability.  Using a rongeur the fibrous tissue within the Lisfranc joint was cleared.  There was fracturing of the second metatarsal base with displacement of the fracture fragments as well as fracturing of the middle and medial cuneiforms.  Open treatment of the second metatarsal base fracture: We turned our attention to the fracture of the second metatarsal base.  Freer elevator was placed within the Lisfranc space and the fracture fragment was fully mobilized. fracturing was mobilized with the freer and with a rongeur.  Interposed hematoma and fibrous tissue was removed.  The fracture fragment was fully mobilized allowing for reduction. Then using a [name] it was reduced into a better position plantarly.  This was held in place with a [name].  Then using a rongeur small portion of comminuted bone was removed from the space.   Open reduction of middle cuneiform fracture: We then proceeded with reduction of the middle cuneiform.  The fracture fragments along the distal aspect of the cuneiform bone was mobilized fully with the [name] and a Ronguer was used to remove interposed hematoma and fibrous tissue.  Then the fracture fragments were able to be reduced under direct visualization held provisionally.there was some articular surface exposed that was adequately reduced.  Open reduction of medial cuneiform fracture: We then proceeded with reduction of the medial cuneiform.  The fracture fragments were within the interspace between the second metatarsal base.  The fracture fragments were mobilized fully with the Freer and interposed tissue was removed with a rongeur.  [name] was used to acceptably mobilize the fracture fragments and were able to reduce them under direct visualization and hold them provisionally.  Open reduction internal fixation  of second tarsometatarsal joint: We then proceeded to inspect the second tarsometatarsal joint.  There was continued dorsal subluxation of the second metatarsal base in relationship to the middle cuneiform.  Using a freer elevator the second metatarsal base was reduced into a better position under direct visualization.  then a K wire was placed from the base of the second metatarsal across the second tarsometatarsal joint stabilizing the joint provisionally. Then with this joint held reduced a pointed reduction forceps was used to address the Lisfranc joint.  Open reduction internal fixation of the Lisfranc joint: Using a pointed reduction forceps from the base of the second metatarsal to the medial cuneiform we are able to reduce the Lisfranc joint in a near anatomic position.  Fluoroscopy confirmed acceptable reduction of the intercuneiform joints, second tarsometatarsal joint and Lisfranc joint.  Then a K wire was placed from the second metatarsal base across the Lisfranc joint into the medial cuneiform.  Then appropriate position was confirmed fluoroscopically.  Then we proceeded to drill across the K wire in place a solid fully threaded screw across the Lisfranc joint.  After this the K wires and clamp were removed and there was maintenance of stability and reduction of the intercuneiform joint, second tarsometatarsal joint and Lisfranc joint.  We then inspected the first tarsometatarsal joint.  Open reduction of first metatarsal base fracture: A separate incision was made along the medial foot just overlying the medial aspect of the first tarsometatarsal joint.  The incision was carried sharply down through skin and subcutaneous tissue.  Blunt dissection was used to mobilize skin flaps and the base of the first metatarsal was identified.  There is fracturing of  the base of the first metatarsal and of the plantar aspect of the middle cuneiform.  Using a Therapist, nutritional we are able to mobilize the fracture  fragment.  The fracture fragment was quite small.  Using a ronguer fibrous tissue was removed from the base of the first metatarsal and from the fracture fragment.  It was then identified that the fracture fragment was too small for internal fixation. The joint was inspected.  There is some impaction of the plantar aspect of the medial cuneiform and some subluxation of the first tarsometatarsal joint.  We turned our attention back to the dorsal foot.  Open reduction internal fixation of first tarsal metatarsal joint: Separate deep incision was created to gain access to the first tarsal metatarsal joint dorsally.  There was gross instability of the joint.  It was subluxated and was in a valgus position.  Under direct visualization we are able to reduce the first tarsometatarsal joint into a near-anatomic position and then a K wire was placed across the joint from the medial aspect of the first metatarsal base into the cuneiform bone to stabilize the joint.  The K wire across the first tarsometatarsal joint and intercuneiform joint was bent down to the skin.   Open reduction of the third metatarsal base fracture. We then proceeded with separate deep incision to gain access to the base of the third metatarsal on the lateral cuneiform.  The base of the third metatarsal was identified and were able to identify the fracture fragments of the third metatarsal base and the lateral cuneiform.  Using a Therapist, nutritional were able to mobilize the fracture fragments.  They were mostly along the base of the third metatarsal and the lateral cuneiform.  There is some fracturing in this area.  The fracture fragment were mobilized and interposed tissue was removed with a ronguer.  In the fracture fragments were reduced under direct visualization and held provisionally.  Open reduction of fourth metatarsal base fracture We then proceeded with open reduction of the fourth metatarsal base fracture.  There is mild displacement of  the fracture fragment.  Freer elevator was used to bluntly dissected soft tissue along the lateral aspect of the foot to gain access to the fourth metatarsal.  Fluoroscopy was used to confirm appropriate reduction of the fourth metatarsal fracture after manipulation and exposure of the fracture site. Then remaining K wires were removed instability about the midfoot was checked.  All remaining joints and fractures remained adequately reduced.  A final fluoroscopic images were obtained.     The wound was irrigated with normal saline.  The neurovascular bundle was inspected and found to be without injury.  Then the retinacular tissue was closed using 3-0 Monocryl suture.  Skin and subcutaneous tissue was closed using 3-0 Monocryl and 3-0 nylon suture.  Tourniquet was released.  She was placed in a short leg nonweightbearing splint.  Patient was then awakened from anesthesia and taken to recovery in stable condition.  No complications.  Counts were correct.    POST OPERATIVE INSTRUCTIONS: On weightbearing to operative extremity Keep splint dry Follow-up in 2 weeks for suture removal, nonweightbearing x-rays and placement of a cast  TOURNIQUET TIME:Less than 2 hours  BLOOD LOSS:  Minimal         DRAINS: none         SPECIMEN: none       COMPLICATIONS:  * No complications entered in OR log *         Disposition:  PACU - hemodynamically stable.         Condition: stable

## 2023-04-08 ENCOUNTER — Encounter: Payer: Self-pay | Admitting: Medical

## 2023-04-08 ENCOUNTER — Encounter: Payer: Self-pay | Admitting: Family

## 2023-04-08 ENCOUNTER — Other Ambulatory Visit (HOSPITAL_COMMUNITY)
Admission: RE | Admit: 2023-04-08 | Discharge: 2023-04-08 | Disposition: A | Discharge status: Home / Self Care | Payer: Medicaid Other | Source: Ambulatory Visit | Attending: Medical | Admitting: Medical

## 2023-04-08 ENCOUNTER — Ambulatory Visit: Payer: Medicaid Other | Admitting: Medical

## 2023-04-08 ENCOUNTER — Other Ambulatory Visit: Payer: Self-pay

## 2023-04-08 ENCOUNTER — Ambulatory Visit (INDEPENDENT_AMBULATORY_CARE_PROVIDER_SITE_OTHER): Payer: Medicaid Other | Admitting: Family

## 2023-04-08 VITALS — BP 121/86 | HR 74 | Temp 97.3°F | Ht 64.0 in

## 2023-04-08 VITALS — BP 128/80 | HR 91 | Ht 64.0 in | Wt 230.5 lb

## 2023-04-08 DIAGNOSIS — Z01419 Encounter for gynecological examination (general) (routine) without abnormal findings: Secondary | ICD-10-CM

## 2023-04-08 DIAGNOSIS — Z124 Encounter for screening for malignant neoplasm of cervix: Secondary | ICD-10-CM | POA: Diagnosis not present

## 2023-04-08 DIAGNOSIS — Z0000 Encounter for general adult medical examination without abnormal findings: Secondary | ICD-10-CM

## 2023-04-08 DIAGNOSIS — Z113 Encounter for screening for infections with a predominantly sexual mode of transmission: Secondary | ICD-10-CM | POA: Diagnosis not present

## 2023-04-08 LAB — COMPREHENSIVE METABOLIC PANEL
ALT: 12 U/L (ref 0–35)
AST: 13 U/L (ref 0–37)
Albumin: 4.4 g/dL (ref 3.5–5.2)
Alkaline Phosphatase: 69 U/L (ref 39–117)
BUN: 10 mg/dL (ref 6–23)
CO2: 25 mEq/L (ref 19–32)
Calcium: 9.3 mg/dL (ref 8.4–10.5)
Chloride: 104 mEq/L (ref 96–112)
Creatinine, Ser: 0.81 mg/dL (ref 0.40–1.20)
GFR: 98.71 mL/min (ref >60.00)
Glucose, Bld: 84 mg/dL (ref 70–99)
Potassium: 4.3 mEq/L (ref 3.5–5.1)
Sodium: 137 mEq/L (ref 135–145)
Total Bilirubin: 0.3 mg/dL (ref 0.2–1.2)
Total Protein: 7.3 g/dL (ref 6.0–8.3)

## 2023-04-08 LAB — LIPID PANEL
Cholesterol: 124 mg/dL (ref 0–200)
HDL: 42 mg/dL (ref >39.00)
LDL Cholesterol: 73 mg/dL (ref 0–99)
NonHDL: 81.95
Total CHOL/HDL Ratio: 3
Triglycerides: 46 mg/dL (ref 0.0–149.0)
VLDL: 9.2 mg/dL (ref 0.0–40.0)

## 2023-04-08 LAB — CBC WITH DIFFERENTIAL/PLATELET
Basophils Absolute: 0.1 10*3/uL (ref 0.0–0.1)
Basophils Relative: 0.9 % (ref 0.0–3.0)
Eosinophils Absolute: 0.1 10*3/uL (ref 0.0–0.7)
Eosinophils Relative: 1.3 % (ref 0.0–5.0)
HCT: 40.4 % (ref 36.0–46.0)
Hemoglobin: 12.8 g/dL (ref 12.0–15.0)
Lymphocytes Relative: 32 % (ref 12.0–46.0)
Lymphs Abs: 1.8 10*3/uL (ref 0.7–4.0)
MCHC: 31.8 g/dL (ref 30.0–36.0)
MCV: 83.8 fl (ref 78.0–100.0)
Monocytes Absolute: 0.5 10*3/uL (ref 0.1–1.0)
Monocytes Relative: 7.8 % (ref 3.0–12.0)
Neutro Abs: 3.3 10*3/uL (ref 1.4–7.7)
Neutrophils Relative %: 58 % (ref 43.0–77.0)
Platelets: 273 10*3/uL (ref 150.0–400.0)
RBC: 4.82 Mil/uL (ref 3.87–5.11)
RDW: 14.1 % (ref 11.5–15.5)
WBC: 5.8 10*3/uL (ref 4.0–10.5)

## 2023-04-08 LAB — TSH: TSH: 0.88 u[IU]/mL (ref 0.35–5.50)

## 2023-04-08 NOTE — Patient Instructions (Addendum)
It was very nice to see you today!   I will review your lab results via MyChart in a few days.   Have a great weekend!    PLEASE NOTE:  If you had any lab tests please let us know if you have not heard back within a few days. You may see your results on MyChart before we have a chance to review them but we will give you a call once they are reviewed by us. If we ordered any referrals today, please let us know if you have not heard from their office within the next week.    

## 2023-04-08 NOTE — Progress Notes (Signed)
Phone [phone]  Subjective:   Patient is a 27 y.o. female presenting for annual physical.    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   Anemia 06/03/2021   GERD (gastroesophageal reflux disease) 06/03/2021   History of pre-eclampsia 10/17/2017   Alpha thalassemia trait 03/17/2017   Past Surgical History:  Procedure Laterality Date   CESAREAN SECTION N/A 09/16/2017   Procedure: CESAREAN SECTION;  Surgeon:  Bing, MD;  Location: [facility] BIRTHING SUITES;  Service: Obstetrics;  Laterality: N/A;   CESAREAN SECTION N/A 10/31/2020   Procedure: CESAREAN SECTION;  Surgeon: Adam Phenix, MD;  Location: [facility] LD ORS;  Service: Obstetrics;  Laterality: N/A;   OPEN REDUCTION INTERNAL FIXATION (ORIF) FOOT LISFRANC FRACTURE Left 03/10/2023   Procedure: LEFT FOOT OPEN TREATMENT OF LISFRANC JOINT, OPEN TREATMENT OF CUNEIFORM FRACTURES, OPEN TREATMENT OF FIRST TARSOMETATARSAL JOINT;  Surgeon: Terance Hart, MD;  Location: [facility] OR;  Service: Orthopedics;  Laterality: Left;   REPAIR EXTENSOR TENDON WITH METATARSAL OSTEOTOMY AND OPEN REDUCTION IN Left 03/10/2023   Procedure: OPEN TREATMENT OF METATARSAL FRACTURES;  Surgeon: Terance Hart, MD;  Location: [facility] OR;  Service: Orthopedics;  Laterality: Left;   WISDOM TOOTH EXTRACTION      Family History  Problem Relation Age of Onset    Hypertension Mother    Hypertension Father    Diabetes Maternal Grandmother    Diabetes Paternal Grandmother     Medications- reviewed and updated Current Outpatient Medications  Medication Sig Dispense Refill   fluconazole (DIFLUCAN) 150 MG tablet Take 1 pill today and the 2nd pill in 3 days. (Patient not taking: Reported on 04/08/2023) 2 tablet 0   oxyCODONE (ROXICODONE) 5 MG immediate release tablet Take 1 tablet (5 mg total) by mouth every 4 (four) hours as needed for severe pain. (Patient not taking: Reported on 04/08/2023) 30 tablet 0   No current facility-administered medications for this visit.    Allergies-reviewed and updated No Known Allergies  Social History   Social History Narrative   Not on file    Objective:  BP 121/86 (BP Location: Left Arm, Patient Position: Sitting, Cuff Size: Large)   Pulse 74   Temp (!) 97.3 F (36.3 C) (Temporal)   Ht 5\' 4"  (1.626 m)   LMP 03/16/2023 (Exact Date)   SpO2 98%   BMI 38.45 kg/m  Physical Exam Vitals and nursing note reviewed.  Constitutional:      Appearance: Normal appearance.  HENT:     Head: Normocephalic.     Right Ear: Tympanic membrane normal.     Left Ear: Tympanic membrane normal.     Nose: Nose normal.     Mouth/Throat:     Mouth: Mucous membranes are moist.  Eyes:     Pupils: Pupils are equal, round, and reactive to light.  Cardiovascular:     Rate  and Rhythm: Normal rate and regular rhythm.  Pulmonary:     Effort: Pulmonary effort is normal.     Breath sounds: Normal breath sounds.  Musculoskeletal:        General: Normal range of motion.     Cervical back: Normal range of motion.  Lymphadenopathy:     Cervical: No cervical adenopathy.  Skin:    General: Skin is warm and dry.  Neurological:     Mental Status: She is alert.  Psychiatric:        Mood and Affect: Mood normal.        Behavior: Behavior normal.      Assessment and Plan   Health Maintenance counseling: 1. Anticipatory  guidance: Patient counseled regarding regular dental exams q6 months, eye exams,  avoiding smoking and second hand smoke, limiting alcohol to 1 beverage per day, no illicit drugs.   2. Risk factor reduction:  Advised patient of need for regular exercise and diet rich with fruits and vegetables to reduce risk of heart attack and stroke. Wt Readings from Last 3 Encounters:  03/17/23 224 lb (101.6 kg)  03/10/23 223 lb 9.6 oz (101.4 kg)  02/25/23 230 lb (104.3 kg)   3. Immunizations/screenings/ancillary studies Immunization History  Administered Date(s) Administered   HPV 9-valent 06/26/2021   Influenza,inj,Quad PF,6+ Mos 02/17/2017   Tdap 06/26/2021   Health Maintenance Due  Topic Date Due   COVID-19 Vaccine (1) Never done   HPV VACCINES (2 - 3-dose series) 07/24/2021   PAP-Cervical Cytology Screening  04/26/2023   PAP SMEAR-Modifier  04/26/2023    4. Cervical cancer screening: doing PAP today at GYN 5. Skin cancer screening- advised regular sunscreen use. Denies worrisome, changing, or new skin lesions.  6. Birth control/STD check: N/A 7. Smoking associated screening: non- smoker 8. Alcohol screening: rare  Annual physical exam -     Comprehensive metabolic panel -     TSH -     Lipid panel -     CBC with Differential/Platelet   Recommended follow up:  No follow-ups on file. Future Appointments  Date Time Provider Department Center  04/08/2023  9:35 AM Kathlene Cote [facility]    Lab/Order associations: fasting    Dulce Sellar, NP

## 2023-04-08 NOTE — Progress Notes (Signed)
History:  [name] is a 27 y.o. [id number] who presents to clinic today for annual exam. Last pap smear was 04/25/2020 and normal. Patient is currently sexually active and using condoms for birth control. Periods are regular lasting 3-5 days. She denies vaginal bleeding, discharge, UTI symptoms, GI issues or breast concerns today. She has no GYN concerns.   The following portions of the patient's history were reviewed and updated as appropriate: allergies, current medications, family history, past medical history, social history, past surgical history and problem list.  Review of Systems:  Review of Systems  Constitutional:  Negative for fever and malaise/fatigue.  Gastrointestinal:  Negative for abdominal pain, constipation, diarrhea, nausea and vomiting.  Genitourinary:  Negative for dysuria, frequency and urgency.       Neg - vaginal bleeding, discharge, pelvic pain      Objective:  Physical Exam BP 128/80   Pulse 91   Ht 5\' 4"  (1.626 m)   Wt 230 lb 8 oz (104.6 kg)   LMP 03/19/2023   BMI 39.57 kg/m  Physical Exam Vitals and nursing note reviewed. Exam conducted with a chaperone present.  Constitutional:      General: She is not in acute distress.    Appearance: Normal appearance. She is well-developed.  HENT:     Head: Normocephalic and atraumatic.  Neck:     Thyroid: No thyromegaly.  Cardiovascular:     Rate and Rhythm: Normal rate and regular rhythm.     Heart sounds: No murmur heard. Pulmonary:     Effort: Pulmonary effort is normal. No respiratory distress.     Breath sounds: Normal breath sounds. No wheezing.  Abdominal:     General: Abdomen is flat. Bowel sounds are normal. There is no distension.     Palpations: Abdomen is soft. There is no mass.     Tenderness: There is no abdominal tenderness. There is no guarding or rebound.  Genitourinary:    General: Normal vulva.     Vagina: Vaginal discharge (small, white) present. No erythema, tenderness or  bleeding.     Cervix: No cervical motion tenderness, discharge, friability, lesion, erythema or cervical bleeding.     Uterus: Not enlarged and not tender.      Adnexa:        Right: No mass or tenderness.         Left: No mass or tenderness.    Musculoskeletal:     Cervical back: Neck supple.  Skin:    General: Skin is warm and dry.     Findings: No erythema.  Neurological:     Mental Status: She is alert and oriented to person, place, and time.  Psychiatric:        Mood and Affect: Mood normal.     Health Maintenance Due  Topic Date Due   COVID-19 Vaccine (1) Never done   HPV VACCINES (2 - 3-dose series) 07/24/2021   PAP-Cervical Cytology Screening  04/26/2023   PAP SMEAR-Modifier  04/26/2023    Labs, imaging and previous visits in Epic and Care Everywhere reviewed  Assessment & Plan:  1. Encounter for annual routine gynecological examination  2. Cervical cancer screening - pap today   3. Screening examination for STD (sexually transmitted disease) - GC/CT on pap  - HIV, RPR, Hep B and Hep C  Return in about 1 year (around 04/07/2024) for Annual exam. If pap is normal, next routine screening in 3 years  Kathlene Cote 04/08/2023 9:59 AM

## 2023-04-09 LAB — RPR+HBSAG+HCVAB+...
HIV Screen 4th Generation wRfx: NONREACTIVE
Hep C Virus Ab: NONREACTIVE
Hepatitis B Surface Ag: NEGATIVE
RPR Ser Ql: NONREACTIVE

## 2023-04-12 LAB — CYTOLOGY - PAP
Chlamydia: NEGATIVE
Comment: NEGATIVE
Comment: NORMAL
Diagnosis: NEGATIVE
Neisseria Gonorrhea: NEGATIVE

## 2023-04-14 ENCOUNTER — Encounter: Payer: Self-pay | Admitting: Family

## 2023-04-14 ENCOUNTER — Ambulatory Visit: Payer: Medicaid Other | Admitting: Family

## 2023-04-14 VITALS — BP 121/81 | HR 72 | Temp 97.7°F | Ht 64.0 in | Wt 230.0 lb

## 2023-04-14 DIAGNOSIS — R635 Abnormal weight gain: Secondary | ICD-10-CM | POA: Diagnosis not present

## 2023-04-14 MED ORDER — PHENTERMINE HCL 37.5 MG PO TABS: 18.7500 mg | ORAL_TABLET | Freq: Every day | ORAL | 0 refills | Status: DC

## 2023-04-14 NOTE — Progress Notes (Signed)
Patient ID: Jennifer Lynn, female    DOB: 02/09/1996, 27 y.o.   MRN: [id number]  Chief Complaint  Patient presents with   Weight Loss    Discuss medications     HPI: Weight gain:  pt has gone to [facility] and told she would need labs redrawn, her visits are expensive and then paying for medication as Medicaid will not cover her visits or med. pt denies having been on any meds in past. Currently has a broken foot in a cast, having to use a scooter to get around, not able to exercise. Would like to try Phentermine for the short term.   Assessment & Plan:  Abnormal weight gain Assessment & Plan: chronic broken foot, non-wt bearing currently, unable to exercise, feeling a little depressed as she is worried she will gain weight pt insurance will not cover GLP-1s sending low dose phentermine, 1/2 pill of 37.5mg , advised on use & SE advised on wt loss strategies including portion control, less carbs including sweets, eating most of calories earlier in day, drinking 64oz water qd, and establishing daily exercise routine.  f/u 1 month  Orders: -     Phentermine HCl; Take 0.5 tablets (18.75 mg total) by mouth daily before breakfast. Take 1/2 pill for 2 weeks, then go to 1 full pill.  Dispense: 30 tablet; Refill: 0   Subjective:    No outpatient medications prior to visit.   No facility-administered medications prior to visit.   Past Medical History:  Diagnosis Date   Anemia    Anxiety    Blood transfusion without reported diagnosis    patient thinks she had after previous delivery   GERD (gastroesophageal reflux disease)    History of pre-eclampsia 10/17/2017   [x]  Aspirin 81 mg daily after 12 weeks  Current antihypertensives:  None      Baseline and surveillance labs (pulled in from [facility], refresh links as needed)       Lab Results  Component  Value  Date     PLT  288  04/25/2020     CREATININE  0.69  04/25/2020     AST  17  04/25/2020     ALT  18  04/25/2020     PROTCRRATIO   1.71 (H)  09/15/2017     LABPROT  14.9  02/20/2017        Antenatal Testing  CH   UTI (urinary tract infection)    Past Surgical History:  Procedure Laterality Date   CESAREAN SECTION N/A 09/16/2017   Procedure: CESAREAN SECTION;  Surgeon: Olivia Lopez de Gutierrez Bing, MD;  Location: [facility] BIRTHING SUITES;  Service: Obstetrics;  Laterality: N/A;   CESAREAN SECTION N/A 10/31/2020   Procedure: CESAREAN SECTION;  Surgeon: Adam Phenix, MD;  Location: [facility] LD ORS;  Service: Obstetrics;  Laterality: N/A;   OPEN REDUCTION INTERNAL FIXATION (ORIF) FOOT LISFRANC FRACTURE Left 03/10/2023   Procedure: LEFT FOOT OPEN TREATMENT OF LISFRANC JOINT, OPEN TREATMENT OF CUNEIFORM FRACTURES, OPEN TREATMENT OF FIRST TARSOMETATARSAL JOINT;  Surgeon: Terance Hart, MD;  Location: [facility] OR;  Service: Orthopedics;  Laterality: Left;   REPAIR EXTENSOR TENDON WITH METATARSAL OSTEOTOMY AND OPEN REDUCTION IN Left 03/10/2023   Procedure: OPEN TREATMENT OF METATARSAL FRACTURES;  Surgeon: Terance Hart, MD;  Location: [facility] OR;  Service: Orthopedics;  Laterality: Left;   WISDOM TOOTH EXTRACTION     No Known Allergies    Objective:    Physical Exam Vitals and nursing note reviewed.  Constitutional:  Appearance: Normal appearance. She is obese.  Cardiovascular:     Rate and Rhythm: Normal rate and regular rhythm.  Pulmonary:     Effort: Pulmonary effort is normal.     Breath sounds: Normal breath sounds.  Musculoskeletal:        General: Normal range of motion.  Skin:    General: Skin is warm and dry.  Neurological:     Mental Status: She is alert.  Psychiatric:        Mood and Affect: Mood normal.        Behavior: Behavior normal.    BP 121/81 (BP Location: Left Arm, Patient Position: Sitting, Cuff Size: Normal)   Pulse 72   Temp 97.7 F (36.5 C) (Temporal)   Ht 5\' 4"  (1.626 m)   Wt 230 lb (104.3 kg)   LMP 03/16/2023 (Exact Date)   SpO2 100%   BMI 39.48 kg/m  Wt Readings from Last 3 Encounters:   04/14/23 230 lb (104.3 kg)  04/08/23 230 lb 8 oz (104.6 kg)  03/17/23 224 lb (101.6 kg)      Dulce Sellar, NP

## 2023-04-19 ENCOUNTER — Encounter: Payer: Self-pay | Admitting: Family

## 2023-04-19 DIAGNOSIS — R635 Abnormal weight gain: Secondary | ICD-10-CM | POA: Insufficient documentation

## 2023-04-19 NOTE — Assessment & Plan Note (Signed)
chronic broken foot, non-wt bearing currently, unable to exercise, feeling a little depressed as she is worried she will gain weight pt insurance will not cover GLP-1s sending low dose phentermine, 1/2 pill of 37.5mg , advised on use & SE advised on wt loss strategies including portion control, less carbs including sweets, eating most of calories earlier in day, drinking 64oz water qd, and establishing daily exercise routine.  f/u 1 month

## 2023-04-22 DIAGNOSIS — M79672 Pain in left foot: Secondary | ICD-10-CM | POA: Diagnosis not present

## 2023-04-22 DIAGNOSIS — S93622A Sprain of tarsometatarsal ligament of left foot, initial encounter: Secondary | ICD-10-CM | POA: Diagnosis not present

## 2023-05-04 NOTE — Therapy (Signed)
OUTPATIENT PHYSICAL THERAPY LOWER EXTREMITY EVALUATION   Patient Name: Jennifer Lynn MRN: [id number] DOB:April 11, 1996, 27 y.o., female Today's Date: 05/05/2023  END OF SESSION:  PT End of Session - 05/05/23 0845     Visit Number 1    Number of Visits 12    Date for PT Re-Evaluation 06/16/23    Authorization Type [employer]    PT Start Time 0845    PT Stop Time 0930    PT Time Calculation (min) 45 min    Activity Tolerance Patient tolerated treatment well    Behavior During Therapy [facility] for tasks assessed/performed             Past Medical History:  Diagnosis Date   Anemia    Anxiety    Blood transfusion without reported diagnosis    patient thinks she had after previous delivery   GERD (gastroesophageal reflux disease)    History of pre-eclampsia 10/17/2017   [x]  Aspirin 81 mg daily after 12 weeks  Current antihypertensives:  None      Baseline and surveillance labs (pulled in from [facility], refresh links as needed)       Lab Results  Component  Value  Date     PLT  288  04/25/2020     CREATININE  0.69  04/25/2020     AST  17  04/25/2020     ALT  18  04/25/2020     PROTCRRATIO  1.71 (H)  09/15/2017     LABPROT  14.9  02/20/2017        Antenatal Testing  CH   UTI (urinary tract infection)    Past Surgical History:  Procedure Laterality Date   CESAREAN SECTION N/A 09/16/2017   Procedure: CESAREAN SECTION;  Surgeon: Hissop Bing, MD;  Location: [facility] BIRTHING SUITES;  Service: Obstetrics;  Laterality: N/A;   CESAREAN SECTION N/A 10/31/2020   Procedure: CESAREAN SECTION;  Surgeon: Adam Phenix, MD;  Location: [facility] LD ORS;  Service: Obstetrics;  Laterality: N/A;   OPEN REDUCTION INTERNAL FIXATION (ORIF) FOOT LISFRANC FRACTURE Left 03/10/2023   Procedure: LEFT FOOT OPEN TREATMENT OF LISFRANC JOINT, OPEN TREATMENT OF CUNEIFORM FRACTURES, OPEN TREATMENT OF FIRST TARSOMETATARSAL JOINT;  Surgeon: Terance Hart, MD;  Location: [facility] OR;  Service: Orthopedics;  Laterality:  Left;   REPAIR EXTENSOR TENDON WITH METATARSAL OSTEOTOMY AND OPEN REDUCTION IN Left 03/10/2023   Procedure: OPEN TREATMENT OF METATARSAL FRACTURES;  Surgeon: Terance Hart, MD;  Location: [facility] OR;  Service: Orthopedics;  Laterality: Left;   WISDOM TOOTH EXTRACTION     Patient Active Problem List   Diagnosis Date Noted   Abnormal weight gain 04/19/2023   Generalized anxiety disorder 02/25/2023   Chronic thoracic back pain 06/26/2021   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   Anemia 06/03/2021   GERD (gastroesophageal reflux disease) 06/03/2021   Alpha thalassemia trait 03/17/2017    PCP: Dulce Sellar, NP   REFERRING PROVIDER: Terance Hart, MD   REFERRING DIAG: LEFT FOOT LISFRANC INJURY, CUNEIFORM FRACTURES, METATARSAL FRACTURES, POSSIBLE FIRST TARSOMETATARSAL JOINT INSTABILITY, POSSIBLE SECOND TARSAL METATARSAL JOINT INSTABILITY, POSSIBLE INTERCUNEIFORM JOINT INSTABILITY   Procedure: OPEN REDUCTION INTERNAL FIXATION (ORIF) FOOT LISFRANC FRACTURE  REPAIR EXTENSOR TENDON WITH METATARSAL OSTEOTOMY AND OPEN REDUCTION  INTERNAL FIXATION (ORIF) METATARSAL   THERAPY DIAG:  Stiffness of left ankle, not elsewhere classified  Other abnormalities of gait and mobility  Pain in left ankle and joints of left foot  Muscle weakness (generalized)  Rationale for Evaluation and  Treatment: Rehabilitation  ONSET DATE:  DOS 03/10/23 8 weeks today.  SUBJECTIVE:   SUBJECTIVE STATEMENT: Pt fell on 02/03/23 when she slipped on a jacket on the stairs. ORIF Surgery was 3/28. Cast came off 04/22/23. Now in boot all the time.   PERTINENT HISTORY: unremarkable PAIN:  Are you having pain? No  PRECAUTIONS: None  WEIGHT BEARING RESTRICTIONS: Yes WBAT  FALLS:  Has patient fallen in last 6 months? Yes. 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  PLOF: Independent  PATIENT GOALS:  to be able to put my foot in a shoe and walk   NEXT MD VISIT:  OBJECTIVE:   PATIENT SURVEYS:  LEFS 32 / 80 = 40.0 %  COGNITION: Overall cognitive status: Within functional limits for tasks assessed     SENSATION: Intermittent N/T on top of toes  EDEMA: mild edema compared to R  MUSCLE LENGTH: Tight heel cord L  POSTURE:  no significant issues except those caused by boot  PALPATION: TTP L gastroc/soleus, dorsum of foot  LOWER EXTREMITY ROM:  A/P ROM Right eval Left eval  Ankle dorsiflexion  -12//-5  Ankle plantarflexion  65  Ankle inversion  22/46  Ankle eversion  30/42   (Blank rows = not tested)  LOWER EXTREMITY MMT: B hip flex, knee flex/ext 5/5  MMT Right eval Left eval  Ankle dorsiflexion 5 4+  Ankle plantarflexion  4  Ankle inversion  4  Ankle eversion  4+   (Blank rows = not tested)   GAIT: Distance walked: 20 Assistive device utilized: None Level of assistance: Complete Independence Comments: in CAM boot with expected deviations   TODAY'S TREATMENT:                                                                                                                              DATE:   05/04/23 Eval Only; initial exercises shown to patient and current HEP reviewed. No handout given today.  PATIENT EDUCATION:  Education details: PT eval findings, anticipated POC, initial HEP, and discussed protocol and limitations right now with exercise  Person educated: Patient Education method: Medical illustrator Education comprehension: verbalized understanding and returned demonstration  HOME EXERCISE PROGRAM: TBD  ASSESSMENT:  CLINICAL IMPRESSION: Jennifer Lynn is a 26 y.o. female who was seen today for physical therapy evaluation and treatment  for left ankle stiffness s/p ORIF L FOOT LISFRANC FRACTURE REPAIR EXTENSOR TENDON WITH METATARSAL OSTEOTOMY AND OPEN REDUCTION INTERNAL FIXATION (ORIF) METATARSAL on 03/10/23 (8 weeks). She is WBAT in CAM boot and subsequently demonstrates gait deviations. She demonstrates decreased ankle ROM and strength, tightness in her gastroc/soleus and mild edema. She has mainly  soreness, but some pain in dorsum of the foot. Limitations are affecting most ADLs including stairs, gait, and her ability to exercise. She will benefit from skilled PT to address these deficits.    OBJECTIVE IMPAIRMENTS: Abnormal gait, decreased balance, decreased ROM, decreased strength, increased edema, increased muscle spasms, impaired flexibility, obesity, and pain.   ACTIVITY LIMITATIONS: standing, stairs, and locomotion level  PARTICIPATION LIMITATIONS: community activity  PERSONAL FACTORS:  N/A  are also affecting patient's functional outcome.   REHAB POTENTIAL: Excellent  CLINICAL DECISION MAKING: Stable/uncomplicated  EVALUATION COMPLEXITY: Low   GOALS: Goals reviewed with patient? Yes  SHORT TERM GOALS: Target date: 05/19/2023 Patient will be independent with initial HEP. Baseline: no formal HEP Goal status: INITIAL  2.  Patient able to amb household distances without CAM boot and no increased pain. Baseline: in CAM Boot Goal status: INITIAL   LONG TERM GOALS: Target date: 06/16/2023  Patient will be independent with advanced/ongoing HEP to improve outcomes and carryover.  Baseline: initial HEP Goal status: INITIAL  2.  Patient will report no increase in L ankle/foot pain with ADLs  to improve QOL. Baseline: soreness with movement and mild pain on dorsum of foot Goal status: INITIAL  3.  Patient will demonstrate improved L ankle AROM to [facility] to allow for normal gait and stair mechanics. Baseline: see objective chart above Goal status: INITIAL  4.  Patient will demonstrate improved L ankle strength to  5/5. Baseline: see objective chart above Goal status: INITIAL  5.  Patient will be able to ambulate 600' with a normal gait pattern without increased foot/ankle pain to access community.  Baseline: in CAM boot Goal status: INITIAL  6. Patient will be able to ascend/descend stairs with 1 HR and reciprocal step pattern safely to access home and community.  Baseline: step to gait on stairs Goal status: INITIAL  7.  Patient will report 41/80 on LEFS  to demonstrate improved functional ability. Baseline: 32/80 = 40% Goal status: INITIAL  PLAN:  PT FREQUENCY: 2x/week  PT DURATION: 6 weeks  PLANNED INTERVENTIONS: Therapeutic exercises, Therapeutic activity, Neuromuscular re-education, Balance training, Gait training, Patient/Family education, Self Care, Joint mobilization, Stair training, Aquatic Therapy, Dry Needling, Electrical stimulation, Cryotherapy, Moist heat, scar mobilization, Taping, Manual therapy, and Re-evaluation  PLAN FOR NEXT SESSION: Establish HEP with handouts, progress per protocol, work on weaning from CAM boot, stairs, gait, balance.   Tareka Jhaveri, PT 05/05/2023, 2:35 PM

## 2023-05-05 ENCOUNTER — Encounter: Payer: Self-pay | Admitting: Nurse Practitioner

## 2023-05-05 ENCOUNTER — Other Ambulatory Visit (HOSPITAL_COMMUNITY)
Admission: RE | Admit: 2023-05-05 | Discharge: 2023-05-05 | Disposition: A | Discharge status: Home / Self Care | Payer: Medicaid Other | Source: Ambulatory Visit | Attending: Nurse Practitioner | Admitting: Nurse Practitioner

## 2023-05-05 ENCOUNTER — Other Ambulatory Visit: Payer: Self-pay

## 2023-05-05 ENCOUNTER — Ambulatory Visit: Payer: Medicaid Other | Admitting: Nurse Practitioner

## 2023-05-05 ENCOUNTER — Encounter: Payer: Self-pay | Admitting: Physical Therapy

## 2023-05-05 ENCOUNTER — Ambulatory Visit: Payer: Medicaid Other | Attending: Orthopaedic Surgery | Admitting: Physical Therapy

## 2023-05-05 VITALS — BP 110/72 | HR 64 | Temp 98.0°F | Resp 16 | Ht 64.0 in | Wt 230.0 lb

## 2023-05-05 DIAGNOSIS — M25672 Stiffness of left ankle, not elsewhere classified: Secondary | ICD-10-CM | POA: Insufficient documentation

## 2023-05-05 DIAGNOSIS — N898 Other specified noninflammatory disorders of vagina: Secondary | ICD-10-CM

## 2023-05-05 DIAGNOSIS — R2689 Other abnormalities of gait and mobility: Secondary | ICD-10-CM | POA: Insufficient documentation

## 2023-05-05 DIAGNOSIS — M25572 Pain in left ankle and joints of left foot: Secondary | ICD-10-CM | POA: Insufficient documentation

## 2023-05-05 DIAGNOSIS — M6281 Muscle weakness (generalized): Secondary | ICD-10-CM | POA: Diagnosis not present

## 2023-05-05 LAB — POC URINALSYSI DIPSTICK (AUTOMATED)
Bilirubin, UA: NEGATIVE
Blood, UA: NEGATIVE
Glucose, UA: NEGATIVE
Ketones, UA: NEGATIVE
Leukocytes, UA: NEGATIVE
Nitrite, UA: NEGATIVE
Protein, UA: NEGATIVE
Spec Grav, UA: >=1.03 — AB (ref 1.010–1.025)
Urobilinogen, UA: NEGATIVE E.U./dL — AB
pH, UA: 5.5 (ref 5.0–8.0)

## 2023-05-05 MED ORDER — METRONIDAZOLE 0.75 % VA GEL: 1.0000 | Freq: Every day | VAGINAL | 0 refills | Status: DC

## 2023-05-05 NOTE — Progress Notes (Signed)
Acute Office Visit  Subjective:    Patient ID: Jennifer Lynn, female    DOB: 1996/04/17, 27 y.o.   MRN: [id number]  Chief Complaint  Patient presents with   Urinary Tract Infection    Pt c/o a watery discharge that smells like Ammonia and noticed some blood.  Started 2days ago   Vaginal Discharge The patient's primary symptoms include a genital odor and vaginal discharge. The patient's pertinent negatives include no genital itching, genital lesions, genital rash, missed menses, pelvic pain or vaginal bleeding. This is a new problem. The current episode started yesterday. The problem occurs constantly. The problem has been unchanged. The patient is experiencing no pain. She is not pregnant. Pertinent negatives include no abdominal pain, anorexia, back pain, chills, constipation, diarrhea, discolored urine, dysuria, fever, flank pain, frequency, headaches, hematuria, joint pain, joint swelling, nausea, painful intercourse, rash, sore throat, urgency or vomiting. The vaginal discharge was thin, malodorous and watery. The vaginal bleeding is typical of menses. She has not been passing clots. She has not been passing tissue. The symptoms are aggravated by intercourse. Treatments tried: boric acid. She is sexually active. It is possible that her partner has an STD. She uses nothing for contraception. Her menstrual history has been regular. Her past medical history is significant for vaginosis. There is no history of herpes simplex, menorrhagia, perineal abscess, PID, an STD or a terminated pregnancy.   Outpatient Medications Prior to Visit  Medication Sig   phentermine (ADIPEX-P) 37.5 MG tablet Take 0.5 tablets (18.75 mg total) by mouth daily before breakfast. Take 1/2 pill for 2 weeks, then go to 1 full pill.   No facility-administered medications prior to visit.   Reviewed past medical and social history.   Review of Systems  Constitutional:  Negative for chills and fever.  HENT:   Negative for sore throat.   Gastrointestinal:  Negative for abdominal pain, anorexia, constipation, diarrhea, nausea and vomiting.  Genitourinary:  Positive for vaginal discharge. Negative for dysuria, flank pain, frequency, hematuria, menorrhagia, missed menses, pelvic pain and urgency.  Musculoskeletal:  Negative for back pain and joint pain.  Skin:  Negative for rash.  Neurological:  Negative for headaches.      Objective:    Physical Exam Vitals and nursing note reviewed.  Abdominal:     General: There is no distension.     Palpations: Abdomen is soft.     Tenderness: There is no abdominal tenderness. There is no right CVA tenderness, left CVA tenderness or guarding.  Genitourinary:    Comments: Opted to obtain vaginal swab Neurological:     Mental Status: She is oriented to person, place, and time.    BP 110/72 (BP Location: Left Arm, Patient Position: Sitting, Cuff Size: Large)   Pulse 64   Temp 98 F (36.7 C)   Resp 16   Ht 5\' 4"  (1.626 m)   Wt 230 lb (104.3 kg)   LMP 03/16/2023 (Exact Date)   SpO2 99%   BMI 39.48 kg/m    Results for orders placed or performed in visit on 05/05/23  POCT Urinalysis Dipstick (Automated)  Result Value Ref Range   Color, UA Yellow    Clarity, UA Clear    Glucose, UA Negative Negative   Bilirubin, UA negative    Ketones, UA negative    Spec Grav, UA >=1.030 (A) 1.010 - 1.025   Blood, UA negaative    pH, UA 5.5 5.0 - 8.0   Protein, UA Negative Negative  Urobilinogen, UA negative (A) 0.2 or 1.0 E.U./dL   Nitrite, UA negative    Leukocytes, UA Negative Negative       Assessment & Plan:   Problem List Items Addressed This Visit   None Visit Diagnoses     Vaginal discharge    -  Primary   Relevant Medications   metroNIDAZOLE (METROGEL) 0.75 % vaginal gel   Other Relevant Orders   POCT Urinalysis Dipstick (Automated) (Completed)   Cervicovaginal ancillary only( [place])      Meds ordered this encounter   Medications   metroNIDAZOLE (METROGEL) 0.75 % vaginal gel    Sig: Place 1 Applicatorful vaginally at bedtime.    Dispense:  70 g    Refill:  0    Order Specific Question:   Supervising Provider    Answer:   Nadene Rubins ALFRED [[id number]]   No follow-ups on file.  Alysia Penna, NP

## 2023-05-05 NOTE — Patient Instructions (Signed)
Abstain from intercourse till symptoms resolve and you get lab results.

## 2023-05-06 LAB — CERVICOVAGINAL ANCILLARY ONLY
Bacterial Vaginitis (gardnerella): NEGATIVE
Candida Glabrata: NEGATIVE
Candida Vaginitis: NEGATIVE
Chlamydia: NEGATIVE
Comment: NEGATIVE
Comment: NEGATIVE
Comment: NEGATIVE
Comment: NEGATIVE
Comment: NEGATIVE
Comment: NORMAL
Neisseria Gonorrhea: NEGATIVE
Trichomonas: NEGATIVE

## 2023-05-09 NOTE — Therapy (Signed)
OUTPATIENT PHYSICAL THERAPY TREATMENT   Patient Name: FREIDA TIMMERMANS MRN: [id number] DOB:1996/04/19, 27 y.o., female Today's Date: 05/10/2023  END OF SESSION:  PT End of Session - 05/10/23 0844     Visit Number 2    Number of Visits 8    Date for PT Re-Evaluation 06/16/23    Authorization Type [employer]    Authorization - Visit Number 1    Authorization - Number of Visits 8    PT Start Time 0845    PT Stop Time 0931    PT Time Calculation (min) 46 min    Activity Tolerance Patient tolerated treatment well    Behavior During Therapy WFL for tasks assessed/performed              Past Medical History:  Diagnosis Date   Anemia    Anxiety    Blood transfusion without reported diagnosis    patient thinks she had after previous delivery   GERD (gastroesophageal reflux disease)    History of pre-eclampsia 10/17/2017   [x]  Aspirin 81 mg daily after 12 weeks  Current antihypertensives:  None      Baseline and surveillance labs (pulled in from [facility], refresh links as needed)       Lab Results  Component  Value  Date     PLT  288  04/25/2020     CREATININE  0.69  04/25/2020     AST  17  04/25/2020     ALT  18  04/25/2020     PROTCRRATIO  1.71 (H)  09/15/2017     LABPROT  14.9  02/20/2017        Antenatal Testing  CH   UTI (urinary tract infection)    Past Surgical History:  Procedure Laterality Date   CESAREAN SECTION N/A 09/16/2017   Procedure: CESAREAN SECTION;  Surgeon: [place] Bing, MD;  Location: [facility] BIRTHING SUITES;  Service: Obstetrics;  Laterality: N/A;   CESAREAN SECTION N/A 10/31/2020   Procedure: CESAREAN SECTION;  Surgeon: Adam Phenix, MD;  Location: [facility] LD ORS;  Service: Obstetrics;  Laterality: N/A;   OPEN REDUCTION INTERNAL FIXATION (ORIF) FOOT LISFRANC FRACTURE Left 03/10/2023   Procedure: LEFT FOOT OPEN TREATMENT OF LISFRANC JOINT, OPEN TREATMENT OF CUNEIFORM FRACTURES, OPEN TREATMENT OF FIRST TARSOMETATARSAL JOINT;  Surgeon: Terance Hart,  MD;  Location: [facility] OR;  Service: Orthopedics;  Laterality: Left;   REPAIR EXTENSOR TENDON WITH METATARSAL OSTEOTOMY AND OPEN REDUCTION IN Left 03/10/2023   Procedure: OPEN TREATMENT OF METATARSAL FRACTURES;  Surgeon: Terance Hart, MD;  Location: [facility] OR;  Service: Orthopedics;  Laterality: Left;   WISDOM TOOTH EXTRACTION     Patient Active Problem List   Diagnosis Date Noted   Abnormal weight gain 04/19/2023   Generalized anxiety disorder 02/25/2023   Chronic thoracic back pain 06/26/2021   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   Anemia 06/03/2021   GERD (gastroesophageal reflux disease) 06/03/2021   Alpha thalassemia trait 03/17/2017    PCP: Dulce Sellar, NP   REFERRING PROVIDER: Terance Hart, MD   REFERRING DIAG: LEFT FOOT LISFRANC INJURY, CUNEIFORM FRACTURES, METATARSAL FRACTURES, POSSIBLE FIRST TARSOMETATARSAL JOINT INSTABILITY, POSSIBLE SECOND TARSAL METATARSAL JOINT INSTABILITY, POSSIBLE INTERCUNEIFORM JOINT INSTABILITY   Procedure: OPEN REDUCTION INTERNAL FIXATION (ORIF) FOOT LISFRANC FRACTURE  REPAIR EXTENSOR TENDON WITH METATARSAL OSTEOTOMY AND OPEN REDUCTION  INTERNAL FIXATION (ORIF) METATARSAL   THERAPY DIAG:  Stiffness of left ankle, not elsewhere classified  Other abnormalities of gait and mobility  Pain in  left ankle and joints of left foot  Muscle weakness (generalized)  Rationale for Evaluation and Treatment: Rehabilitation  ONSET DATE:  DOS 03/10/23 8 weeks today.  SUBJECTIVE:   SUBJECTIVE STATEMENT: Went in the pool for an hour on Sunday and it felt pretty good, but really swollen after. Pain up to 7/10. Swelling better today. No pain.  PERTINENT HISTORY: unremarkable PAIN:  Are you having pain? No  PRECAUTIONS: None  WEIGHT BEARING RESTRICTIONS: Yes WBAT  FALLS:  Has patient fallen in last 6 months? Yes. 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  PLOF: Independent  PATIENT GOALS:  to be able to put my foot in a shoe and walk   NEXT MD VISIT:  OBJECTIVE:   PATIENT SURVEYS:  LEFS 32 / 80 = 40.0 %  COGNITION: Overall cognitive status: Within functional limits for tasks assessed     SENSATION: Intermittent N/T on top of toes  EDEMA: mild edema compared to R  MUSCLE LENGTH: Tight heel cord L  POSTURE:  no significant issues except those caused by boot  PALPATION: TTP L gastroc/soleus, dorsum of foot  LOWER EXTREMITY ROM:  A/P ROM Right eval Left eval  Ankle dorsiflexion  -12//-5  Ankle plantarflexion  65  Ankle inversion  22/46  Ankle eversion  30/42   (Blank rows = not tested)  LOWER EXTREMITY MMT: B hip flex, knee flex/ext 5/5  MMT Right eval Left eval  Ankle dorsiflexion 5 4+  Ankle plantarflexion  4  Ankle inversion  4  Ankle eversion  4+   (Blank rows = not tested)   GAIT: Distance walked: 20 Assistive device utilized: None Level of assistance: Complete Independence Comments: in CAM boot with expected deviations   TODAY'S TREATMENT:                                                                                                                              DATE:   05/10/23 Ankle circles, pumps, inv/ever x 20 ea Seated DF stretch in sitting pulling heel back 2x30 sec Towel scrunch x 10 Weight shifting side to side and stride stance with UE support B and then progressed to full step Gait post weight shift significantly improved Gastroc stretch - runners stretch at wall Isometrics reviewed  Standing arch contraction (short foot) - 5 sec hold x 5   05/04/23 Eval Only; initial exercises shown to patient and current HEP reviewed. No handout given today.  PATIENT EDUCATION:   Education details:  discussed stages of healing and what to expect in regard to pain and swelling; discussed practicing weight shifting and gait gradually while wearing shoes.  Person educated: Patient Education method:  Explanation and Demonstration Education comprehension: verbalized understanding and returned demonstration  HOME EXERCISE PROGRAM: Access Code: [email or web address] URL: [email or web address] Date: 05/10/2023 Prepared by: Raynelle Fanning  Exercises - CLX Ankle Dorsiflexion and Eversion (Mirrored)  - 1 x daily - 3-4 x weekly - 2 sets - 10 reps - Long Sitting Ankle Dorsiflexion with Anchored Resistance  - 1 x daily - 3-4 x weekly - 2 sets - 10 reps - Seated Ankle Inversion with Resistance and Legs Crossed  - 1 x daily - 3-4 x weekly - 2 sets - 10 reps - Seated Eccentric Ankle Plantar Flexion with Resistance - Straight Leg  - 1 x daily - 3-4 x weekly - 2 sets - 10 reps - Gastroc Stretch on Wall  - 2 x daily - 7 x weekly - 1 sets - 3 reps - 30-60 sec hold - Stride Stance Weight Shift  - 1 x daily - 3-4 x weekly - 1 sets - 10 reps - Seated Toe Towel Scrunches  - 1 x daily - 3-4 x weekly - 1 sets - 10 reps  ASSESSMENT:  CLINICAL IMPRESSION: Aiana reports some increased pain today after wearing water shoes to the pool on Sunday. She also had increased swelling but this resolved by today. We worked on weight shifting to help improve gait. She did not wear shoes today, but will next visit. She demonstrates good toe flexion and was able to tolerate standing arch contraction with mild discomfort. Rilie continues to demonstrate potential for improvement and would benefit from continued skilled therapy to address impairments.     OBJECTIVE IMPAIRMENTS: Abnormal gait, decreased balance, decreased ROM, decreased strength, increased edema, increased muscle spasms, impaired flexibility, obesity, and pain.   ACTIVITY LIMITATIONS: standing, stairs, and locomotion level  PARTICIPATION  LIMITATIONS: community activity  PERSONAL FACTORS:  N/A  are also affecting patient's functional outcome.   REHAB POTENTIAL: Excellent  CLINICAL DECISION MAKING: Stable/uncomplicated  EVALUATION COMPLEXITY: Low   GOALS: Goals reviewed with patient? Yes  SHORT TERM GOALS: Target date: 05/19/2023 Patient will be independent with initial HEP. Baseline: no formal HEP Goal status: INITIAL  2.  Patient able to amb household distances without CAM boot and no increased pain. Baseline: in CAM Boot Goal status: INITIAL   LONG TERM GOALS: Target date: 06/16/2023  Patient will be independent with advanced/ongoing HEP to improve outcomes and carryover.  Baseline: initial HEP Goal status: INITIAL  2.  Patient will report no increase in L ankle/foot pain with ADLs  to improve QOL. Baseline: soreness with movement and mild pain on dorsum of foot Goal status: INITIAL  3.  Patient will demonstrate improved L ankle AROM to [facility] to allow for normal gait and stair mechanics. Baseline: see objective chart above Goal status: INITIAL  4.  Patient will demonstrate improved L ankle strength to 5/5. Baseline: see objective chart above Goal status: INITIAL  5.  Patient will be able to ambulate 600' with a normal gait pattern without increased foot/ankle pain to access community.  Baseline: in CAM boot Goal status: INITIAL  6. Patient will be able to ascend/descend stairs with 1 HR and reciprocal step pattern safely to access home and community.  Baseline: step to gait on stairs Goal status: INITIAL  7.  Patient will report 41/80 on LEFS  to demonstrate improved functional ability. Baseline: 32/80 = 40% Goal status: INITIAL  PLAN:  PT FREQUENCY: 2x/week  PT DURATION: 6 weeks  PLANNED INTERVENTIONS: Therapeutic exercises, Therapeutic activity, Neuromuscular re-education, Balance training, Gait training,  Patient/Family education, Self Care, Joint mobilization, Stair training, Aquatic Therapy,  Dry Needling, Electrical stimulation, Cryotherapy, Moist heat, scar mobilization, Taping, Manual therapy, and Re-evaluation  PLAN FOR NEXT SESSION: progress per protocol, work on weaning from CAM boot, stairs, gait, balance.   Sewell Pitner, PT 05/10/2023, 1:59 PM

## 2023-05-10 ENCOUNTER — Ambulatory Visit: Payer: Medicaid Other | Admitting: Physical Therapy

## 2023-05-10 ENCOUNTER — Encounter: Payer: Self-pay | Admitting: Physical Therapy

## 2023-05-10 DIAGNOSIS — M25572 Pain in left ankle and joints of left foot: Secondary | ICD-10-CM

## 2023-05-10 DIAGNOSIS — M6281 Muscle weakness (generalized): Secondary | ICD-10-CM

## 2023-05-10 DIAGNOSIS — M25672 Stiffness of left ankle, not elsewhere classified: Secondary | ICD-10-CM | POA: Diagnosis not present

## 2023-05-10 DIAGNOSIS — R2689 Other abnormalities of gait and mobility: Secondary | ICD-10-CM

## 2023-05-12 ENCOUNTER — Ambulatory Visit: Payer: Medicaid Other

## 2023-05-12 DIAGNOSIS — M25572 Pain in left ankle and joints of left foot: Secondary | ICD-10-CM | POA: Diagnosis not present

## 2023-05-12 DIAGNOSIS — R2689 Other abnormalities of gait and mobility: Secondary | ICD-10-CM

## 2023-05-12 DIAGNOSIS — M6281 Muscle weakness (generalized): Secondary | ICD-10-CM | POA: Diagnosis not present

## 2023-05-12 DIAGNOSIS — M25672 Stiffness of left ankle, not elsewhere classified: Secondary | ICD-10-CM

## 2023-05-12 NOTE — Therapy (Signed)
OUTPATIENT PHYSICAL THERAPY TREATMENT   Patient Name: Jennifer Lynn MRN: [id number] DOB:1996-05-26, 27 y.o., female Today's Date: 05/12/2023  END OF SESSION:  PT End of Session - 05/12/23 1527     Visit Number 3    Date for PT Re-Evaluation 06/16/23    Authorization Type [employer]    Authorization - Visit Number 3    Authorization - Number of Visits 8    Progress Note Due on Visit 7    PT Start Time 1405    PT Stop Time 1445    PT Time Calculation (min) 40 min    Activity Tolerance Patient tolerated treatment well    Behavior During Therapy WFL for tasks assessed/performed              Past Medical History:  Diagnosis Date   Anemia    Anxiety    Blood transfusion without reported diagnosis    patient thinks she had after previous delivery   GERD (gastroesophageal reflux disease)    History of pre-eclampsia 10/17/2017   [x]  Aspirin 81 mg daily after 12 weeks  Current antihypertensives:  None      Baseline and surveillance labs (pulled in from [facility], refresh links as needed)       Lab Results  Component  Value  Date     PLT  288  04/25/2020     CREATININE  0.69  04/25/2020     AST  17  04/25/2020     ALT  18  04/25/2020     PROTCRRATIO  1.71 (H)  09/15/2017     LABPROT  14.9  02/20/2017        Antenatal Testing  CH   UTI (urinary tract infection)    Past Surgical History:  Procedure Laterality Date   CESAREAN SECTION N/A 09/16/2017   Procedure: CESAREAN SECTION;  Surgeon: Bradley Bing, MD;  Location: [facility] BIRTHING SUITES;  Service: Obstetrics;  Laterality: N/A;   CESAREAN SECTION N/A 10/31/2020   Procedure: CESAREAN SECTION;  Surgeon: Adam Phenix, MD;  Location: [facility] LD ORS;  Service: Obstetrics;  Laterality: N/A;   OPEN REDUCTION INTERNAL FIXATION (ORIF) FOOT LISFRANC FRACTURE Left 03/10/2023   Procedure: LEFT FOOT OPEN TREATMENT OF LISFRANC JOINT, OPEN TREATMENT OF CUNEIFORM FRACTURES, OPEN TREATMENT OF FIRST TARSOMETATARSAL JOINT;  Surgeon: Terance Hart, MD;  Location: [facility] OR;  Service: Orthopedics;  Laterality: Left;   REPAIR EXTENSOR TENDON WITH METATARSAL OSTEOTOMY AND OPEN REDUCTION IN Left 03/10/2023   Procedure: OPEN TREATMENT OF METATARSAL FRACTURES;  Surgeon: Terance Hart, MD;  Location: [facility] OR;  Service: Orthopedics;  Laterality: Left;   WISDOM TOOTH EXTRACTION     Patient Active Problem List   Diagnosis Date Noted   Abnormal weight gain 04/19/2023   Generalized anxiety disorder 02/25/2023   Chronic thoracic back pain 06/26/2021   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   Anemia 06/03/2021   GERD (gastroesophageal reflux disease) 06/03/2021   Alpha thalassemia trait 03/17/2017    PCP: Dulce Sellar, NP   REFERRING PROVIDER: Terance Hart, MD   REFERRING DIAG: LEFT FOOT LISFRANC INJURY, CUNEIFORM FRACTURES, METATARSAL FRACTURES, POSSIBLE FIRST TARSOMETATARSAL JOINT INSTABILITY, POSSIBLE SECOND TARSAL METATARSAL JOINT INSTABILITY, POSSIBLE INTERCUNEIFORM JOINT INSTABILITY   Procedure: OPEN REDUCTION INTERNAL FIXATION (ORIF) FOOT LISFRANC FRACTURE  REPAIR EXTENSOR TENDON WITH METATARSAL OSTEOTOMY AND OPEN REDUCTION  INTERNAL FIXATION (ORIF) METATARSAL   THERAPY DIAG:  Stiffness of left ankle, not elsewhere classified  Other abnormalities of gait and mobility  Pain in left ankle and joints of left foot  Muscle weakness (generalized)  Rationale for Evaluation and Treatment: Rehabilitation  ONSET DATE:  DOS 03/10/23 8 weeks today.  SUBJECTIVE:   SUBJECTIVE STATEMENT: "Doing really well, just can't get my foot in a shoe yet but I ordered some one size higher"  PERTINENT HISTORY: unremarkable PAIN:  Are you having pain? No  PRECAUTIONS: None  WEIGHT BEARING RESTRICTIONS: Yes WBAT  FALLS:  Has patient fallen in last 6 months? Yes. 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  PLOF: Independent  PATIENT GOALS:  to be able to put my foot in a shoe and walk   NEXT MD VISIT:  OBJECTIVE:   PATIENT SURVEYS:  LEFS 32 / 80 = 40.0 %  COGNITION: Overall cognitive status: Within functional limits for tasks assessed     SENSATION: Intermittent N/T on top of toes  EDEMA: mild edema compared to R  MUSCLE LENGTH: Tight heel cord L  POSTURE:  no significant issues except those caused by boot  PALPATION: TTP L gastroc/soleus, dorsum of foot  LOWER EXTREMITY ROM:  A/P ROM Right eval Left eval  Ankle dorsiflexion  -12//-5  Ankle plantarflexion  65  Ankle inversion  22/46  Ankle eversion  30/42   (Blank rows = not tested)  LOWER EXTREMITY MMT: B hip flex, knee flex/ext 5/5  MMT Right eval Left eval  Ankle dorsiflexion 5 4+  Ankle plantarflexion  4  Ankle inversion  4  Ankle eversion  4+   (Blank rows = not tested)   GAIT: Distance walked: 20 Assistive device utilized: None Level of assistance: Complete Independence Comments: in CAM boot with expected deviations   TODAY'S TREATMENT:                                                                                                                              DATE:  05/12/23 Toe and heel raises seated x 20 each Towel scrunch x 10  Inv/eversion towel push x 10 each direction Rocker board DF/PF x 2 min DF stretch with strap x 10 holding 10 sec each Ankle circles, pumps, inv/ever x 20 ea Standing uninvolved tap up onto 2 inch step; initially with UE support, then no UE support with ease Standing gastroc stretch x 10 holding 10 seconds, soleus stretch x 10 holding 10 sec Standing arch contraction (short foot) - 5 sec hold x 5 Step up onto balance pad and opposite hip flexion x 20 (10 with UE support and 10  without UE support)  DATE:   05/10/23 Ankle circles, pumps, inv/ever x 20 ea Seated DF stretch in sitting pulling heel back 2x30  sec Towel scrunch x 10 Weight shifting side to side and stride stance with UE support B and then progressed to full step Gait post weight shift significantly improved Gastroc stretch - runners stretch at wall Isometrics reviewed  Standing arch contraction (short foot) - 5 sec hold x 5   05/04/23 Eval Only; initial exercises shown to patient and current HEP reviewed. No handout given today.  PATIENT EDUCATION:  Education details:  discussed stages of healing and what to expect in regard to pain and swelling; discussed practicing weight shifting and gait gradually while wearing shoes.  Person educated: Patient Education method: Medical illustrator Education comprehension: verbalized understanding and returned demonstration  HOME EXERCISE PROGRAM: Access Code: [email or web address] URL: [email or web address] Date: 05/10/2023 Prepared by: Raynelle Fanning  Exercises - CLX Ankle Dorsiflexion and Eversion (Mirrored)  - 1 x daily - 3-4 x weekly - 2 sets - 10 reps - Long Sitting Ankle Dorsiflexion with Anchored Resistance  - 1 x daily - 3-4 x weekly - 2 sets - 10 reps - Seated Ankle Inversion with Resistance and Legs Crossed  - 1 x daily - 3-4 x weekly - 2 sets - 10 reps - Seated Eccentric Ankle Plantar Flexion with Resistance - Straight Leg  - 1 x daily - 3-4 x weekly - 2 sets - 10 reps - Gastroc Stretch on Wall  - 2 x daily - 7 x weekly - 1 sets - 3 reps - 30-60 sec hold - Stride Stance Weight Shift  - 1 x daily - 3-4 x weekly - 1 sets - 10 reps - Seated Toe Towel Scrunches  - 1 x daily - 3-4 x weekly - 1 sets - 10 reps  ASSESSMENT:  CLINICAL IMPRESSION: Saharah is progressing appropriately.  We transitioned into more weightbearing activity today and she was able to do this with ease.  She had very little discomfort with single leg step ups.  She completed  all tasks today without hesitation.  She demonstrates good weight shift and good ankle mobility.   Vannary continues to demonstrate potential for improvement and would benefit from continued skilled therapy to address impairments.     OBJECTIVE IMPAIRMENTS: Abnormal gait, decreased balance, decreased ROM, decreased strength, increased edema, increased muscle spasms, impaired flexibility, obesity, and pain.   ACTIVITY LIMITATIONS: standing, stairs, and locomotion level  PARTICIPATION LIMITATIONS: community activity  PERSONAL FACTORS:  N/A  are also affecting patient's functional outcome.   REHAB POTENTIAL: Excellent  CLINICAL DECISION MAKING: Stable/uncomplicated  EVALUATION COMPLEXITY: Low   GOALS: Goals reviewed with patient? Yes  SHORT TERM GOALS: Target date: 05/19/2023 Patient will be independent with initial HEP. Baseline: no formal HEP Goal status: MET  2.  Patient able to amb household distances without CAM boot and no increased pain. Baseline: in CAM Boot Goal status: INITIAL   LONG TERM GOALS: Target date: 06/16/2023  Patient will be independent with advanced/ongoing HEP to improve outcomes and carryover.  Baseline: initial HEP Goal status: INITIAL  2.  Patient will report no increase in L ankle/foot pain with ADLs  to improve QOL. Baseline: soreness with movement and mild pain on dorsum of foot Goal status: INITIAL  3.  Patient will demonstrate improved L ankle AROM to [facility] to allow for normal gait and stair mechanics. Baseline: see objective chart above Goal status: INITIAL  4.  Patient will demonstrate improved L ankle strength to 5/5. Baseline: see objective chart above Goal status: INITIAL  5.  Patient will be able to ambulate 600' with  a normal gait pattern without increased foot/ankle pain to access community.  Baseline: in CAM boot Goal status: INITIAL  6. Patient will be able to ascend/descend stairs with 1 HR and reciprocal step pattern safely to  access home and community.  Baseline: step to gait on stairs Goal status: INITIAL  7.  Patient will report 41/80 on LEFS  to demonstrate improved functional ability. Baseline: 32/80 = 40% Goal status: INITIAL  PLAN:  PT FREQUENCY: 2x/week  PT DURATION: 6 weeks  PLANNED INTERVENTIONS: Therapeutic exercises, Therapeutic activity, Neuromuscular re-education, Balance training, Gait training, Patient/Family education, Self Care, Joint mobilization, Stair training, Aquatic Therapy, Dry Needling, Electrical stimulation, Cryotherapy, Moist heat, scar mobilization, Taping, Manual therapy, and Re-evaluation  PLAN FOR NEXT SESSION: Progress to more weight bearing, progress per protocol, work on weaning from CAM boot, stairs, gait, balance.   Victorino Dike B. Nithila Sumners, PT 05/12/23 3:34 PM [facility] [address] [place], [place] [address] Phone # [phone] Fax [phone]

## 2023-05-16 DIAGNOSIS — E860 Dehydration: Secondary | ICD-10-CM | POA: Diagnosis not present

## 2023-05-16 DIAGNOSIS — Z7251 High risk heterosexual behavior: Secondary | ICD-10-CM | POA: Diagnosis not present

## 2023-05-16 DIAGNOSIS — R1084 Generalized abdominal pain: Secondary | ICD-10-CM | POA: Diagnosis not present

## 2023-05-17 DIAGNOSIS — R109 Unspecified abdominal pain: Secondary | ICD-10-CM | POA: Diagnosis not present

## 2023-05-17 DIAGNOSIS — Z7251 High risk heterosexual behavior: Secondary | ICD-10-CM | POA: Diagnosis not present

## 2023-05-18 ENCOUNTER — Ambulatory Visit: Payer: Medicaid Other

## 2023-05-18 ENCOUNTER — Ambulatory Visit: Payer: Medicaid Other | Admitting: Family

## 2023-05-30 ENCOUNTER — Ambulatory Visit: Payer: Medicaid Other | Attending: Orthopaedic Surgery

## 2023-05-30 DIAGNOSIS — M6281 Muscle weakness (generalized): Secondary | ICD-10-CM | POA: Insufficient documentation

## 2023-05-30 DIAGNOSIS — M79672 Pain in left foot: Secondary | ICD-10-CM | POA: Diagnosis not present

## 2023-05-30 DIAGNOSIS — M25672 Stiffness of left ankle, not elsewhere classified: Secondary | ICD-10-CM | POA: Insufficient documentation

## 2023-05-30 DIAGNOSIS — M25572 Pain in left ankle and joints of left foot: Secondary | ICD-10-CM | POA: Insufficient documentation

## 2023-05-30 DIAGNOSIS — R2689 Other abnormalities of gait and mobility: Secondary | ICD-10-CM | POA: Diagnosis not present

## 2023-05-30 NOTE — Therapy (Addendum)
OUTPATIENT PHYSICAL THERAPY TREATMENT   Patient Name: Jennifer Lynn MRN: [id number] DOB:September 19, 1996, 27 y.o., female Today's Date: 05/30/2023  END OF SESSION:  PT End of Session - 05/30/23 1615     Visit Number 4    Date for PT Re-Evaluation 06/16/23    Authorization Type [employer]- 8 visits 08/05/23-07/03/23    Authorization - Visit Number 4    Authorization - Number of Visits 8    PT Start Time 1533    PT Stop Time 1613    PT Time Calculation (min) 40 min    Activity Tolerance Patient tolerated treatment well    Behavior During Therapy [facility] for tasks assessed/performed               Past Medical History:  Diagnosis Date   Anemia    Anxiety    Blood transfusion without reported diagnosis    patient thinks she had after previous delivery   GERD (gastroesophageal reflux disease)    History of pre-eclampsia 10/17/2017   [x]  Aspirin 81 mg daily after 12 weeks  Current antihypertensives:  None      Baseline and surveillance labs (pulled in from [facility] refresh links as needed)       Lab Results  Component  Value  Date     PLT  288  04/25/2020     CREATININE  0.69  04/25/2020     AST  17  04/25/2020     ALT  18  04/25/2020     PROTCRRATIO  1.71 (H)  09/15/2017     LABPROT  14.9  02/20/2017        Antenatal Testing  CH   UTI (urinary tract infection)    Past Surgical History:  Procedure Laterality Date   CESAREAN SECTION N/A 09/16/2017   Procedure: CESAREAN SECTION;  Surgeon: Rice Bing, MD;  Location: [facility] BIRTHING SUITES;  Service: Obstetrics;  Laterality: N/A;   CESAREAN SECTION N/A 10/31/2020   Procedure: CESAREAN SECTION;  Surgeon: Adam Phenix, MD;  Location: [facility] LD ORS;  Service: Obstetrics;  Laterality: N/A;   OPEN REDUCTION INTERNAL FIXATION (ORIF) FOOT LISFRANC FRACTURE Left 03/10/2023   Procedure: LEFT FOOT OPEN TREATMENT OF LISFRANC JOINT, OPEN TREATMENT OF CUNEIFORM FRACTURES, OPEN TREATMENT OF FIRST TARSOMETATARSAL JOINT;  Surgeon: Terance Hart, MD;  Location: [facility] OR;  Service: Orthopedics;  Laterality: Left;   REPAIR EXTENSOR TENDON WITH METATARSAL OSTEOTOMY AND OPEN REDUCTION IN Left 03/10/2023   Procedure: OPEN TREATMENT OF METATARSAL FRACTURES;  Surgeon: Terance Hart, MD;  Location: [facility] OR;  Service: Orthopedics;  Laterality: Left;   WISDOM TOOTH EXTRACTION     Patient Active Problem List   Diagnosis Date Noted   Abnormal weight gain 04/19/2023   Generalized anxiety disorder 02/25/2023   Chronic thoracic back pain 06/26/2021   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   Anemia 06/03/2021   GERD (gastroesophageal reflux disease) 06/03/2021   Alpha thalassemia trait 03/17/2017    PCP: Dulce Sellar, NP   REFERRING PROVIDER: Terance Hart, MD   REFERRING DIAG: LEFT FOOT LISFRANC INJURY, CUNEIFORM FRACTURES, METATARSAL FRACTURES, POSSIBLE FIRST TARSOMETATARSAL JOINT INSTABILITY, POSSIBLE SECOND TARSAL METATARSAL JOINT INSTABILITY, POSSIBLE INTERCUNEIFORM JOINT INSTABILITY   Procedure: OPEN REDUCTION INTERNAL FIXATION (ORIF) FOOT LISFRANC FRACTURE  REPAIR EXTENSOR TENDON WITH METATARSAL OSTEOTOMY AND OPEN REDUCTION  INTERNAL FIXATION (ORIF) METATARSAL   THERAPY DIAG:  Stiffness of left ankle, not elsewhere classified  Other abnormalities of gait and mobility  Pain in left ankle and  joints of left foot  Muscle weakness (generalized)  Rationale for Evaluation and Treatment: Rehabilitation  ONSET DATE:  DOS 03/10/23 8 weeks today.  SUBJECTIVE:   SUBJECTIVE STATEMENT: I am not wearing the boot anymore.  I saw the MD today and he said I was doing well and can do whatever is comfortable.  I am going on vacation next week and I am determined to wear a shoe while I'm there.    PERTINENT HISTORY: unremarkable PAIN:  Are you having pain? No  PRECAUTIONS: None  WEIGHT BEARING RESTRICTIONS: Yes WBAT  FALLS:  Has patient fallen in last 6 months? Yes. 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  PLOF: Independent  PATIENT GOALS:  to be able to put my foot in a shoe and walk   NEXT MD VISIT:  OBJECTIVE:   PATIENT SURVEYS:  LEFS 32 / 80 = 40.0 %  COGNITION: Overall cognitive status: Within functional limits for tasks assessed     SENSATION: Intermittent N/T on top of toes  EDEMA: mild edema compared to R  MUSCLE LENGTH: Tight heel cord L  POSTURE:  no significant issues except those caused by boot  PALPATION: TTP L gastroc/soleus, dorsum of foot  LOWER EXTREMITY ROM:  A/P ROM Right eval Left eval  Ankle dorsiflexion  -12//-5  Ankle plantarflexion  65  Ankle inversion  22/46  Ankle eversion  30/42   (Blank rows = not tested)  LOWER EXTREMITY MMT: B hip flex, knee flex/ext 5/5  MMT Right eval Left eval  Ankle dorsiflexion 5 4+  Ankle plantarflexion  4  Ankle inversion  4  Ankle eversion  4+   (Blank rows = not tested)   GAIT: Distance walked: 20 Assistive device utilized: None Level of assistance: Complete Independence Comments: in CAM boot with expected deviations   TODAY'S TREATMENT:     DATE:  05/30/23 NuStep: Les only Level 2x6 min- PT present to discuss progress  6" step-ups 2x10 on Lt    6" step-down x15 with verbal and demo cues to prevent substitution Leg press: seat 6, 70# 2x10 bil Standing rockerboard x3 minutes  Sit to stand with 5# KB x20 Standing on balance pad: hip abduction and extension bil 2x10                                                                                                                 DATE:  05/12/23 Toe and heel raises seated x 20 each Towel scrunch x 10  Inv/eversion towel push x 10 each direction Rocker board DF/PF  x 2 min DF stretch with strap x 10 holding 10 sec each Ankle circles, pumps, inv/ever x 20 ea Standing uninvolved tap up onto 2 inch step;  initially with UE support, then no UE support with ease Standing gastroc stretch x 10 holding 10 seconds, soleus stretch x 10 holding 10 sec Standing arch contraction (short foot) - 5 sec hold x 5 Step up onto balance pad and opposite hip flexion x 20 (10 with UE support and 10 without UE support)  DATE:   05/10/23 Ankle circles, pumps, inv/ever x 20 ea Seated DF stretch in sitting pulling heel back 2x30 sec Towel scrunch x 10 Weight shifting side to side and stride stance with UE support B and then progressed to full step Gait post weight shift significantly improved Gastroc stretch - runners stretch at wall Isometrics reviewed  Standing arch contraction (short foot) - 5 sec hold x 5   05/04/23 Eval Only; initial exercises shown to patient and current HEP reviewed. No handout given today.  PATIENT EDUCATION:  Education details:  discussed stages of healing and what to expect in regard to pain and swelling; discussed practicing weight shifting and gait gradually while wearing shoes.  Person educated: Patient Education method: Medical illustrator Education comprehension: verbalized understanding and returned demonstration  HOME EXERCISE PROGRAM: Access Code: [email or web address] URL: [email or web address] Date: 05/30/2023 Prepared by: Tresa Endo  Exercises - CLX Ankle Dorsiflexion and Eversion (Mirrored)  - 1 x daily - 3-4 x weekly - 2 sets - 10 reps - Long Sitting Ankle Dorsiflexion with Anchored Resistance  - 1 x daily - 3-4 x weekly - 2 sets - 10 reps - Seated Ankle Inversion with Resistance and Legs Crossed  - 1 x daily - 3-4 x weekly - 2 sets - 10 reps - Seated Eccentric Ankle Plantar Flexion with Resistance - Straight Leg  - 1 x daily - 3-4 x weekly - 2 sets - 10 reps - Gastroc Stretch on Wall  - 2 x daily - 7 x weekly - 1 sets -  3 reps - 30-60 sec hold - Stride Stance Weight Shift  - 1 x daily - 3-4 x weekly - 1 sets - 10 reps - Seated Toe Towel Scrunches  - 1 x daily - 3-4 x weekly - 1 sets - 10 reps - Forward Step Down  - 2 x daily - 7 x weekly - 2 sets - 10 reps - Step Up  - 2 x daily - 7 x weekly - 2 sets - 10 reps  ASSESSMENT:  CLINICAL IMPRESSION: Pt with lapse in treatment.  She saw MD today and he cleared her to return to regular activity.  Pt would like to return to the gym and PT advised her regarding activity.  She will try treadmill at slow pace with symmetry, stepper and leg press.  PT provided verbal cueing for symmetry with gait and symmetry on steps and level surface was improved with training.  Limited gastroc length makes heel strike and step downs challenging. Patient will benefit from skilled PT to address the below impairments and improve overall function.     OBJECTIVE IMPAIRMENTS: Abnormal gait, decreased balance, decreased ROM, decreased strength, increased edema, increased muscle spasms, impaired flexibility, obesity, and pain.   ACTIVITY LIMITATIONS: standing, stairs, and locomotion level  PARTICIPATION LIMITATIONS: community activity  PERSONAL FACTORS:  N/A  are also affecting patient's functional outcome.   REHAB POTENTIAL: Excellent  CLINICAL DECISION MAKING: Stable/uncomplicated  EVALUATION COMPLEXITY: Low   GOALS: Goals reviewed with patient? Yes  SHORT TERM GOALS: Target date: 05/19/2023 Patient will be independent with initial HEP. Baseline: Goal status: MET  2.  Patient able to  amb household distances without CAM boot and no increased pain. Baseline: no boot for all distances and no pain (05/30/23) Goal status: MET   LONG TERM GOALS: Target date: 06/16/2023  Patient will be independent with advanced/ongoing HEP to improve outcomes and carryover.  Baseline: initial HEP Goal status: INITIAL  2.  Patient will report no increase in L ankle/foot pain with ADLs  to improve  QOL. Baseline: no pain (05/30/23) Goal status: MET  3.  Patient will demonstrate improved L ankle AROM to [facility] to allow for normal gait and stair mechanics. Baseline: see objective chart above Goal status: INITIAL  4.  Patient will demonstrate improved L ankle strength to 5/5. Baseline: see objective chart above Goal status: INITIAL  5.  Patient will be able to ambulate 600' with a normal gait pattern without increased foot/ankle pain to access community.  Baseline: normal shoes and antalgia with reduced time on Lt LE (05/30/23) Goal status: IN PROGRESS  6. Patient will be able to ascend/descend stairs with 1 HR and reciprocal step pattern safely to access home and community.  Baseline: step to gait on stairs Goal status: INITIAL  7.  Patient will report 41/80 on LEFS  to demonstrate improved functional ability. Baseline: 32/80 = 40% Goal status: INITIAL  PLAN:  PT FREQUENCY: 2x/week  PT DURATION: 6 weeks  PLANNED INTERVENTIONS: Therapeutic exercises, Therapeutic activity, Neuromuscular re-education, Balance training, Gait training, Patient/Family education, Self Care, Joint mobilization, Stair training, Aquatic Therapy, Dry Needling, Electrical stimulation, Cryotherapy, Moist heat, scar mobilization, Taping, Manual therapy, and Re-evaluation  PLAN FOR NEXT SESSION: work on steps, symmetry with gait   Lorrene Reid, PT 05/30/23 4:17 PM   PHYSICAL THERAPY DISCHARGE SUMMARY  Visits from Start of Care: 4  Current functional level related to goals / functional outcomes: See above for current status.  Pt didn't return to PT.    Remaining deficits: See above    Education / Equipment: HEP   Patient agrees to discharge. Patient goals were not met. Patient is being discharged due to not returning since the last visit.  Lorrene Reid, PT 01/12/24 7:48 AM   [facility] [address] [place], [place] [address] Phone # [phone] Fax  [phone]

## 2023-05-31 ENCOUNTER — Encounter: Payer: Medicaid Other | Admitting: Physical Therapy

## 2023-06-02 ENCOUNTER — Encounter: Payer: Medicaid Other | Admitting: Physical Therapy

## 2023-06-02 DIAGNOSIS — M79672 Pain in left foot: Secondary | ICD-10-CM | POA: Diagnosis not present

## 2023-06-07 ENCOUNTER — Encounter: Payer: Medicaid Other | Admitting: Physical Therapy

## 2023-06-09 ENCOUNTER — Encounter: Payer: Medicaid Other | Admitting: Physical Therapy

## 2023-06-14 ENCOUNTER — Ambulatory Visit: Payer: Medicaid Other

## 2023-06-17 ENCOUNTER — Ambulatory Visit: Payer: Medicaid Other

## 2023-06-21 ENCOUNTER — Ambulatory Visit: Payer: Medicaid Other | Admitting: Physical Therapy

## 2023-06-23 ENCOUNTER — Ambulatory Visit: Payer: Medicaid Other | Admitting: Physical Therapy

## 2023-06-24 ENCOUNTER — Encounter: Payer: Self-pay | Admitting: Family

## 2023-06-24 ENCOUNTER — Ambulatory Visit: Payer: Medicaid Other | Admitting: Family

## 2023-06-24 VITALS — BP 114/78 | HR 78 | Temp 97.5°F | Ht 64.0 in | Wt 237.6 lb

## 2023-06-24 DIAGNOSIS — F320 Major depressive disorder, single episode, mild: Secondary | ICD-10-CM | POA: Diagnosis not present

## 2023-06-24 DIAGNOSIS — F411 Generalized anxiety disorder: Secondary | ICD-10-CM

## 2023-06-24 MED ORDER — ESCITALOPRAM OXALATE 5 MG PO TABS: 5.0000 mg | ORAL_TABLET | Freq: Every day | ORAL | 1 refills | Status: DC

## 2023-06-24 NOTE — Assessment & Plan Note (Addendum)
chronic, Unstable GAD score high today, discussed medications, prn vs daily starting Lexapro 5mg , advised on use & SE sending therapy referral continue to advise on lifestyle modifications for anxiety including exercise, listening to music, meditation, yoga, etc. f/u 1 mo

## 2023-06-24 NOTE — Patient Instructions (Signed)
It was very nice to see you today!   I sent your new medication, generic Lexapro, to [facility].  I have sent a referral to our therapy team for counseling.  But you have to call their office to schedule your first appointment. Start with [facility] at [phone] if they can't schedule you quickly you can try [facility] at [phone].       PLEASE NOTE:  If you had any lab tests please let us know if you have not heard back within a few days. You may see your results on MyChart before we have a chance to review them but we will give you a call once they are reviewed by [place]. If we ordered any referrals today, please let us know if you have not heard from their office within the next week.

## 2023-06-24 NOTE — Progress Notes (Signed)
Patient ID: Jennifer Lynn, female    DOB: Oct 30, 1996, 27 y.o.   MRN: [id number]  Chief Complaint  Patient presents with   Anxiety and Depression    Discuss medications    HPI: Anxiety/Depression:  HX:  Last seen 4mos ago for anxiety - GAD score high then and now - reports having for years, had been seeing a therapist regularly twice a week, but was EAP thru her work & they don't accept her insurance, needs new therapy. Feels she now does need to start a medication. Also upset she has gained more weight, states she is an emotional eater. Pt tearful during visit.     06/24/2023    1:19 PM  Depression screen PHQ 2/9  Decreased Interest 3  Down, Depressed, Hopeless 3  PHQ - 2 Score 6  Altered sleeping 3  Tired, decreased energy 3  Change in appetite 3  Feeling bad or failure about yourself  3  Trouble concentrating 3  Moving slowly or fidgety/restless 3  Suicidal thoughts 3  PHQ-9 Score 27  Difficult doing work/chores Extremely dIfficult      06/24/2023    1:19 PM  GAD 7 : Generalized Anxiety Score  Nervous, Anxious, on Edge 3  Control/stop worrying 3  Worry too much - different things 3  Trouble relaxing 3  Restless 3  Easily annoyed or irritable 3  Afraid - awful might happen 3  Total GAD 7 Score 21  Anxiety Difficulty Extremely difficult    Assessment & Plan:  Generalized anxiety disorder Assessment & Plan: chronic, Unstable GAD score high today, discussed medications, prn vs daily starting Lexapro 5mg , advised on use & SE sending therapy referral continue to advise on lifestyle modifications for anxiety including exercise, listening to music, meditation, yoga, etc. f/u 1 mo  Orders: -     Escitalopram Oxalate; Take 1 tablet (5 mg total) by mouth daily.  Dispense: 30 tablet; Refill: 1  Depression, major, single episode, mild (HCC) Assessment & Plan: New, Unstable PHQ-9 score high today discussed medications & importance of finding new  therapist starting Lexapro 5mg , advised on use & SE sending therapy referral continue to advise on lifestyle modifications for anxiety including exercise, listening to music, meditation, yoga, etc. f/u 1 mo    Subjective:    Outpatient Medications Prior to Visit  Medication Sig Dispense Refill   phentermine (ADIPEX-P) 37.5 MG tablet Take 0.5 tablets (18.75 mg total) by mouth daily before breakfast. Take 1/2 pill for 2 weeks, then go to 1 full pill. (Patient not taking: Reported on 06/24/2023) 30 tablet 0   metroNIDAZOLE (METROGEL) 0.75 % vaginal gel Place 1 Applicatorful vaginally at bedtime. (Patient not taking: Reported on 06/24/2023) 70 g 0   No facility-administered medications prior to visit.   Past Medical History:  Diagnosis Date   Anemia    Anxiety    Blood transfusion without reported diagnosis    patient thinks she had after previous delivery   GERD (gastroesophageal reflux disease)    History of pre-eclampsia 10/17/2017   [x]  Aspirin 81 mg daily after 12 weeks  Current antihypertensives:  None      Baseline and surveillance labs (pulled in from [facility], refresh links as needed)       Lab Results  Component  Value  Date     PLT  288  04/25/2020     CREATININE  0.69  04/25/2020     AST  17  04/25/2020     ALT  18  04/25/2020     PROTCRRATIO  1.71 (H)  09/15/2017     LABPROT  14.9  02/20/2017        Antenatal Testing  CH   UTI (urinary tract infection)    Past Surgical History:  Procedure Laterality Date   CESAREAN SECTION N/A 09/16/2017   Procedure: CESAREAN SECTION;  Surgeon: [place] Bing, MD;  Location: [facility] BIRTHING SUITES;  Service: Obstetrics;  Laterality: N/A;   CESAREAN SECTION N/A 10/31/2020   Procedure: CESAREAN SECTION;  Surgeon: Adam Phenix, MD;  Location: [facility] LD ORS;  Service: Obstetrics;  Laterality: N/A;   OPEN REDUCTION INTERNAL FIXATION (ORIF) FOOT LISFRANC FRACTURE Left 03/10/2023   Procedure: LEFT FOOT OPEN TREATMENT OF LISFRANC JOINT, OPEN TREATMENT OF  CUNEIFORM FRACTURES, OPEN TREATMENT OF FIRST TARSOMETATARSAL JOINT;  Surgeon: Terance Hart, MD;  Location: [facility] OR;  Service: Orthopedics;  Laterality: Left;   REPAIR EXTENSOR TENDON WITH METATARSAL OSTEOTOMY AND OPEN REDUCTION IN Left 03/10/2023   Procedure: OPEN TREATMENT OF METATARSAL FRACTURES;  Surgeon: Terance Hart, MD;  Location: [facility] OR;  Service: Orthopedics;  Laterality: Left;   WISDOM TOOTH EXTRACTION     No Known Allergies    Objective:    Physical Exam Vitals and nursing note reviewed.  Constitutional:      Appearance: Normal appearance. She is obese.  Cardiovascular:     Rate and Rhythm: Normal rate and regular rhythm.  Pulmonary:     Effort: Pulmonary effort is normal.     Breath sounds: Normal breath sounds.  Musculoskeletal:        General: Normal range of motion.  Skin:    General: Skin is warm and dry.  Neurological:     Mental Status: She is alert.  Psychiatric:        Mood and Affect: Mood normal.        Behavior: Behavior normal.    BP 114/78   Pulse 78   Temp (!) 97.5 F (36.4 C) (Temporal)   Ht 5\' 4"  (1.626 m)   Wt 237 lb 9.6 oz (107.8 kg)   SpO2 98%   BMI 40.78 kg/m  Wt Readings from Last 3 Encounters:  06/24/23 237 lb 9.6 oz (107.8 kg)  05/05/23 230 lb (104.3 kg)  04/14/23 230 lb (104.3 kg)       Dulce Sellar, NP

## 2023-06-24 NOTE — Assessment & Plan Note (Signed)
New, Unstable PHQ-9 score high today discussed medications & importance of finding new therapist starting Lexapro 5mg , advised on use & SE sending therapy referral continue to advise on lifestyle modifications for anxiety including exercise, listening to music, meditation, yoga, etc. f/u 1 mo

## 2023-06-25 ENCOUNTER — Other Ambulatory Visit: Payer: Self-pay

## 2023-06-25 ENCOUNTER — Emergency Department (HOSPITAL_BASED_OUTPATIENT_CLINIC_OR_DEPARTMENT_OTHER)
Admission: EM | Admit: 2023-06-25 | Discharge: 2023-06-25 | Disposition: A | Discharge status: Home / Self Care | Payer: Medicaid Other | Attending: Emergency Medicine | Admitting: Emergency Medicine

## 2023-06-25 DIAGNOSIS — N939 Abnormal uterine and vaginal bleeding, unspecified: Secondary | ICD-10-CM | POA: Insufficient documentation

## 2023-06-25 DIAGNOSIS — N946 Dysmenorrhea, unspecified: Secondary | ICD-10-CM | POA: Insufficient documentation

## 2023-06-25 DIAGNOSIS — R319 Hematuria, unspecified: Secondary | ICD-10-CM | POA: Insufficient documentation

## 2023-06-25 LAB — BASIC METABOLIC PANEL
Anion gap: 7 (ref 5–15)
BUN: 15 mg/dL (ref 6–20)
CO2: 26 mmol/L (ref 22–32)
Calcium: 9.4 mg/dL (ref 8.9–10.3)
Chloride: 105 mmol/L (ref 98–111)
Creatinine, Ser: 0.87 mg/dL (ref 0.44–1.00)
GFR, Estimated: >60 mL/min (ref >60)
Glucose, Bld: 98 mg/dL (ref 70–99)
Potassium: 3.7 mmol/L (ref 3.5–5.1)
Sodium: 138 mmol/L (ref 135–145)

## 2023-06-25 LAB — CBC
HCT: 37.5 % (ref 36.0–46.0)
Hemoglobin: 12 g/dL (ref 12.0–15.0)
MCH: 26.8 pg (ref 26.0–34.0)
MCHC: 32 g/dL (ref 30.0–36.0)
MCV: 83.7 fL (ref 80.0–100.0)
Platelets: 293 10*3/uL (ref 150–400)
RBC: 4.48 MIL/uL (ref 3.87–5.11)
RDW: 14 % (ref 11.5–15.5)
WBC: 6.8 10*3/uL (ref 4.0–10.5)
nRBC: 0 % (ref 0.0–0.2)

## 2023-06-25 LAB — URINALYSIS, ROUTINE W REFLEX MICROSCOPIC
Bilirubin Urine: NEGATIVE
Glucose, UA: NEGATIVE mg/dL
Nitrite: NEGATIVE
Protein, ur: 30 mg/dL — AB
RBC / HPF: >50 RBC/hpf (ref 0–5)
Specific Gravity, Urine: 1.032 — ABNORMAL HIGH (ref 1.005–1.030)
WBC, UA: >50 WBC/hpf (ref 0–5)
pH: 5.5 (ref 5.0–8.0)

## 2023-06-25 LAB — WET PREP, GENITAL
Clue Cells Wet Prep HPF POC: NONE SEEN
Sperm: NONE SEEN
Trich, Wet Prep: NONE SEEN
WBC, Wet Prep HPF POC: <10 (ref <10)
Yeast Wet Prep HPF POC: NONE SEEN

## 2023-06-25 LAB — PREGNANCY, URINE: Preg Test, Ur: NEGATIVE

## 2023-06-25 MED ORDER — NAPROXEN 500 MG PO TABS: 500.0000 mg | ORAL_TABLET | Freq: Two times a day (BID) | ORAL | 0 refills | Status: DC

## 2023-06-25 NOTE — ED Triage Notes (Addendum)
Reports blood in urine starting this AM, reports to be dark blood. No difficulty voiding. Denies vaginal bleeding. Cramping abd pains and bilateral flank pain. Small clots visualized in sample sent from triage.  No meds. No hx.

## 2023-06-25 NOTE — ED Provider Notes (Signed)
[place] [facility]GENC[facility] DEPARTMENT AT [facility] Provider Note   CSN: [id number] Arrival date & time: 06/25/23  1851     History  Chief Complaint  Patient presents with   Flank Pain    Jennifer Lynn is a 27 y.o. female.  Patient with history of anemia and GERD presents today with complaints of hematuria.  She states that same began this morning.  She is not supposed to have a menstrual cycle for 8 more days and does not think that the blood is coming from her vagina.  She states that she originally noticed the bright red blood in the toilet when she peed this morning.  She states that a few minutes later she felt a wet sensation in her underwear and thought that she was likely having her regular discharge.  However, when she went to the bathroom she realized her underwear was soaked with blood.  After this, she put a tampon in her vagina and then immediately pulled it out and it was dry.  Given this, she presumed that the blood was coming from her urethra.  Soon after this she noticed that she was having cramping pain in her lower pelvic area as well as in her low back and her thighs.  She denies any unilateral flank pain.  She states this is similar to pain she has when she gets her menstrual cycle, however it seems a bit more intense than normal.  She states that her menstrual cycle is never early.  She denies any fevers, chills, nausea, vomiting, diarrhea, dysuria, vaginal discharge, hematochezia, or melena.  The history is provided by the patient. No language interpreter was used.  Flank Pain       Home Medications Prior to Admission medications   Medication Sig Start Date End Date Taking? Authorizing Provider  escitalopram (LEXAPRO) 5 MG tablet Take 1 tablet (5 mg total) by mouth daily. 06/24/23   Dulce Sellar, NP  phentermine (ADIPEX-P) 37.5 MG tablet Take 0.5 tablets (18.75 mg total) by mouth daily before breakfast. Take 1/2 pill for 2 weeks, then go to 1 full  pill. Patient not taking: Reported on 06/24/2023 04/14/23   Dulce Sellar, NP      Allergies    Patient has no known allergies.    Review of Systems   Review of Systems  Genitourinary:  Positive for pelvic pain and vaginal bleeding.  All other systems reviewed and are negative.   Physical Exam Updated Vital Signs BP 137/85   Pulse 78   Temp 99 F (37.2 C)   Resp 16   Ht 5\' 4"  (1.626 m)   Wt 107.5 kg   SpO2 100%   BMI 40.68 kg/m  Physical Exam Vitals and nursing note reviewed. Exam conducted with a chaperone present.  Constitutional:      General: She is not in acute distress.    Appearance: Normal appearance. She is normal weight. She is not ill-appearing, toxic-appearing or diaphoretic.  HENT:     Head: Normocephalic and atraumatic.  Cardiovascular:     Rate and Rhythm: Normal rate.  Pulmonary:     Effort: Pulmonary effort is normal. No respiratory distress.  Abdominal:     General: Abdomen is flat.     Palpations: Abdomen is soft.     Tenderness: There is no abdominal tenderness. There is no right CVA tenderness or left CVA tenderness.     Comments: Generalized pelvic TTP without rebound or guarding  Genitourinary:    Comments: Moderate  bleeding with clots present throughout the vagina.  No signs of blood from the urethra or any urethral or vaginal injury.  No CMT or adnexal abnormalities or tenderness to palpation.  No cervical abnormalities.  No discharge Musculoskeletal:        General: Normal range of motion.     Cervical back: Normal range of motion.  Skin:    General: Skin is warm and dry.  Neurological:     General: No focal deficit present.     Mental Status: She is alert.  Psychiatric:        Mood and Affect: Mood normal.        Behavior: Behavior normal.     ED Results / Procedures / Treatments   Labs (all labs ordered are listed, but only abnormal results are displayed) Labs Reviewed  URINALYSIS, ROUTINE W REFLEX MICROSCOPIC - Abnormal;  Notable for the following components:      Result Value   APPearance HAZY (*)    Specific Gravity, Urine 1.032 (*)    Hgb urine dipstick LARGE (*)    Ketones, ur TRACE (*)    Protein, ur 30 (*)    Leukocytes,Ua SMALL (*)    Bacteria, UA FEW (*)    All other components within normal limits  WET PREP, GENITAL  PREGNANCY, URINE  BASIC METABOLIC PANEL  CBC  GC/CHLAMYDIA PROBE AMP ([place]) NOT AT [facility]    EKG None  Radiology No results found.  Procedures Procedures    Medications Ordered in ED Medications - No data to display  ED Course/ Medical Decision Making/ A&P                             Medical Decision Making Amount and/or Complexity of Data Reviewed Labs: ordered.   This patient is a 27 y.o. female who presents to the ED for concern of vaginal bleeding, low pelvic pain, this involves an extensive number of treatment options, and is a complaint that carries with it a high risk of complications and morbidity. The emergent differential diagnosis prior to evaluation includes, but is not limited to,  Abnormal uterine bleeding, threatened miscarriage, incomplete miscarriage, normal bleeding from an early trimester pregnancy, ectopic pregnancy, vaginal/cervical trauma, subchorionic hemorrhage/hematoma  This is not an exhaustive differential.   Past Medical History / Co-morbidities / Social History: history of anemia and GERD   Physical Exam: Physical exam performed. The pertinent findings include: Per above, moderate blood with clots present in the vagina without discharge, CMT or adnexa tenderness. No urethral injury or signs of bleeding from the urethra   Lab Tests: I ordered, and personally interpreted labs.  The pertinent results include:  UA wit blood, some signs of infection, however no UTI symptoms and therefore will defer treatment.  No other acute laboratory abnormalities.  GC/chlamydia pending.  Disposition: After consideration of the diagnostic results  and the patients response to treatment, I feel that emergency department workup does not suggest an emergent condition requiring admission or immediate intervention beyond what has been performed at this time. The plan is: discharge with naproxen and close pcp follow-up with return precautions.  Patient's main concern was that the bleeding was coming from her urethra, however on exam blood is definitely coming from her vagina.  I did offer additional evaluation with ultrasound or CT imaging, however patient states that the main concern was bleeding from her urethra and her pain is similar to her previous  menstrual cycles however a bit more intense.  After long discussion, patient would prefer to go home with medication and close follow-up with return precautions given.  Suspect this is just a normal menstrual cycle with associated cramping.  Her pregnancy test was negative. Evaluation and diagnostic testing in the emergency department does not suggest an emergent condition requiring admission or immediate intervention beyond what has been performed at this time.  Plan for discharge with close PCP follow-up.  Patient is understanding and amenable with plan, educated on red flag symptoms that would prompt immediate return.  Patient discharged in stable condition.  Final Clinical Impression(s) / ED Diagnoses Final diagnoses:  Vaginal bleeding  Menstrual cramps    Rx / DC Orders ED Discharge Orders          Ordered    naproxen (NAPROSYN) 500 MG tablet  2 times daily        06/25/23 2328          An After Visit Summary was printed and given to the patient.     Vear Clock 06/25/23 2329    Tegeler, Canary Brim, MD 06/25/23 [phone]

## 2023-06-25 NOTE — Discharge Instructions (Signed)
As we discussed, the bleeding you are experiencing is coming from your vagina and is likely just a menstrual cycle that is early.  The pain you are experiencing is likely cramping associated with this.  I have given you a prescription for naproxen for you to take as prescribed as needed for management of your pain.  Otherwise, I recommend that you manage her symptoms as you do for your normal menstrual cycles and follow-up with your primary doctor.  If your pain does not improve or gets worse, you may need to return for additional imaging.  Return if development of any new or worsening symptoms.

## 2023-06-27 LAB — GC/CHLAMYDIA PROBE AMP (~~LOC~~) NOT AT ARMC
Chlamydia: NEGATIVE
Comment: NEGATIVE
Comment: NORMAL
Neisseria Gonorrhea: NEGATIVE

## 2023-07-07 ENCOUNTER — Ambulatory Visit: Payer: Medicaid Other | Admitting: Family

## 2023-07-07 VITALS — BP 122/85 | HR 72 | Ht 64.0 in | Wt 245.0 lb

## 2023-07-07 DIAGNOSIS — F320 Major depressive disorder, single episode, mild: Secondary | ICD-10-CM | POA: Diagnosis not present

## 2023-07-07 DIAGNOSIS — F411 Generalized anxiety disorder: Secondary | ICD-10-CM

## 2023-07-07 NOTE — Assessment & Plan Note (Addendum)
chronic, Unstable started Lexapro 5mg , tolerating, only 2w, but thinks it is starting to help sent therapy referral no refill needed today pt has FMLA paperwork needing completed f/u 1 mo

## 2023-07-07 NOTE — Progress Notes (Signed)
Patient ID: Jennifer Lynn, female    DOB: Sep 24, 1996, 27 y.o.   MRN: [id number]  Chief Complaint  Patient presents with   FMLA    Anxiety    HPI: Anxiety/Depression:  HX:  Last seen 4mos ago for anxiety - GAD score high then and now - reports having for years, had been seeing a therapist regularly twice a week, but was EAP thru her work & they don't accept her insurance, needs new therapy. Feels she now does need to start a medication. Also upset she has gained more weight, states she is an emotional eater. Pt tearful during visit. *Today, started Lexapro 5mg  daily, tolerating, has started helping slightly, here to have FMLA paperwork completed.    06/24/2023    1:19 PM  Depression screen PHQ 2/9  Decreased Interest 3  Down, Depressed, Hopeless 3  PHQ - 2 Score 6  Altered sleeping 3  Tired, decreased energy 3  Change in appetite 3  Feeling bad or failure about yourself  3  Trouble concentrating 3  Moving slowly or fidgety/restless 3  Suicidal thoughts 3  PHQ-9 Score 27  Difficult doing work/chores Extremely dIfficult      06/24/2023    1:19 PM  GAD 7 : Generalized Anxiety Score  Nervous, Anxious, on Edge 3  Control/stop worrying 3  Worry too much - different things 3  Trouble relaxing 3  Restless 3  Easily annoyed or irritable 3  Afraid - awful might happen 3  Total GAD 7 Score 21  Anxiety Difficulty Extremely difficult    Assessment & Plan:  Generalized anxiety disorder Assessment & Plan: chronic, Unstable started Lexapro 5mg , tolerating, only 2w, but thinks it is starting to help sent therapy referral no refill needed today pt has FMLA paperwork needing completed f/u 1 mo   Depression, major, single episode, mild (HCC) Assessment & Plan: chronic, Unstable started Lexapro 5mg , tolerating, only 2w, but thinks it is starting to help sent therapy referral no refill needed today pt has FMLA paperwork needing completed f/u 1 mo     Subjective:     Outpatient Medications Prior to Visit  Medication Sig Dispense Refill   escitalopram (LEXAPRO) 5 MG tablet Take 1 tablet (5 mg total) by mouth daily. 30 tablet 1   naproxen (NAPROSYN) 500 MG tablet Take 1 tablet (500 mg total) by mouth 2 (two) times daily. 30 tablet 0   phentermine (ADIPEX-P) 37.5 MG tablet Take 0.5 tablets (18.75 mg total) by mouth daily before breakfast. Take 1/2 pill for 2 weeks, then go to 1 full pill. (Patient not taking: Reported on 06/24/2023) 30 tablet 0   No facility-administered medications prior to visit.   Past Medical History:  Diagnosis Date   Anemia    Anxiety    Blood transfusion without reported diagnosis    patient thinks she had after previous delivery   GERD (gastroesophageal reflux disease)    History of pre-eclampsia 10/17/2017   [x]  Aspirin 81 mg daily after 12 weeks  Current antihypertensives:  None      Baseline and surveillance labs (pulled in from [facility], refresh links as needed)       Lab Results  Component  Value  Date     PLT  288  04/25/2020     CREATININE  0.69  04/25/2020     AST  17  04/25/2020     ALT  18  04/25/2020     PROTCRRATIO  1.71 (H)  09/15/2017  LABPROT  14.9  02/20/2017        Antenatal Testing  CH   UTI (urinary tract infection)    Past Surgical History:  Procedure Laterality Date   CESAREAN SECTION N/A 09/16/2017   Procedure: CESAREAN SECTION;  Surgeon: [place] Bing, MD;  Location: [facility] BIRTHING SUITES;  Service: Obstetrics;  Laterality: N/A;   CESAREAN SECTION N/A 10/31/2020   Procedure: CESAREAN SECTION;  Surgeon: Adam Phenix, MD;  Location: [facility] LD ORS;  Service: Obstetrics;  Laterality: N/A;   OPEN REDUCTION INTERNAL FIXATION (ORIF) FOOT LISFRANC FRACTURE Left 03/10/2023   Procedure: LEFT FOOT OPEN TREATMENT OF LISFRANC JOINT, OPEN TREATMENT OF CUNEIFORM FRACTURES, OPEN TREATMENT OF FIRST TARSOMETATARSAL JOINT;  Surgeon: Terance Hart, MD;  Location: [facility] OR;  Service: Orthopedics;  Laterality: Left;   REPAIR  EXTENSOR TENDON WITH METATARSAL OSTEOTOMY AND OPEN REDUCTION IN Left 03/10/2023   Procedure: OPEN TREATMENT OF METATARSAL FRACTURES;  Surgeon: Terance Hart, MD;  Location: [facility] OR;  Service: Orthopedics;  Laterality: Left;   WISDOM TOOTH EXTRACTION     No Known Allergies    Objective:    Physical Exam Vitals and nursing note reviewed.  Constitutional:      Appearance: Normal appearance.  Cardiovascular:     Rate and Rhythm: Normal rate and regular rhythm.  Pulmonary:     Effort: Pulmonary effort is normal.     Breath sounds: Normal breath sounds.  Musculoskeletal:        General: Normal range of motion.  Skin:    General: Skin is warm and dry.  Neurological:     Mental Status: She is alert.  Psychiatric:        Mood and Affect: Mood normal.        Behavior: Behavior normal.    BP 122/85   Pulse 72   Ht 5\' 4"  (1.626 m)   Wt 245 lb (111.1 kg)   SpO2 98%   BMI 42.05 kg/m  Wt Readings from Last 3 Encounters:  07/07/23 245 lb (111.1 kg)  06/25/23 237 lb (107.5 kg)  06/24/23 237 lb 9.6 oz (107.8 kg)       Dulce Sellar, NP

## 2023-07-14 ENCOUNTER — Telehealth: Payer: Self-pay | Admitting: Family

## 2023-07-14 NOTE — Telephone Encounter (Signed)
Fax# for FMLA Forms is Fax# [phone] Patient requests FMLA Forms be faxed to Fax# listed above

## 2023-07-15 NOTE — Telephone Encounter (Signed)
Done

## 2023-07-27 NOTE — Telephone Encounter (Signed)
Patient states FMLA paperwork is showing as only pages 1 and 3 showing up. Can this be re-faxed?

## 2023-07-29 DIAGNOSIS — M79672 Pain in left foot: Secondary | ICD-10-CM | POA: Diagnosis not present

## 2023-08-01 NOTE — Telephone Encounter (Signed)
Faxed

## 2023-12-26 ENCOUNTER — Ambulatory Visit: Payer: Medicaid Other | Admitting: Family Medicine

## 2024-01-09 ENCOUNTER — Ambulatory Visit: Payer: Medicaid Other | Admitting: Family

## 2024-01-09 VITALS — BP 137/93 | HR 64 | Temp 98.2°F | Ht 64.0 in | Wt 246.0 lb

## 2024-01-09 DIAGNOSIS — I10 Essential (primary) hypertension: Secondary | ICD-10-CM | POA: Insufficient documentation

## 2024-01-09 DIAGNOSIS — Z6841 Body Mass Index (BMI) 40.0 and over, adult: Secondary | ICD-10-CM

## 2024-01-09 DIAGNOSIS — R635 Abnormal weight gain: Secondary | ICD-10-CM

## 2024-01-09 DIAGNOSIS — Z8041 Family history of malignant neoplasm of ovary: Secondary | ICD-10-CM | POA: Diagnosis not present

## 2024-01-09 MED ORDER — WEGOVY 0.25 MG/0.5ML ~~LOC~~ SOAJ: 0.2500 mg | SUBCUTANEOUS | 1 refills | Status: DC

## 2024-01-09 NOTE — Progress Notes (Signed)
Patient ID: Jennifer Lynn, female    DOB: July 18, 1996, 28 y.o.   MRN: [id number]  Chief Complaint  Patient presents with   abnormal weight gain    Pt c/o phentermine caused her to be jittery. Pt would like to discuss other weight loss injections.        Discussed the use of AI scribe software for clinical note transcription with the patient, who gave verbal consent to proceed.  History of Present Illness   The patient, with a history of foot surgery, presents with discomfort in the foot due to a nail left in place during the procedure. The patient reports that the foot is much better and she is able to wear normal shoes, although there is some residual swelling and discomfort, particularly when pressure is applied to the area where the nail is located. The patient has been told that the nail can be removed if the pain becomes too severe. In addition to the foot discomfort, the patient is struggling with weight loss. She has tried phentermine, but experienced jitteriness even with a half pill. The patient expresses a desire to lose weight, acknowledging that her current weight may be contributing to health issues such as elevated blood pressure. She expresses frustration with her current weight and a desire to return to a previous weight where she felt healthier.     Assessment & Plan:     Morbid Obesity - Patient experienced jitteriness with Phentermine. Discussed options for weight loss including Wegovy (semaglutide) and combination of Phentermine with Topiramate. Patient prefers to try [facility]. -Submit prior authorization for [facility] 0.25mg  weekly, sending med to pharmacy, pt advised on use and SE, demonstration of injection given in office. -Consider new referral to nutritionist for dietary counseling. -F/U in 1-2 mos  Hypertension - Elevated blood pressure noted during visit, pt has previously had elevated BP. Discussed potential link to current weight. Pt reluctant to start  medication. -Advised pt on weight loss, low sodium diet, increased water intake, and exercise to help lower BP. -F/U in 1-2 months.     Subjective:    Outpatient Medications Prior to Visit  Medication Sig Dispense Refill   naproxen (NAPROSYN) 500 MG tablet Take 1 tablet (500 mg total) by mouth 2 (two) times daily. 30 tablet 0   escitalopram (LEXAPRO) 5 MG tablet Take 1 tablet (5 mg total) by mouth daily. 30 tablet 1   No facility-administered medications prior to visit.   Past Medical History:  Diagnosis Date   Anemia    Anxiety    Blood transfusion without reported diagnosis    patient thinks she had after previous delivery   GERD (gastroesophageal reflux disease)    History of pre-eclampsia 10/17/2017   [x]  Aspirin 81 mg daily after 12 weeks  Current antihypertensives:  None      Baseline and surveillance labs (pulled in from [facility], refresh links as needed)       Lab Results  Component  Value  Date     PLT  288  04/25/2020     CREATININE  0.69  04/25/2020     AST  17  04/25/2020     ALT  18  04/25/2020     PROTCRRATIO  1.71 (H)  09/15/2017     LABPROT  14.9  02/20/2017        Antenatal Testing  CH   UTI (urinary tract infection)    Past Surgical History:  Procedure Laterality Date   CESAREAN SECTION N/A 09/16/2017  Procedure: CESAREAN SECTION;  Surgeon: [place] Bing, MD;  Location: [facility] BIRTHING SUITES;  Service: Obstetrics;  Laterality: N/A;   CESAREAN SECTION N/A 10/31/2020   Procedure: CESAREAN SECTION;  Surgeon: Adam Phenix, MD;  Location: [facility] LD ORS;  Service: Obstetrics;  Laterality: N/A;   OPEN REDUCTION INTERNAL FIXATION (ORIF) FOOT LISFRANC FRACTURE Left 03/10/2023   Procedure: LEFT FOOT OPEN TREATMENT OF LISFRANC JOINT, OPEN TREATMENT OF CUNEIFORM FRACTURES, OPEN TREATMENT OF FIRST TARSOMETATARSAL JOINT;  Surgeon: Terance Hart, MD;  Location: [facility] OR;  Service: Orthopedics;  Laterality: Left;   REPAIR EXTENSOR TENDON WITH METATARSAL OSTEOTOMY AND OPEN REDUCTION  IN Left 03/10/2023   Procedure: OPEN TREATMENT OF METATARSAL FRACTURES;  Surgeon: Terance Hart, MD;  Location: [facility] OR;  Service: Orthopedics;  Laterality: Left;   WISDOM TOOTH EXTRACTION     No Known Allergies    Objective:    Physical Exam Vitals and nursing note reviewed.  Constitutional:      Appearance: Normal appearance. She is morbidly obese.  Cardiovascular:     Rate and Rhythm: Normal rate and regular rhythm.  Pulmonary:     Effort: Pulmonary effort is normal.     Breath sounds: Normal breath sounds.  Musculoskeletal:        General: Normal range of motion.  Skin:    General: Skin is warm and dry.  Neurological:     Mental Status: She is alert.  Psychiatric:        Mood and Affect: Mood normal.        Behavior: Behavior normal.    BP (!) 137/93 (BP Location: Left Arm, Patient Position: Sitting, Cuff Size: Large)   Pulse 64   Temp 98.2 F (36.8 C) (Temporal)   Ht 5\' 4"  (1.626 m)   Wt 246 lb (111.6 kg)   LMP 12/31/2023 (Exact Date)   SpO2 97%   BMI 42.23 kg/m  Wt Readings from Last 3 Encounters:  01/09/24 246 lb (111.6 kg)  07/07/23 245 lb (111.1 kg)  06/25/23 237 lb (107.5 kg)     Dulce Sellar, NP

## 2024-01-11 ENCOUNTER — Telehealth: Payer: Self-pay

## 2024-01-11 ENCOUNTER — Other Ambulatory Visit (HOSPITAL_COMMUNITY): Payer: Self-pay

## 2024-01-11 NOTE — Telephone Encounter (Signed)
Pharmacy Patient Advocate Encounter   Received notification from CoverMyMeds that prior authorization for [facility] 0.25MG /0.5ML auto-injectors is required/requested.   Insurance verification completed.   The patient is insured through [facility] .   Per test claim: PA required; PA submitted to above mentioned insurance via CoverMyMeds Key/confirmation #/EOC [id number] Status is pending

## 2024-01-12 NOTE — Telephone Encounter (Signed)
Pharmacy Patient Advocate Encounter  Received notification from [facility] that Prior Authorization for Wegovy 0.25MG /0.5ML auto-injectors has been APPROVED from 01/10/2024 to 07/09/2024   PA #/Case ID/Reference #: [id number]

## 2024-01-31 ENCOUNTER — Telehealth: Payer: Self-pay

## 2024-01-31 NOTE — Telephone Encounter (Signed)
Copied from CRM [id number]. Topic: Clinical - Prescription Issue >> Jan 31, 2024  1:31 PM Truddie Crumble wrote: Reason for CRM: patient would like to be called in regards to her wegovy being approved and the pharmacy stating that it was still pending on their end   I called pharmacy, Pharmacist stated wegovy just wasn't in stock but it should be there tomorrow. I called pt and let her know of message above.

## 2024-02-16 ENCOUNTER — Emergency Department (HOSPITAL_BASED_OUTPATIENT_CLINIC_OR_DEPARTMENT_OTHER)
Admission: EM | Admit: 2024-02-16 | Discharge: 2024-02-16 | Disposition: A | Discharge status: Home / Self Care | Attending: Emergency Medicine | Admitting: Emergency Medicine

## 2024-02-16 ENCOUNTER — Ambulatory Visit: Payer: Self-pay | Admitting: Family

## 2024-02-16 ENCOUNTER — Encounter (HOSPITAL_BASED_OUTPATIENT_CLINIC_OR_DEPARTMENT_OTHER): Payer: Self-pay | Admitting: Emergency Medicine

## 2024-02-16 ENCOUNTER — Other Ambulatory Visit: Payer: Self-pay

## 2024-02-16 DIAGNOSIS — Y92410 Unspecified street and highway as the place of occurrence of the external cause: Secondary | ICD-10-CM | POA: Insufficient documentation

## 2024-02-16 DIAGNOSIS — I10 Essential (primary) hypertension: Secondary | ICD-10-CM | POA: Insufficient documentation

## 2024-02-16 DIAGNOSIS — S161XXA Strain of muscle, fascia and tendon at neck level, initial encounter: Secondary | ICD-10-CM | POA: Diagnosis not present

## 2024-02-16 DIAGNOSIS — S29012A Strain of muscle and tendon of back wall of thorax, initial encounter: Secondary | ICD-10-CM | POA: Diagnosis not present

## 2024-02-16 DIAGNOSIS — S199XXA Unspecified injury of neck, initial encounter: Secondary | ICD-10-CM | POA: Diagnosis present

## 2024-02-16 MED ORDER — METHOCARBAMOL 500 MG PO TABS: 500.0000 mg | ORAL_TABLET | Freq: Two times a day (BID) | ORAL | 0 refills | Status: DC

## 2024-02-16 MED ORDER — KETOROLAC TROMETHAMINE 15 MG/ML IJ SOLN
15.0000 mg | Freq: Once | INTRAMUSCULAR | Status: AC
  Administered 2024-02-16: 15 mg via INTRAMUSCULAR
  Filled 2024-02-16 (×2): qty 1

## 2024-02-16 MED ORDER — ACETAMINOPHEN 325 MG PO TABS
650.0000 mg | ORAL_TABLET | Freq: Once | ORAL | Status: AC
  Administered 2024-02-16: 650 mg via ORAL
  Filled 2024-02-16: qty 2

## 2024-02-16 NOTE — Telephone Encounter (Signed)
 Please see triage note on patient and advise if agreeable to see patient tomorrow instead of Monday
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
## 2024-02-16 NOTE — Telephone Encounter (Signed)
 Ok to see tomorrow as long as slot available, but let her know she can take 2 tabs (instead of 1 tab) of the Robaxin they gave her tid prn - just watch for drowsiness. She can also take up to 4 Ibuprofen tabs (after eating) tid prn & ok to take with Robaxin or alternate doses.

## 2024-02-16 NOTE — Discharge Instructions (Addendum)
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
## 2024-02-17 NOTE — Telephone Encounter (Signed)
 Spoke with pt advised recommendation from pcp pt verbalized  understand.

## 2024-02-20 ENCOUNTER — Ambulatory Visit

## 2024-02-20 ENCOUNTER — Ambulatory Visit (INDEPENDENT_AMBULATORY_CARE_PROVIDER_SITE_OTHER): Admitting: Family Medicine

## 2024-02-20 VITALS — BP 138/95 | HR 60 | Temp 97.3°F | Ht 64.0 in | Wt 237.4 lb

## 2024-02-20 DIAGNOSIS — M542 Cervicalgia: Secondary | ICD-10-CM | POA: Diagnosis not present

## 2024-02-20 DIAGNOSIS — M549 Dorsalgia, unspecified: Secondary | ICD-10-CM

## 2024-02-20 NOTE — Patient Instructions (Addendum)
 It was very nice to see you today!  We will get x-rays of your neck and your back.  It will probably be another week or 2 before you are back to normal.  You should be showing steady improvement for the next several days.  Please work on the exercises.  Return if symptoms worsen or fail to improve.   Take care, Dr Jimmey Ralph  PLEASE NOTE:  If you had any lab tests, please let us know if you have not heard back within a few days. You may see your results on mychart before we have a chance to review them but we will give you a call once they are reviewed by [place].   If we ordered any referrals today, please let us know if you have not heard from their office within the next week.   If you had any urgent prescriptions sent in today, please check with the pharmacy within an hour of our visit to make sure the prescription was transmitted appropriately.   Please try these tips to maintain a healthy lifestyle:  Eat at least 3 REAL meals and 1-2 snacks per day.  Aim for no more than 5 hours between eating.  If you eat breakfast, please do so within one hour of getting up.   Each meal should contain half fruits/vegetables, one quarter protein, and one quarter carbs (no bigger than a computer mouse)  Cut down on sweet beverages. This includes juice, soda, and sweet tea.   Drink at least 1 glass of water with each meal and aim for at least 8 glasses per day  Exercise at least 150 minutes every week.
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
## 2024-03-01 ENCOUNTER — Other Ambulatory Visit: Payer: Self-pay | Admitting: Family

## 2024-03-01 ENCOUNTER — Telehealth: Payer: Self-pay

## 2024-03-01 DIAGNOSIS — Z8041 Family history of malignant neoplasm of ovary: Secondary | ICD-10-CM

## 2024-03-01 DIAGNOSIS — I10 Essential (primary) hypertension: Secondary | ICD-10-CM

## 2024-03-01 DIAGNOSIS — R635 Abnormal weight gain: Secondary | ICD-10-CM

## 2024-03-01 DIAGNOSIS — G8929 Other chronic pain: Secondary | ICD-10-CM

## 2024-03-01 NOTE — Telephone Encounter (Signed)
 Copied from CRM [id number]. Topic: Clinical - Medication Refill >> Mar 01, 2024  2:25 PM Eunice Blase wrote: Most Recent Primary Care Visit:  Provider: Ardith Dark  Department: [facility]  Visit Type: ACUTE  Date: 02/20/2024  Medication: Semaglutide-Weight Management (WEGOVY) 0.25 MG/0.5ML SOAJ  Has the patient contacted their pharmacy? Yes (Agent: If no, request that the patient contact the pharmacy for the refill. If patient does not wish to contact the pharmacy document the reason why and proceed with request.) (Agent: If yes, when and what did the pharmacy advise?)Pharmacy need PCP approval  Is this the correct pharmacy for this prescription? Yes If no, delete pharmacy and type the correct one.  This is the patient's preferred pharmacy:      [id number] - [place], [place] - [address] AT [facility] [address] [place] [address] Phone: [phone] Fax: [phone]   Has the prescription been filled recently? Yes  Is the patient out of the medication? Yes  Has the patient been seen for an appointment in the last year OR does the patient have an upcoming appointment? Yes  Can we respond through MyChart? Yes  Agent: Please be advised that Rx refills may take up to 3 business days. We ask that you follow-up with your pharmacy.

## 2024-03-01 NOTE — Telephone Encounter (Signed)
 Copied from CRM [id number]. Topic: Clinical - Prescription Issue >> Mar 01, 2024  2:28 PM Eunice Blase wrote: Reason for CRM: Pt would like something called in for back pain. Please call pt at [phone]   Please advise
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
## 2024-03-02 NOTE — Telephone Encounter (Signed)
 Tylenol is not a RX, does she mean the RX strength Ibuprofen?
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
## 2024-03-02 NOTE — Telephone Encounter (Signed)
 Pt requesting refill for Wegovy, okay to increase dose to 0.5 mg? Pt is scheduled to see you on 03/06/2024 Tuesday.

## 2024-03-02 NOTE — Telephone Encounter (Signed)
yes, thx

## 2024-03-05 MED ORDER — IBUPROFEN 800 MG PO TABS: 800.0000 mg | ORAL_TABLET | Freq: Three times a day (TID) | ORAL | 1 refills | Status: DC | PRN

## 2024-03-05 NOTE — Addendum Note (Signed)
 Addended byDulce Sellar on: 03/05/2024 08:13 AM   Modules accepted: Orders

## 2024-03-05 NOTE — Telephone Encounter (Signed)
medsent

## 2024-03-06 ENCOUNTER — Ambulatory Visit: Admitting: Family

## 2024-03-06 ENCOUNTER — Encounter: Payer: Self-pay | Admitting: Family

## 2024-03-06 VITALS — BP 121/83 | HR 87 | Temp 98.2°F | Ht 64.0 in | Wt 241.8 lb

## 2024-03-06 DIAGNOSIS — K219 Gastro-esophageal reflux disease without esophagitis: Secondary | ICD-10-CM

## 2024-03-06 DIAGNOSIS — R635 Abnormal weight gain: Secondary | ICD-10-CM

## 2024-03-06 DIAGNOSIS — K5903 Drug induced constipation: Secondary | ICD-10-CM

## 2024-03-06 DIAGNOSIS — Z6841 Body Mass Index (BMI) 40.0 and over, adult: Secondary | ICD-10-CM

## 2024-03-06 MED ORDER — FAMOTIDINE 20 MG PO TABS: 20.0000 mg | ORAL_TABLET | Freq: Every day | ORAL | 2 refills | Status: DC

## 2024-03-06 MED ORDER — WEGOVY 0.5 MG/0.5ML ~~LOC~~ SOAJ
0.5000 mg | SUBCUTANEOUS | 1 refills | Status: DC
Start: 2024-03-06 — End: 2024-04-04

## 2024-03-06 MED ORDER — DOCUSATE SODIUM 100 MG PO CAPS: 100.0000 mg | ORAL_CAPSULE | Freq: Every day | ORAL | 2 refills | Status: DC

## 2024-03-06 NOTE — Assessment & Plan Note (Signed)
 Constipation likely due to [facility]. Emphasized hydration and dietary fiber intake. - Prescribe stool softener, start with one daily and increase to two if needed. - Encourage hydration with at least two liters of water daily. - Advise on dietary fiber intake through fruits, dark greens, and fiber pills/powder OTC. -F/U in 2 mos

## 2024-03-06 NOTE — Assessment & Plan Note (Signed)
 Heartburn exacerbated by starting GNFAOZ. Discussed temporary use of famotidine. Cautioned about ibuprofen increasing stomach acid. - Prescribe famotidine, start with one daily and increase to twice a day if symptoms persist. - Advise also limiting caffeine & other acidic foods due to risk of increased stomach acid. -Drink at least 2L water qd -F/U 2 mos

## 2024-03-06 NOTE — Progress Notes (Signed)
 Patient ID: Jennifer Lynn, female    DOB: 1996/02/02, 28 y.o.   MRN: [id number]  Chief Complaint  Patient presents with   Generalized anxiety disorder   Abnormal weight gain          Discussed the use of AI scribe software for clinical note transcription with the patient, who gave verbal consent to proceed.  History of Present Illness The patient, with a history of obesity, presents for a checkup after starting [facility]. She reports that the lowest dose of the medication did not result in any weight loss, but was well tolerated. She experienced an unexpected increase in appetite, which she found unusual. She also developed heartburn and constipation last week, which she attributes to the medication, but also has a hx of GERD. The patient is interested in increasing the dose of Wegovy in hopes of achieving weight loss. She also expresses a desire for increased energy levels, and is open to suggestions for achieving this.  Assessment & Plan Weight management - Wegovy at the lowest dose ineffective for appetite control and weight reduction. No significant side effects except constipation and heartburn. - Increase Wegovy dose to 0.5 mg weekly, advised on possible SE to look for. - Refer to nutritionist for dietary guidance. - Encourage regular exercise, including gym sessions and home workouts. - Advise reducing sugar intake to improve energy levels. -F/U in 2 mos  Constipation - Constipation likely due to [facility]. Emphasized hydration and dietary fiber intake. - Prescribe stool softener, start with one daily and increase to two if needed. - Encourage hydration with at least two liters of water daily. - Advise on dietary fiber intake through fruits, dark greens, and fiber pills/powder OTC. -F/U in 2 mos  GERD - Heartburn exacerbated by starting MVHQIO. Discussed temporary use of famotidine. Cautioned about ibuprofen increasing stomach acid. - Prescribe famotidine, start with one daily  and increase to twice a day if symptoms persist. - Advise also limiting caffeine & other acidic foods due to risk of increased stomach acid. -Drink at least 2L water qd -F/U 2 mos  General Health Maintenance - Recommended B complex vitamins for energy support. - Recommend B complex vitamins for energy support.  Follow-up Plan to monitor weight loss progress with increased Wegovy dose. Adjustments based on outcomes. - Follow up in two months to assess weight loss progress. - Adjust Wegovy dose if necessary based on weight loss outcomes.   Subjective:    Outpatient Medications Prior to Visit  Medication Sig Dispense Refill   ibuprofen (ADVIL) 800 MG tablet Take 1 tablet (800 mg total) by mouth every 8 (eight) hours as needed for mild pain (pain score 1-3) or moderate pain (pain score 4-6). 30 tablet 1   Semaglutide-Weight Management (WEGOVY) 0.25 MG/0.5ML SOAJ Inject 0.25 mg into the skin once a week. 2 mL 1   methocarbamol (ROBAXIN) 500 MG tablet Take 1 tablet (500 mg total) by mouth 2 (two) times daily. (Patient not taking: Reported on 03/06/2024) 20 tablet 0   No facility-administered medications prior to visit.   Past Medical History:  Diagnosis Date   Anemia    Anxiety    Blood transfusion without reported diagnosis    patient thinks she had after previous delivery   GERD (gastroesophageal reflux disease)    History of pre-eclampsia 10/17/2017   [x]  Aspirin 81 mg daily after 12 weeks  Current antihypertensives:  None      Baseline and surveillance labs (pulled in from [employer], refresh links  as needed)       Lab Results  Component  Value  Date     PLT  288  04/25/2020     CREATININE  0.69  04/25/2020     AST  17  04/25/2020     ALT  18  04/25/2020     PROTCRRATIO  1.71 (H)  09/15/2017     LABPROT  14.9  02/20/2017        Antenatal Testing  CH   UTI (urinary tract infection)    Past Surgical History:  Procedure Laterality Date   CESAREAN SECTION N/A 09/16/2017   Procedure:  CESAREAN SECTION;  Surgeon: River [address] Bing, MD;  Location: [facility] BIRTHING SUITES;  Service: Obstetrics;  Laterality: N/A;   CESAREAN SECTION N/A 10/31/2020   Procedure: CESAREAN SECTION;  Surgeon: Adam Phenix, MD;  Location: [facility] LD ORS;  Service: Obstetrics;  Laterality: N/A;   OPEN REDUCTION INTERNAL FIXATION (ORIF) FOOT LISFRANC FRACTURE Left 03/10/2023   Procedure: LEFT FOOT OPEN TREATMENT OF LISFRANC JOINT, OPEN TREATMENT OF CUNEIFORM FRACTURES, OPEN TREATMENT OF FIRST TARSOMETATARSAL JOINT;  Surgeon: Terance Hart, MD;  Location[facility] OR;  Service: Orthopedics;  Laterality: Left;   REPAIR EXTENSOR TENDON WITH METATARSAL OSTEOTOMY AND OPEN REDUCTION IN Left 03/10/2023   Procedure: OPEN TREATMENT OF METATARSAL FRACTURES;  Surgeon: Terance Hart, MD;  Location: [facility] OR;  Service: Orthopedics;  Laterality: Left;   WISDOM TOOTH EXTRACTION     Allergies  Allergen Reactions   Oxycodone Hives and Itching      Objective:    Physical Exam Vitals and nursing note reviewed.  Constitutional:      Appearance: Normal appearance. She is morbidly obese.  Cardiovascular:     Rate and Rhythm: Normal rate and regular rhythm.  Pulmonary:     Effort: Pulmonary effort is normal.     Breath sounds: Normal breath sounds.  Musculoskeletal:        General: Normal range of motion.  Skin:    General: Skin is warm and dry.  Neurological:     Mental Status: She is alert.  Psychiatric:        Mood and Affect: Mood normal.        Behavior: Behavior normal.    BP 121/83 (BP Location: Left Arm, Patient Position: Sitting, Cuff Size: Large)   Pulse 87   Temp 98.2 F (36.8 C) (Temporal)   Ht 5\' 4"  (1.626 m)   Wt 241 lb 12.8 oz (109.7 kg)   LMP 02/19/2024   SpO2 100%   BMI 41.50 kg/m  Wt Readings from Last 3 Encounters:  03/06/24 241 lb 12.8 oz (109.7 kg)  02/20/24 237 lb 6.4 oz (107.7 kg)  01/09/24 246 lb (111.6 kg)      Dulce Sellar, NP

## 2024-03-06 NOTE — Assessment & Plan Note (Signed)
 Wegovy at the lowest dose ineffective for appetite control and weight reduction. No significant side effects except constipation and heartburn. - Increase Wegovy dose to 0.5 mg weekly, advised on possible SE to look for. - Refer to nutritionist for dietary guidance. - Encourage regular exercise, including gym sessions and home workouts. - Advise reducing sugar intake to improve energy levels. -F/U in 2 mos

## 2024-03-08 ENCOUNTER — Encounter: Payer: Self-pay | Admitting: Family Medicine

## 2024-03-08 NOTE — Progress Notes (Signed)
 Her back and neck X-rays are normal with no signs of fracture.  She should let us know if pain is not improved.

## 2024-04-04 ENCOUNTER — Ambulatory Visit: Admitting: Family

## 2024-04-04 ENCOUNTER — Other Ambulatory Visit: Payer: Self-pay | Admitting: Family

## 2024-04-04 DIAGNOSIS — R635 Abnormal weight gain: Secondary | ICD-10-CM

## 2024-04-04 MED ORDER — WEGOVY 0.5 MG/0.5ML ~~LOC~~ SOAJ: 0.5000 mg | SUBCUTANEOUS | 1 refills | Status: DC

## 2024-04-04 NOTE — Telephone Encounter (Signed)
 Copied from CRM [id number]. Topic: Clinical - Medication Refill >> Apr 04, 2024  8:16 AM Dimple Francis wrote: Most Recent Primary Care Visit:  Provider: Versa Gore  Department: [facility]  Visit Type: OFFICE VISIT  Date: 03/06/2024  Medication: Semaglutide -Weight Management (WEGOVY ) 0.5 MG/0.5ML SOAJ  Has the patient contacted their pharmacy? Yes (Agent: If no, request that the patient contact the pharmacy for the refill. If patient does not wish to contact the pharmacy document the reason why and proceed with request.) (Agent: If yes, when and what did the pharmacy advise?)  Is this the correct pharmacy for this prescription? Yes If no, delete pharmacy and type the correct one.  This is the patient's preferred pharmacy:   [id number] - [place], [place] - [address] AT [facility] [address] [place] [address] Phone: [phone] Fax: [phone]   Has the prescription been filled recently? Yes  Is the patient out of the medication? Yes  Has the patient been seen for an appointment in the last year OR does the patient have an upcoming appointment? Yes  Can we respond through MyChart? Yes  Agent: Please be advised that Rx refills may take up to 3 business days. We ask that you follow-up with your pharmacy.

## 2024-04-05 ENCOUNTER — Encounter: Payer: Self-pay | Admitting: Family

## 2024-04-05 ENCOUNTER — Ambulatory Visit: Admitting: Family

## 2024-04-05 VITALS — BP 122/85 | HR 54 | Temp 97.5°F | Ht 64.0 in | Wt 230.2 lb

## 2024-04-05 DIAGNOSIS — Z6839 Body mass index (BMI) 39.0-39.9, adult: Secondary | ICD-10-CM

## 2024-04-05 DIAGNOSIS — R635 Abnormal weight gain: Secondary | ICD-10-CM

## 2024-04-05 MED ORDER — WEGOVY 1 MG/0.5ML ~~LOC~~ SOAJ
1.0000 mg | SUBCUTANEOUS | 1 refills | Status: DC
Start: 2024-04-05 — End: 2024-05-21

## 2024-04-05 NOTE — Progress Notes (Signed)
 Patient ID: CHARL WELLEN, female    DOB: [birth year], 28 y.o.   MRN: [id number]  Chief Complaint  Patient presents with   abnormal weight gain  Discussed the use of AI scribe software for clinical note transcription with the patient, who gave verbal consent to proceed.  History of Present Illness The patient, on a weight loss medication regimen, reports some weight loss and an increase in exercise frequency from twice to four times a week. She notes that she can see the weight loss in her clothes and believes she is losing inches. She initially started on a 0.25mg  dose of the medication, which she reports did not have any effect. The dose was increased to 0.5mg , and she has been on this dose for about three months. She reports no side effects from the medication, such as heartburn or constipation, and her bowel movements are regular. She also mentions running out of another medication she was due to take a few days prior to the appointment.  Assessment & Plan Obesity/Weight loss - On semaglutide  0.5 mg for 1 month and has had a 10lb weight loss, reports improved clothing fit. Increased exercise frequency to 4d/week. No significant side effects. Targeting 5% body weight reduction for Medicaid coverage. - Increase semaglutide  to 1 mg for two months, refill sent. - Advise balanced diet with two meals and snacks daily, focus on protein intake at night. - Encourage exercise, cardio and weights, at least 4-6 times weekly. - Schedule follow-up in two months to assess progress and adjust treatment.   Subjective:    Outpatient Medications Prior to Visit  Medication Sig Dispense Refill   docusate sodium  (STOOL SOFTENER) 100 MG capsule Take 1 capsule (100 mg total) by mouth daily. Increase to twice a day if needed to ensure a soft BM daily. 30 capsule 2   ibuprofen  (ADVIL ) 800 MG tablet Take 1 tablet (800 mg total) by mouth every 8 (eight) hours as needed for mild pain (pain score 1-3) or  moderate pain (pain score 4-6). 30 tablet 1   Semaglutide -Weight Management (WEGOVY ) 0.5 MG/0.5ML SOAJ Inject 0.5 mg into the skin once a week. 2 mL 1   famotidine  (PEPCID ) 20 MG tablet Take 1 tablet (20 mg total) by mouth daily. For indigestion or heartburn. Increase to twice a day if needed. (Patient not taking: Reported on 04/05/2024) 45 tablet 2   methocarbamol  (ROBAXIN ) 500 MG tablet Take 1 tablet (500 mg total) by mouth 2 (two) times daily. (Patient not taking: Reported on 04/05/2024) 20 tablet 0   No facility-administered medications prior to visit.   Past Medical History:  Diagnosis Date   Anemia    Anxiety    Blood transfusion without reported diagnosis    patient thinks she had after previous delivery   GERD (gastroesophageal reflux disease)    History of pre-eclampsia 10/17/2017   [x]  Aspirin  81 mg daily after 12 weeks  Current antihypertensives:  None      Baseline and surveillance labs (pulled in from [facility], refresh links as needed)       Lab Results  Component  Value  Date     PLT  288  04/25/2020     CREATININE  0.69  04/25/2020     AST  17  04/25/2020     ALT  18  04/25/2020     PROTCRRATIO  1.71 (H)  09/15/2017     LABPROT  14.9  02/20/2017        Antenatal Testing  CH   UTI (urinary tract infection)    Past Surgical History:  Procedure Laterality Date   CESAREAN SECTION N/A 09/16/2017   Procedure: CESAREAN SECTION;  Surgeon: Raynell Caller, MD;  Location: [facility] BIRTHING SUITES;  Service: Obstetrics;  Laterality: N/A;   CESAREAN SECTION N/A 10/31/2020   Procedure: CESAREAN SECTION;  Surgeon: Tresia Fruit, MD;  Location: [facility] LD ORS;  Service: Obstetrics;  Laterality: N/A;   OPEN REDUCTION INTERNAL FIXATION (ORIF) FOOT LISFRANC FRACTURE Left 03/10/2023   Procedure: LEFT FOOT OPEN TREATMENT OF LISFRANC JOINT, OPEN TREATMENT OF CUNEIFORM FRACTURES, OPEN TREATMENT OF FIRST TARSOMETATARSAL JOINT;  Surgeon: Donnamarie Gables, MD;  Location: [facility] OR;  Service: Orthopedics;   Laterality: Left;   REPAIR EXTENSOR TENDON WITH METATARSAL OSTEOTOMY AND OPEN REDUCTION IN Left 03/10/2023   Procedure: OPEN TREATMENT OF METATARSAL FRACTURES;  Surgeon: Donnamarie Gables, MD;  Location: [facility] OR;  Service: Orthopedics;  Laterality: Left;   WISDOM TOOTH EXTRACTION     Allergies  Allergen Reactions   Oxycodone  Hives and Itching      Objective:    Physical Exam Vitals and nursing note reviewed.  Constitutional:      Appearance: Normal appearance. She is obese.  Cardiovascular:     Rate and Rhythm: Normal rate and regular rhythm.  Pulmonary:     Effort: Pulmonary effort is normal.     Breath sounds: Normal breath sounds.  Musculoskeletal:        General: Normal range of motion.  Skin:    General: Skin is warm and dry.  Neurological:     Mental Status: She is alert.  Psychiatric:        Mood and Affect: Mood normal.        Behavior: Behavior normal.    BP 122/85 (BP Location: Left Arm, Patient Position: Sitting, Cuff Size: Large)   Pulse (!) 54   Temp (!) 97.5 F (36.4 C) (Temporal)   Ht 5\' 4"  (1.626 m)   Wt 230 lb 3.2 oz (104.4 kg)   LMP 04/02/2024 (Exact Date)   SpO2 (!) 52%   BMI 39.51 kg/m  Wt Readings from Last 3 Encounters:  04/05/24 230 lb 3.2 oz (104.4 kg)  03/06/24 241 lb 12.8 oz (109.7 kg)  02/20/24 237 lb 6.4 oz (107.7 kg)      Versa Gore, NP

## 2024-04-05 NOTE — Assessment & Plan Note (Signed)
 On semaglutide  0.5 mg for 1 month and has had a 10lb weight loss, reports improved clothing fit. Increased exercise frequency to 4d/week. No significant side effects. Targeting 5% body weight reduction for Medicaid coverage. - Increase semaglutide  to 1 mg for two months, refill sent. - Advise balanced diet with two meals and snacks daily, focus on protein intake at night. - Encourage exercise, cardio and weights, at least 4-6 times weekly. - Schedule follow-up in two months to assess progress and adjust treatment.

## 2024-05-21 ENCOUNTER — Encounter: Payer: Self-pay | Admitting: Family

## 2024-05-21 ENCOUNTER — Ambulatory Visit: Admitting: Family

## 2024-05-21 VITALS — BP 110/82 | HR 55 | Temp 98.2°F | Ht 65.0 in | Wt 216.8 lb

## 2024-05-21 DIAGNOSIS — Z113 Encounter for screening for infections with a predominantly sexual mode of transmission: Secondary | ICD-10-CM | POA: Diagnosis not present

## 2024-05-21 DIAGNOSIS — R635 Abnormal weight gain: Secondary | ICD-10-CM

## 2024-05-21 DIAGNOSIS — K5903 Drug induced constipation: Secondary | ICD-10-CM

## 2024-05-21 DIAGNOSIS — N3090 Cystitis, unspecified without hematuria: Secondary | ICD-10-CM | POA: Diagnosis not present

## 2024-05-21 LAB — POCT URINALYSIS DIP (CLINITEK)
Bilirubin, UA: NEGATIVE
Blood, UA: NEGATIVE
Glucose, UA: NEGATIVE mg/dL
Nitrite, UA: NEGATIVE
POC PROTEIN,UA: NEGATIVE
Spec Grav, UA: >=1.03 — AB (ref 1.010–1.025)
Urobilinogen, UA: 0.2 U/dL
pH, UA: 6 (ref 5.0–8.0)

## 2024-05-21 MED ORDER — SULFAMETHOXAZOLE-TRIMETHOPRIM 800-160 MG PO TABS: 1.0000 | ORAL_TABLET | Freq: Two times a day (BID) | ORAL | 0 refills | Status: DC

## 2024-05-21 MED ORDER — DOCUSATE SODIUM 100 MG PO CAPS: 100.0000 mg | ORAL_CAPSULE | Freq: Two times a day (BID) | ORAL | 2 refills | Status: DC

## 2024-05-21 MED ORDER — WEGOVY 1 MG/0.5ML ~~LOC~~ SOAJ: 1.0000 mg | SUBCUTANEOUS | 1 refills | Status: DC

## 2024-05-21 NOTE — Assessment & Plan Note (Signed)
 Chronic constipation likely exacerbated by Ozempic and insufficient hydration. Limited success with stool softeners and laxatives. Discussed hydration's role in bowel movements and potential pain from persistent constipation. - Increase stool softener to twice daily. - Encourage water intake to at least two liters per day. - Advised sipping room temperature water in small amounts throughout the day. - Instruct to finish magnesium citrate in thirds if needed. - Consider prune juice mixed with apple and cranberry juice as a natural laxative. - Call the office if sx are not improved

## 2024-05-21 NOTE — Progress Notes (Signed)
 Patient ID: Jennifer Lynn, female    DOB: [birth year], 30 y.o.   MRN: [id number]  Chief Complaint  Patient presents with   Vaginal Bleeding    States when she is having sex there is bleeding. States she wasn't having sex for a while then started up again. Extremely heavy bleeding. Wants urine ran for UTI. Asking for STD testing. Got root canal they prescribed amoxicillin  isnt sure if that could have something to do with this.    Obesity  Discussed the use of AI scribe software for clinical note transcription with the patient, who gave verbal consent to proceed.  History of Present Illness Jennifer Lynn is a 28 year old female who presents for follow-up regarding weight management and constipation issues.  She is undergoing weight management treatment with Ozempic, resulting in significant weight loss. She is satisfied with her progress and is attending the gym to improve muscle tone, though results are slower than expected. She experiences constipation and cannot recall her last bowel movement. Daily stool softeners, an over-the-counter laxative, and small amounts of magnesium citrate have been ineffective. She acknowledges inadequate water intake, often feeling full and sometimes vomiting after drinking cold water. She reports vaginal bleeding during intercourse, which she has not experienced since October 2024 until last week. Bleeding occurs during intercourse and is noticeable when wiping but is not heavy. There is no pain or dryness during intercourse. She is not using condoms. She has not noticed any cloudiness or darkness in her urine but mentions some urinary urgency and frequency. She primarily drinks water and fruit juices, including apple, cranberry, and pineapple juice.  Assessment & Plan Constipation Chronic constipation likely exacerbated by Ozempic and insufficient hydration. Limited success with stool softeners and laxatives. Discussed hydration's role in bowel movements  and potential pain from persistent constipation. - Increase stool softener to twice daily. - Encourage water intake to at least two liters per day. - Advised sipping room temperature water in small amounts throughout the day. - Instruct to finish magnesium citrate in thirds if needed. - Consider prune juice mixed with apple and cranberry juice as a natural laxative. - Call the office if sx are not improved  Weight management - Significant weight loss on current Ozempic dose, down 14lbs in1.5 mos. Prefer not to increase dose due to satisfactory progress and concerns about rapid weight loss and side effects. Emphasized addressing constipation before dose adjustment. - Continue current dose of Ozempic 1mg  qweek, sending refill. - Schedule follow-up in August to reassess weight management and medication dosage.  Vaginal bleeding during intercourse Bleeding likely due to friction and mucosal irritation, no pain or dryness. Discussed benefits of lubricants to minimize irritation. - Recommend use of vaginal lubricant during intercourse. - Provide samples of vaginal lubricant. - Check urine for STIs - Discuss alternative lubricants such as KY jelly or coconut oil, with caution for potential allergies.  Cystitis - UA positive for mild infection. Advised on increased water intake. Unable to leave another specimen for culture & STD testing. Will bring in sample tomorrow. - Sending Bactrim, advised on use & SE - Drop off urine tomorrow for culture - Call office if sx are not improved after finishing abt   Subjective:     Outpatient Medications Prior to Visit  Medication Sig Dispense Refill   Semaglutide -Weight Management (WEGOVY ) 1 MG/0.5ML SOAJ Inject 1 mg into the skin once a week. 2 mL 1   docusate sodium  (STOOL SOFTENER) 100 MG capsule Take 1 capsule (  100 mg total) by mouth daily. Increase to twice a day if needed to ensure a soft BM daily. (Patient not taking: Reported on 05/21/2024) 30  capsule 2   ibuprofen  (ADVIL ) 800 MG tablet Take 1 tablet (800 mg total) by mouth every 8 (eight) hours as needed for mild pain (pain score 1-3) or moderate pain (pain score 4-6). (Patient not taking: Reported on 05/21/2024) 30 tablet 1   No facility-administered medications prior to visit.   Past Medical History:  Diagnosis Date   Anemia    Anxiety    Blood transfusion without reported diagnosis    patient thinks she had after previous delivery   GERD (gastroesophageal reflux disease)    History of pre-eclampsia 10/17/2017   [x]  Aspirin  81 mg daily after 12 weeks  Current antihypertensives:  None      Baseline and surveillance labs (pulled in from [facility], refresh links as needed)       Lab Results  Component  Value  Date     PLT  288  04/25/2020     CREATININE  0.69  04/25/2020     AST  17  04/25/2020     ALT  18  04/25/2020     PROTCRRATIO  1.71 (H)  09/15/2017     LABPROT  14.9  02/20/2017        Antenatal Testing  CH   UTI (urinary tract infection)    Past Surgical History:  Procedure Laterality Date   CESAREAN SECTION N/A 09/16/2017   Procedure: CESAREAN SECTION;  Surgeon: Raynell Caller, MD;  Location: [facility] BIRTHING SUITES;  Service: Obstetrics;  Laterality: N/A;   CESAREAN SECTION N/A 10/31/2020   Procedure: CESAREAN SECTION;  Surgeon: Tresia Fruit, MD;  Location: [facility] LD ORS;  Service: Obstetrics;  Laterality: N/A;   OPEN REDUCTION INTERNAL FIXATION (ORIF) FOOT LISFRANC FRACTURE Left 03/10/2023   Procedure: LEFT FOOT OPEN TREATMENT OF LISFRANC JOINT, OPEN TREATMENT OF CUNEIFORM FRACTURES, OPEN TREATMENT OF FIRST TARSOMETATARSAL JOINT;  Surgeon: Donnamarie Gables, MD;  Location: [facility] OR;  Service: Orthopedics;  Laterality: Left;   REPAIR EXTENSOR TENDON WITH METATARSAL OSTEOTOMY AND OPEN REDUCTION IN Left 03/10/2023   Procedure: OPEN TREATMENT OF METATARSAL FRACTURES;  Surgeon: Donnamarie Gables, MD;  Location: [facility] OR;  Service: Orthopedics;  Laterality: Left;   WISDOM TOOTH EXTRACTION      Allergies  Allergen Reactions   Oxycodone  Hives and Itching      Objective:    Physical Exam Vitals and nursing note reviewed.  Constitutional:      Appearance: Normal appearance. She is obese.  Cardiovascular:     Rate and Rhythm: Normal rate and regular rhythm.  Pulmonary:     Effort: Pulmonary effort is normal.     Breath sounds: Normal breath sounds.  Musculoskeletal:        General: Normal range of motion.  Skin:    General: Skin is warm and dry.  Neurological:     Mental Status: She is alert.  Psychiatric:        Mood and Affect: Mood normal.        Behavior: Behavior normal.    BP 110/82   Pulse (!) 55   Temp 98.2 F (36.8 C) (Temporal)   Ht 5\' 5"  (1.651 m)   Wt 216 lb 12.8 oz (98.3 kg)   SpO2 98%   BMI 36.08 kg/m  Wt Readings from Last 3 Encounters:  05/21/24 216 lb 12.8 oz (98.3 kg)  04/05/24 230 lb  3.2 oz (104.4 kg)  03/06/24 241 lb 12.8 oz (109.7 kg)      Versa Gore, NP

## 2024-05-21 NOTE — Assessment & Plan Note (Signed)
 Significant weight loss on current Ozempic dose, down 14lbs in1.5 mos. Prefer not to increase dose due to satisfactory progress and concerns about rapid weight loss and side effects. Emphasized addressing constipation before dose adjustment. - Continue current dose of Ozempic 1mg  qweek, sending refill. - Schedule follow-up in August to reassess weight management and medication dosage.

## 2024-06-20 ENCOUNTER — Telehealth: Payer: Self-pay

## 2024-06-20 ENCOUNTER — Ambulatory Visit: Admitting: Family

## 2024-06-20 NOTE — Telephone Encounter (Signed)
 Spoke to patient and she is going to cancel appointment and head to [facility] for IV. Patient is going to drop off another Urine sample tomorrow to check for UTI. She said she never got anything for it last time she was here.

## 2024-06-20 NOTE — Telephone Encounter (Signed)
 Noted

## 2024-07-18 ENCOUNTER — Ambulatory Visit (INDEPENDENT_AMBULATORY_CARE_PROVIDER_SITE_OTHER): Admitting: Family

## 2024-07-18 ENCOUNTER — Encounter: Payer: Self-pay | Admitting: Family

## 2024-07-18 ENCOUNTER — Other Ambulatory Visit (HOSPITAL_COMMUNITY)
Admission: RE | Admit: 2024-07-18 | Discharge: 2024-07-18 | Disposition: A | Discharge status: Home / Self Care | Source: Ambulatory Visit | Attending: Family | Admitting: Family

## 2024-07-18 VITALS — BP 122/82 | HR 60 | Temp 97.9°F | Ht 65.0 in | Wt 219.6 lb

## 2024-07-18 DIAGNOSIS — R300 Dysuria: Secondary | ICD-10-CM | POA: Diagnosis not present

## 2024-07-18 DIAGNOSIS — N3001 Acute cystitis with hematuria: Secondary | ICD-10-CM | POA: Diagnosis not present

## 2024-07-18 DIAGNOSIS — R635 Abnormal weight gain: Secondary | ICD-10-CM | POA: Diagnosis not present

## 2024-07-18 DIAGNOSIS — Z113 Encounter for screening for infections with a predominantly sexual mode of transmission: Secondary | ICD-10-CM

## 2024-07-18 DIAGNOSIS — N939 Abnormal uterine and vaginal bleeding, unspecified: Secondary | ICD-10-CM

## 2024-07-18 LAB — POCT URINALYSIS DIPSTICK
Bilirubin, UA: NEGATIVE
Blood, UA: POSITIVE — AB
Glucose, UA: NEGATIVE
Ketones, UA: NEGATIVE
Nitrite, UA: NEGATIVE
Protein, UA: POSITIVE — AB
Spec Grav, UA: >=1.03 — AB (ref 1.010–1.025)
Urobilinogen, UA: 0.2 U/dL
pH, UA: 6 (ref 5.0–8.0)

## 2024-07-18 MED ORDER — WEGOVY 1.7 MG/0.75ML ~~LOC~~ SOAJ: 1.7000 mg | SUBCUTANEOUS | 1 refills | Status: DC

## 2024-07-18 NOTE — Progress Notes (Unsigned)
 Patient ID: Jennifer Lynn, female    DOB: [birth year], 27 y.o.   MRN: [id number]  Chief Complaint  Patient presents with   Dysuria    Pt c/o dysuria, present for 3 days.  HPI: Jennifer Lynn is having UTI symptoms with foul odor and dysuria, frequency/urgency for about 3 days. Denies any cloudiness, some hematuria, no back or pelvic pain. She was treated for UTI in June, but states she did not take the Bactrim  prescribed at that time. She is undergoing weight management treatment with Wegovy , and has been on 1mg  dose for 2 months with initial weight loss, but reports last week having sugar cravings again and no appetite suppression. She is still attending the gym to improve muscle tone.  She experiences constipation some, but daily stool softener is effective. She is doing better with water intake, but often feeling full and sometimes vomiting after drinking. States she has to add flavor to the water as gets tired of drinking so much. She also reports continued vaginal bleeding during intercourse, but not every time. Bleeding occurs during intercourse and is noticeable when wiping but is not heavy. There is no pain or dryness during intercourse. She is not using condoms.   ASSESSMENT & PLAN: UTI sending  -     POCT urinalysis dipstick -     Urine cytology ancillary only -     Urine Culture  Vaginal bleeding, abnormal intermittently after intercourse. Denies vaginal dryness, does not use condoms.  -     Ambulatory referral to Gynecology  Morbid obesity (HCC)  Abnormal weight gain Assessment & Plan: Stalled weight loss on current wegovy , previously down 14lbs in1.5 mos. Last 3 mos on current 1mg  dose, but no longer feels effects of medication. Wanting to increase dose today. - Increase dose to 1.7mg  qweek, sending refill. - Continue to advise on SE and wt loss strategies. - Schedule follow-up in 2 months to reassess weight management and medication dosage.  Orders: -     Wegovy ;  Inject 1.7 mg into the skin once a week.  Dispense: 3 mL; Refill: 1  Routine screening for STI (sexually transmitted infection) -     Urine cytology ancillary only   Subjective:    Outpatient Medications Prior to Visit  Medication Sig Dispense Refill   docusate sodium  (STOOL SOFTENER) 100 MG capsule Take 1 capsule (100 mg total) by mouth 2 (two) times daily. MUST drink at least 2 liters of water daily for this to work! 60 capsule 2   Semaglutide -Weight Management (WEGOVY ) 1 MG/0.5ML SOAJ Inject 1 mg into the skin once a week. 2 mL 1   ibuprofen  (ADVIL ) 800 MG tablet Take 1 tablet (800 mg total) by mouth every 8 (eight) hours as needed for mild pain (pain score 1-3) or moderate pain (pain score 4-6). (Patient not taking: Reported on 07/18/2024) 30 tablet 1   sulfamethoxazole -trimethoprim  (BACTRIM  DS) 800-160 MG tablet Take 1 tablet by mouth 2 (two) times daily after a meal. (Patient not taking: Reported on 07/18/2024) 6 tablet 0   No facility-administered medications prior to visit.   Past Medical History:  Diagnosis Date   Anemia    Anxiety    Blood transfusion without reported diagnosis    patient thinks she had after previous delivery   GERD (gastroesophageal reflux disease)    History of pre-eclampsia 10/17/2017   [x]  Aspirin  81 mg daily after 12 weeks  Current antihypertensives:  None      Baseline and surveillance labs (pulled  in from [facility], refresh links as needed)       Lab Results  Component  Value  Date     PLT  288  04/25/2020     CREATININE  0.69  04/25/2020     AST  17  04/25/2020     ALT  18  04/25/2020     PROTCRRATIO  1.71 (H)  09/15/2017     LABPROT  14.9  02/20/2017        Antenatal Testing  CH   UTI (urinary tract infection)    Past Surgical History:  Procedure Laterality Date   CESAREAN SECTION N/A 09/16/2017   Procedure: CESAREAN SECTION;  Surgeon: Izell Harari, MD;  Location: [facility] BIRTHING SUITES;  Service: Obstetrics;  Laterality: N/A;   CESAREAN SECTION N/A  10/31/2020   Procedure: CESAREAN SECTION;  Surgeon: Eveline Lynwood MATSU, MD;  Location: [facility] LD ORS;  Service: Obstetrics;  Laterality: N/A;   OPEN REDUCTION INTERNAL FIXATION (ORIF) FOOT LISFRANC FRACTURE Left 03/10/2023   Procedure: LEFT FOOT OPEN TREATMENT OF LISFRANC JOINT, OPEN TREATMENT OF CUNEIFORM FRACTURES, OPEN TREATMENT OF FIRST TARSOMETATARSAL JOINT;  Surgeon: Elsa Lonni SAUNDERS, MD;  Location: [facility] OR;  Service: Orthopedics;  Laterality: Left;   REPAIR EXTENSOR TENDON WITH METATARSAL OSTEOTOMY AND OPEN REDUCTION IN Left 03/10/2023   Procedure: OPEN TREATMENT OF METATARSAL FRACTURES;  Surgeon: Elsa Lonni SAUNDERS, MD;  Location: [facility] OR;  Service: Orthopedics;  Laterality: Left;   WISDOM TOOTH EXTRACTION     Allergies  Allergen Reactions   Oxycodone  Hives and Itching      Objective:    Physical Exam Vitals and nursing note reviewed.  Constitutional:      Appearance: Normal appearance. She is obese.  Cardiovascular:     Rate and Rhythm: Normal rate and regular rhythm.  Pulmonary:     Effort: Pulmonary effort is normal.     Breath sounds: Normal breath sounds.  Musculoskeletal:        General: Normal range of motion.  Skin:    General: Skin is warm and dry.  Neurological:     Mental Status: She is alert.  Psychiatric:        Mood and Affect: Mood normal.        Behavior: Behavior normal.    BP 122/82 (BP Location: Left Arm, Patient Position: Sitting, Cuff Size: Large)   Pulse 60   Temp 97.9 F (36.6 C) (Temporal)   Ht 5' 5 (1.651 m)   Wt 219 lb 9.6 oz (99.6 kg)   LMP 07/17/2024 (Exact Date)   SpO2 99%   BMI 36.54 kg/m  Wt Readings from Last 3 Encounters:  07/18/24 219 lb 9.6 oz (99.6 kg)  05/21/24 216 lb 12.8 oz (98.3 kg)  04/05/24 230 lb 3.2 oz (104.4 kg)       Lucius Krabbe, NP

## 2024-07-19 ENCOUNTER — Telehealth: Payer: Self-pay

## 2024-07-19 MED ORDER — NITROFURANTOIN MONOHYD MACRO 100 MG PO CAPS: 100.0000 mg | ORAL_CAPSULE | Freq: Two times a day (BID) | ORAL | 0 refills | Status: DC

## 2024-07-19 NOTE — Telephone Encounter (Signed)
-----   Message from [place] sent at 07/19/2024  8:10 AM EDT ----- Regarding: antibiotic forgot to send yesterday for her UTI - has been sent this am. Please let her know, thx.

## 2024-07-19 NOTE — Telephone Encounter (Signed)
 MyChart message sent to pt

## 2024-07-19 NOTE — Assessment & Plan Note (Signed)
 Stalled weight loss on current wegovy , previously down 14lbs in1.5 mos. Last 3 mos on current 1mg  dose, but no longer feels effects of medication. Wanting to increase dose today. - Increase dose to 1.7mg  qweek, sending refill. - Continue to advise on SE and wt loss strategies. - Schedule follow-up in 2 months to reassess weight management and medication dosage.

## 2024-07-20 LAB — URINE CYTOLOGY ANCILLARY ONLY
Chlamydia: NEGATIVE
Comment: NEGATIVE
Comment: NEGATIVE
Comment: NORMAL
Neisseria Gonorrhea: NEGATIVE
Trichomonas: NEGATIVE

## 2024-07-20 LAB — URINE CULTURE
MICRO NUMBER:: 16794946
SPECIMEN QUALITY:: ADEQUATE

## 2024-07-24 ENCOUNTER — Ambulatory Visit: Payer: Self-pay | Admitting: Family

## 2024-07-24 NOTE — Progress Notes (Signed)
 Urine culture positive and Macrobid  should treat her infection, hopefully feeling better. Thx

## 2024-08-21 ENCOUNTER — Telehealth: Payer: Self-pay

## 2024-08-21 NOTE — Telephone Encounter (Signed)
 I returned pt's call and left a voicemail letting pt know, I have not received any PA request from pt's pharmacy. I have sent a message over to our PA team to start a PA for Wegovy .   Copied from CRM [id number]. Topic: Clinical - Prescription Issue >> Aug 21, 2024  7:59 AM Mercedes MATSU wrote: Reason for CRM: Patient called in upset stating that the pharmacy sent over 3 prior authorization requests to the clinic and the nurse is not responding to them. The patient states if a different medication other than Wegovy  needs to be sent that find, but she would like to know what is the delay. Patient is very upset and requesting a call back for a update and can be reached at [phone]. Patient stated that if her medication prior auth is not handled or if no one contacts her she will come up there she did not say to do what.

## 2024-08-22 ENCOUNTER — Telehealth: Payer: Self-pay

## 2024-08-22 ENCOUNTER — Other Ambulatory Visit (HOSPITAL_COMMUNITY): Payer: Self-pay

## 2024-08-22 NOTE — Telephone Encounter (Signed)
 Pharmacy Patient Advocate Encounter  Received notification from [employer] that Prior Authorization for  WEGOVY  1.7MG /0.75ML AUTO-INJECTORS  has been DENIED.  Full denial letter will be uploaded to the media tab. See denial reason below.    -The documentation on office visit from 07/18/24 was not sufficient. We need updated documentation per insurance request highlighted above.   PA #/Case ID/Reference #: [id number]

## 2024-08-22 NOTE — Telephone Encounter (Signed)
 Pharmacy Patient Advocate Encounter   Received notification from Physician's Office that prior authorization for WEGOVY  1.7MG /0.75ML AUTO-INJECTORS is required/requested.   Insurance verification completed.   The patient is insured through [employer] .   Per test claim: PA required; PA submitted to above mentioned insurance via Latent Key/confirmation #/EOC [id number] Status is pending

## 2024-08-30 ENCOUNTER — Ambulatory Visit: Admitting: Family

## 2024-08-30 ENCOUNTER — Encounter: Payer: Self-pay | Admitting: Family

## 2024-08-30 ENCOUNTER — Other Ambulatory Visit (HOSPITAL_COMMUNITY)
Admission: RE | Admit: 2024-08-30 | Discharge: 2024-08-30 | Disposition: A | Discharge status: Home / Self Care | Source: Ambulatory Visit | Attending: Family | Admitting: Family

## 2024-08-30 ENCOUNTER — Telehealth: Payer: Self-pay

## 2024-08-30 VITALS — BP 109/73 | HR 64 | Temp 98.1°F | Ht 65.0 in | Wt 221.8 lb

## 2024-08-30 DIAGNOSIS — E6609 Other obesity due to excess calories: Secondary | ICD-10-CM | POA: Diagnosis not present

## 2024-08-30 DIAGNOSIS — Z6836 Body mass index (BMI) 36.0-36.9, adult: Secondary | ICD-10-CM | POA: Diagnosis not present

## 2024-08-30 DIAGNOSIS — E66812 Obesity, class 2: Secondary | ICD-10-CM

## 2024-08-30 DIAGNOSIS — R635 Abnormal weight gain: Secondary | ICD-10-CM

## 2024-08-30 DIAGNOSIS — N898 Other specified noninflammatory disorders of vagina: Secondary | ICD-10-CM

## 2024-08-30 MED ORDER — WEGOVY 1 MG/0.5ML ~~LOC~~ SOAJ: 1.0000 mg | SUBCUTANEOUS | 1 refills | Status: DC

## 2024-08-30 NOTE — Telephone Encounter (Signed)
 Pharmacy Patient Advocate Encounter   Received notification from Physician's Office that prior authorization for Wegovy  1.7MG /0.75ML auto-injectors  is required/requested.   Insurance verification completed.   The patient is insured through [employer] .   Per test claim: PA required; PA submitted to above mentioned insurance via Latent Key/confirmation #/EOC [id number] Status is pending   Per test claim: Effective October 1st, [place] Medicaid will no longer cover GLP1s for obesity

## 2024-08-30 NOTE — Progress Notes (Signed)
 Patient ID: Jennifer Lynn, female    DOB: 12-Mar-1996, 28 y.o.   MRN: [id number]  Chief Complaint  Patient presents with   Weight Loss    F/u  Discussed the use of AI scribe software for clinical note transcription with the patient, who gave verbal consent to proceed.  History of Present Illness   Jennifer Lynn is a 28 year old female who presents with issues related to weight management and medication coverage.  Weight management difficulties - Difficulty managing weight without medication, with increased cravings and perceived insufficient results from diet and exercise alone - Current dietary regimen includes three major meals and two snacks daily, avoidance of breads, cessation of food intake by 7 PM, and high water intake - Attempts regular exercise but feels efforts are inadequate - Previous participation in other weight management programs such as [facility], but found them cost-prohibitive  Anti-obesity pharmacotherapy issues - Recently taking Wegovy  1mg , initiated in March - Dose increased to 1.7mg  over a month ago, but she has been delayed in picking up due to insurance requiring a new PA - Experiencing frustration with insurance coverage and timing of medication access - No nausea with Wegovy , but has experienced constipation  Gynecologic symptoms - Concerns about possible bacterial vaginosis or other etiology for post-coital bleeding and swelling - Attributes symptoms to possible swollen cervix     Assessment and Plan    Class 2 Obesity with abnormal weight gain Weight management challenging without Wegovy . Insurance issues prevent current use, leading to increased cravings. - Resubmit prior authorization for Wegovy  at 1mg . -  Wegovy  0.25mg  samples for interim use, advised on how to take and titrate up to 1mg  dose. - Refer to [facility] program. - Discussed $400/month payment plan for Wegovy  direct through the manufacturer, pt to consider. -  She is currently on a structured nutrition plan and agrees to continue to follow her current lifestyle modifications including eating low-carb diet, avoiding white carbs & sweets, portion control, reduced daily caloric intake, intermittent fasting, regular cardio exercise at the gym including weight lifting to maintain muscle mass at least 5 days per week.  Postcoital vaginal odor Postcoital odor similar to past BV infections under evaluation, possible bacterial vaginosis. - Perform vaginal swab for bacterial vaginosis or other infections.   Subjective:    Outpatient Medications Prior to Visit  Medication Sig Dispense Refill   semaglutide -weight management (WEGOVY ) 1.7 MG/0.75ML SOAJ SQ injection Inject 1.7 mg into the skin once a week. (Patient not taking: Reported on 08/30/2024) 3 mL 1   docusate sodium  (STOOL SOFTENER) 100 MG capsule Take 1 capsule (100 mg total) by mouth 2 (two) times daily. MUST drink at least 2 liters of water daily for this to work! 60 capsule 2   ibuprofen  (ADVIL ) 800 MG tablet Take 1 tablet (800 mg total) by mouth every 8 (eight) hours as needed for mild pain (pain score 1-3) or moderate pain (pain score 4-6). (Patient not taking: Reported on 07/18/2024) 30 tablet 1   nitrofurantoin , macrocrystal-monohydrate, (MACROBID ) 100 MG capsule Take 1 capsule (100 mg total) by mouth 2 (two) times daily after a meal. 10 capsule 0   No facility-administered medications prior to visit.   Past Medical History:  Diagnosis Date   Anemia    Anxiety    Blood transfusion without reported diagnosis    patient thinks she had after previous delivery   GERD (gastroesophageal reflux disease)    History of pre-eclampsia 10/17/2017   [  x] Aspirin  81 mg daily after 12 weeks  Current antihypertensives:  None      Baseline and surveillance labs (pulled in from [facility], refresh links as needed)       Lab Results  Component  Value  Date     PLT  288  04/25/2020     CREATININE  0.69  04/25/2020     AST   17  04/25/2020     ALT  18  04/25/2020     PROTCRRATIO  1.71 (H)  09/15/2017     LABPROT  14.9  02/20/2017        Antenatal Testing  CH   UTI (urinary tract infection)    Past Surgical History:  Procedure Laterality Date   CESAREAN SECTION N/A 09/16/2017   Procedure: CESAREAN SECTION;  Surgeon: Izell Harari, MD;  Location: [facility] BIRTHING SU[facility]ES;  Service: Obstetrics;  Laterality: N/A;   CESAREAN SECTION N/A 10/31/2020   Procedure: CESAREAN SECTION;  Surgeon: Eveline Lynwood MATSU, MD;  Location: [facility] LD ORS;  Service: Obstetrics;  Laterality: N/A;   OPEN REDUCTION INTERNAL FIXATION (ORIF) FOOT LISFRANC FRACTURE Left 03/10/2023   Procedure: LEFT FOOT OPEN TREATMENT OF LISFRANC JOINT, OPEN TREATMENT OF CUNEIFORM FRACTURES, OPEN TREATMENT OF FIRST TARSOMETATARSAL JOINT;  Surgeon: Elsa Lonni SAUNDERS, MD;  Location: [facility] OR;  Service: Orthopedics;  Laterality: Left;   REPAIR EXTENSOR TENDON WITH METATARSAL OSTEOTOMY AND OPEN REDUCTION IN Left 03/10/2023   Procedure: OPEN TREATMENT OF METATARSAL FRACTURES;  Surgeon: Elsa Lonni SAUNDERS, MD;  Location: [facility] OR;  Service: Orthopedics;  Laterality: Left;   WISDOM TOOTH EXTRACTION     Allergies  Allergen Reactions   Oxycodone  Hives and Itching      Objective:    Physical Exam Vitals and nursing note reviewed.  Constitutional:      Appearance: Normal appearance. She is obese.  Cardiovascular:     Rate and Rhythm: Normal rate and regular rhythm.  Pulmonary:     Effort: Pulmonary effort is normal.     Breath sounds: Normal breath sounds.  Musculoskeletal:        General: Normal range of motion.  Skin:    General: Skin is warm and dry.  Neurological:     Mental Status: She is alert.  Psychiatric:        Mood and Affect: Mood normal.        Behavior: Behavior normal.    BP 109/73 (BP Location: Left Arm, Patient Position: Sitting, Cuff Size: Large)   Pulse 64   Temp 98.1 F (36.7 C) (Temporal)   Ht 5' 5 (1.651 m)   Wt 221 lb 12.8 oz (100.6 kg)    LMP 08/09/2024 (Exact Date)   SpO2 98%   BMI 36.91 kg/m  Wt Readings from Last 3 Encounters:  08/30/24 221 lb 12.8 oz (100.6 kg)  07/18/24 219 lb 9.6 oz (99.6 kg)  05/21/24 216 lb 12.8 oz (98.3 kg)      Lucius Krabbe, NP

## 2024-08-30 NOTE — Telephone Encounter (Signed)
-----   Message from Lucius Krabbe sent at 08/30/2024  3:53 PM EDT ----- Regarding: Wegovy  please resubmit wegovy  PA, the specific verbage has been added to the note, pt seen in office today. She was also given samples to restart lower dose since it has been over a month since last taken, instructions give on how to titrate dose up and 1mg  dose sent in for approval. In case they ask why the 1mg  dose, thanks!

## 2024-08-31 ENCOUNTER — Other Ambulatory Visit (HOSPITAL_COMMUNITY): Payer: Self-pay

## 2024-08-31 NOTE — Telephone Encounter (Signed)
 Pharmacy Patient Advocate Encounter  Received notification from [employer] that Prior Authorization for Wegovy  1.7MG /0.75ML auto-injectors has been APPROVED from 08/30/24 to 08/30/25. Ran test claim, Copay is $4. This test claim was processed through [facility]- copay amounts may vary at other pharmacies due to pharmacy/plan contracts, or as the patient moves through the different stages of their insurance plan.   PA #/Case ID/Reference #: [id number]     Effective October 1st, [place] [employer] will no longer cover GLP1s for obesity

## 2024-08-31 NOTE — Telephone Encounter (Signed)
 FYI. I reached out to pt in regards to Approval. I advised pt to contact pharmacy, Pt verbalized understanding.

## 2024-09-03 ENCOUNTER — Telehealth: Payer: Self-pay

## 2024-09-03 LAB — CERVICOVAGINAL ANCILLARY ONLY
Bacterial Vaginitis (gardnerella): POSITIVE — AB
Candida Glabrata: NEGATIVE
Candida Vaginitis: NEGATIVE
Chlamydia: NEGATIVE
Comment: NEGATIVE
Comment: NEGATIVE
Comment: NEGATIVE
Comment: NEGATIVE
Comment: NEGATIVE
Comment: NORMAL
Neisseria Gonorrhea: NEGATIVE
Trichomonas: NEGATIVE

## 2024-09-03 NOTE — Telephone Encounter (Signed)
 Copied from CRM [id number]. Topic: Clinical - Medication Question >> Sep 03, 2024  4:06 PM Martinique E wrote: Reason for CRM: Patient was able to view her lab results from 9/18 and she is questioning if her PCP would be able to prescribe her a medication.

## 2024-09-04 ENCOUNTER — Ambulatory Visit: Payer: Self-pay | Admitting: Family

## 2024-09-04 DIAGNOSIS — B9689 Other specified bacterial agents as the cause of diseases classified elsewhere: Secondary | ICD-10-CM

## 2024-09-04 DIAGNOSIS — N898 Other specified noninflammatory disorders of vagina: Secondary | ICD-10-CM

## 2024-09-04 MED ORDER — METRONIDAZOLE 0.75 % VA GEL: 1.0000 | Freq: Every day | VAGINAL | 0 refills | Status: DC

## 2024-09-04 MED ORDER — METRONIDAZOLE 500 MG PO TABS: 500.0000 mg | ORAL_TABLET | Freq: Two times a day (BID) | ORAL | 0 refills | Status: DC

## 2024-09-04 NOTE — Telephone Encounter (Signed)
 sent mychart message & antibiotic sent to pharmacy.

## 2024-09-18 ENCOUNTER — Encounter (INDEPENDENT_AMBULATORY_CARE_PROVIDER_SITE_OTHER): Payer: Self-pay

## 2024-09-20 ENCOUNTER — Encounter: Payer: Self-pay | Admitting: Obstetrics and Gynecology

## 2024-09-20 ENCOUNTER — Ambulatory Visit: Admitting: Obstetrics and Gynecology

## 2024-09-20 ENCOUNTER — Other Ambulatory Visit: Payer: Self-pay

## 2024-09-20 VITALS — BP 116/79 | HR 74 | Wt 206.0 lb

## 2024-09-20 DIAGNOSIS — Z1331 Encounter for screening for depression: Secondary | ICD-10-CM

## 2024-09-20 DIAGNOSIS — N939 Abnormal uterine and vaginal bleeding, unspecified: Secondary | ICD-10-CM

## 2024-09-20 DIAGNOSIS — N946 Dysmenorrhea, unspecified: Secondary | ICD-10-CM

## 2024-09-20 NOTE — Progress Notes (Signed)
 GYNECOLOGY VISIT  Patient name: Jennifer Lynn MRN [id number]  Date of birth: 07-14-1996 Chief Complaint:   Pelvic Pain  History:  Discussed the use of AI scribe software for clinical note transcription with the patient, who gave verbal consent to proceed.  History of Present Illness Jennifer Lynn is a 28 year old female who presents with painful periods and concerns about fibroids and endometriosis.  She has experienced dysmenorrhea since menarche at age 71, with worsening symptoms post-pregnancy. Her menstrual cycle is every 21 days, with periods lasting five days, though every other cycle is shorter at three days, accompanied by significant pain radiating to her thighs and back, which began after her first cesarean section. She has a history of cramps managed without medication, though ibuprofen  was prescribed in the past.  She is concerned about fibroids due to a family history of fibroids and hysterectomies. Her periods involve significant clotting, raising concerns about fertility as she desires more children. She is not currently taking medication for her periods and prefers to avoid medication unless necessary for a specific condition like endometriosis.  She experienced a recent episode of postcoital bleeding, diagnosed as bacterial vaginosis, treated with metronidazole  and a gel. Her current period started 12 days early, which is atypical for her. She does not experience nocturnal bleeding but sometimes passes large clots when using the bathroom, which is concerning to her.  She is on Wegovy  for weight management but reports no significant changes in her menstrual cycle related to her weight. No significant changes in weight, bowel habits, or other symptoms between periods.     The following portions of the patient's history were reviewed and updated as appropriate: allergies, current medications, past family history, past medical history, past social history, past  surgical history and problem list.   Health Maintenance:   Last pap     Component Value Date/Time   DIAGPAP  04/08/2023 1006    - Negative for intraepithelial lesion or malignancy (NILM)   DIAGPAP  04/25/2020 0933    - Negative for intraepithelial lesion or malignancy (NILM)   DIAGPAP  02/17/2017 0000    NEGATIVE FOR INTRAEPITHELIAL LESIONS OR MALIGNANCY.   ADEQPAP  04/08/2023 1006    Satisfactory for evaluation; transformation zone component PRESENT.   ADEQPAP  04/25/2020 0933    Satisfactory for evaluation; transformation zone component PRESENT.   ADEQPAP  02/17/2017 0000    Satisfactory for evaluation  endocervical/transformation zone component PRESENT.    Health Maintenance  Topic Date Due   HPV Vaccine (2 - 3-dose series) 02/19/2025*   Flu Shot  03/12/2025*   COVID-19 Vaccine (1 - 2025-26 season) 07/03/2025*   Hepatitis B Vaccine (1 of 3 - 19+ 3-dose series) 07/18/2025*   Pap Smear  04/07/2026   DTaP/Tdap/Td vaccine (3 - Td or Tdap) 06/27/2031   Hepatitis C Screening  Completed   HIV Screening  Completed   Pneumococcal Vaccine  Aged Out   Meningitis B Vaccine  Aged Out  *Topic was postponed. The date shown is not the original due date.      Review of Systems:  Pertinent items are noted in HPI. Comprehensive review of systems was otherwise negative.   Objective:  Physical Exam BP 116/79   Pulse 74   Wt 206 lb (93.4 kg)   LMP 09/20/2024 (Exact Date)   BMI 34.28 kg/m    Physical Exam Vitals and nursing note reviewed.  Constitutional:      Appearance: Normal appearance.  HENT:  Head: Normocephalic and atraumatic.  Pulmonary:     Effort: Pulmonary effort is normal.  Skin:    General: Skin is warm and dry.  Neurological:     General: No focal deficit present.     Mental Status: She is alert.  Psychiatric:        Mood and Affect: Mood normal.        Behavior: Behavior normal.        Thought Content: Thought content normal.        Judgment: Judgment  normal.        Assessment & Plan:   Assessment & Plan Dysmenorrhea and abnormal uterine bleeding evaluation Chronic dysmenorrhea with potential fibroids, endometriosis, adenomyosis, or primary dysmenorrhea. Fertility preservation desired. Ultrasound planned for fibroid evaluation. Endometriosis diagnosis requires surgery. Fibroids and endometriosis may affect quality of life and reproductive goals. - Order transabdominal and transvaginal ultrasound to evaluate for fibroids. - Follow up in one month to discuss ultrasound results and further management options. - Reviewed that need for intervention based on symptoms and reproductive goals - Not currently taking anything for pain and not currently trying to conceive - Reviewed that both diagnoses are on a spectrum and neither necessitates a hysterectomy - Can complete examination at follow up and assess cervix as part of workup of postcoital bleeding  - further review management options when US  completed  Bacterial vaginosis, recently treated Recently treated with metronidazole  and gel. Atypical early menstruation possibly related to infection or stress. - Monitor for recurrence of bacterial vaginosis symptoms after menstruation.   Carter Quarry, MD [facility], [facility]

## 2024-09-27 ENCOUNTER — Ambulatory Visit (HOSPITAL_BASED_OUTPATIENT_CLINIC_OR_DEPARTMENT_OTHER): Admission: RE | Admit: 2024-09-27

## 2024-09-27 ENCOUNTER — Ambulatory Visit (HOSPITAL_BASED_OUTPATIENT_CLINIC_OR_DEPARTMENT_OTHER)

## 2024-10-18 ENCOUNTER — Ambulatory Visit: Admitting: Obstetrics and Gynecology

## 2024-10-22 ENCOUNTER — Ambulatory Visit: Admitting: Family

## 2024-10-22 ENCOUNTER — Encounter

## 2024-11-21 ENCOUNTER — Other Ambulatory Visit: Payer: Self-pay | Admitting: Family

## 2024-11-21 ENCOUNTER — Other Ambulatory Visit: Payer: Self-pay

## 2024-11-21 ENCOUNTER — Telehealth: Payer: Self-pay

## 2024-11-21 DIAGNOSIS — R635 Abnormal weight gain: Secondary | ICD-10-CM

## 2024-11-21 DIAGNOSIS — E6609 Other obesity due to excess calories: Secondary | ICD-10-CM

## 2024-11-21 MED ORDER — WEGOVY 1 MG/0.5ML ~~LOC~~ SOAJ: 1.0000 mg | SUBCUTANEOUS | 1 refills | Status: DC

## 2024-11-21 NOTE — Telephone Encounter (Signed)
 Copied from CRM #[id number]. Topic: Clinical - Prescription Issue >> Nov 21, 2024  2:00 PM Jennifer Lynn wrote: Reason for CRM: Patient called in stating that she was prescribed WEGOVY  in September. Since then she has not been able to get her prescription,she has just been using samples that she got from the office.She went to try and get the prescription today and she stated that the pharmacy [facility] said that they do not have a script for her for the Wegovy .   RX sent to pharmacy.

## 2024-11-26 ENCOUNTER — Institutional Professional Consult (permissible substitution): Admitting: Family Medicine

## 2024-12-17 ENCOUNTER — Telehealth: Payer: Self-pay

## 2024-12-17 NOTE — Telephone Encounter (Signed)
 Copied from CRM [id number]. Topic: Clinical - Medication Question >> Dec 17, 2024 11:23 AM Hadassah PARAS wrote: Reason for CRM: Wegovy  no longer covered by insurance. Pt is req a generic form of the medication or to be sent to a pharacy where pt can afford med. Please advise pt on #[id number]

## 2024-12-18 ENCOUNTER — Telehealth: Payer: Self-pay

## 2024-12-18 ENCOUNTER — Encounter: Payer: Self-pay | Admitting: Family

## 2024-12-18 ENCOUNTER — Other Ambulatory Visit (HOSPITAL_COMMUNITY): Payer: Self-pay

## 2024-12-18 ENCOUNTER — Other Ambulatory Visit: Payer: Self-pay

## 2024-12-18 DIAGNOSIS — R635 Abnormal weight gain: Secondary | ICD-10-CM

## 2024-12-18 DIAGNOSIS — E66812 Obesity, class 2: Secondary | ICD-10-CM

## 2024-12-18 MED ORDER — WEGOVY 1 MG/0.5ML ~~LOC~~ SOAJ: 1.0000 mg | SUBCUTANEOUS | 1 refills | Status: DC

## 2024-12-18 NOTE — Telephone Encounter (Signed)
 Copied from CRM [id number]. Topic: Clinical - Medication Question >> Dec 17, 2024 11:23 AM Hadassah PARAS wrote: Reason for CRM: Wegovy  no longer covered by insurance. Pt is req a generic form of the medication or to be sent to a pharacy where pt can afford med. Please advise pt on #[id number] >> Dec 18, 2024 12:52 PM Emylou G wrote: Victoria from [facility] called.. wants us  to rerun script through the pharmacy ( 1.75 mg 0.75 ml ) so they can approve it.   Reviewed and discussed with patient.

## 2024-12-18 NOTE — Telephone Encounter (Signed)
 Patient would like to know if she's eligible for the Wegovy  obesity pill. Patient will be reaching out to her insurance to see if this is covered. Advised patient to sign up on the wegovy  website and RX Wegovy  0.25 injectable will be sent after signing up, patient verbalized understanding.

## 2024-12-18 NOTE — Telephone Encounter (Signed)
 there is no generic. we can send directly to the company that makes it and they ship it to her for $199 per month, then $349 for higher doses.

## 2024-12-18 NOTE — Telephone Encounter (Signed)
 Pharmacy Patient Advocate Encounter   Received notification from Patient Advice Request messages that prior authorization for Wegovy  1MG /0.5ML auto-injectors is required/requested.   Insurance verification completed.   The patient is insured through [employer].   Per test claim: PA required; PA submitted to above mentioned insurance via Latent Key/confirmation #/[id number] Status is pending

## 2024-12-18 NOTE — Telephone Encounter (Signed)
 Patient came in office requesting clarification. States she was informed by insurance company that all other medication prescriptions have to be canceled and a new prescription has to be sent in so they can apply a new prior authorization. Patient would like to know what occurred following her approval on 08/30/24. Please advise.

## 2024-12-19 ENCOUNTER — Other Ambulatory Visit: Payer: Self-pay | Admitting: Family

## 2024-12-19 ENCOUNTER — Other Ambulatory Visit: Payer: Self-pay

## 2024-12-19 DIAGNOSIS — R635 Abnormal weight gain: Secondary | ICD-10-CM

## 2024-12-19 DIAGNOSIS — E66812 Obesity, class 2: Secondary | ICD-10-CM

## 2024-12-19 MED ORDER — WEGOVY 1 MG/0.5ML ~~LOC~~ SOAJ: 1.0000 mg | SUBCUTANEOUS | 1 refills | Status: DC

## 2024-12-19 NOTE — Telephone Encounter (Signed)
 Separate message sent to PCP

## 2024-12-19 NOTE — Telephone Encounter (Signed)
 Hi Jennifer Lynn!  So we just got word that Medicaid is covering the GLP-1 meds again? not sure if you're able to pull up this link....  [email or web address]

## 2024-12-19 NOTE — Telephone Encounter (Signed)
 yes, she would be eligible, again, they are offering a cash price as well, just need to go to the website to get all the info, thx.

## 2024-12-19 NOTE — Telephone Encounter (Signed)
 It is not recommended to start a higher dose when she has been off of the medication for more than a few weeks as it will cause nausea. Can send the 1.7mg  dose - confirm pharmacy & she will have to take Zofran  4mg  q8h prn - 20 pills no RF- recommend taking the night of her shot and the next 2 mornings to offset and she has to still try to eat small bites and hydrate to avoid more nausea and other side effects.

## 2024-12-19 NOTE — Telephone Encounter (Signed)
 Pharmacy Patient Advocate Encounter  Received notification from [employer] that Prior Authorization for Wegovy  1MG /0.5ML auto-injectors  has been DENIED.  Full denial letter will be uploaded to the media tab. See denial reason below.   PA #/Case ID/Reference #: [id number]

## 2024-12-19 NOTE — Telephone Encounter (Signed)
 I don't have [facility] in her list, is this the same as [facility]?

## 2024-12-19 NOTE — Telephone Encounter (Signed)
 oops - you can't pull up that link from teams - here is the link I got, just can't click on it sorry!

## 2024-12-20 ENCOUNTER — Other Ambulatory Visit: Payer: Self-pay

## 2024-12-20 ENCOUNTER — Other Ambulatory Visit: Payer: Self-pay | Admitting: Family

## 2024-12-20 DIAGNOSIS — R110 Nausea: Secondary | ICD-10-CM

## 2024-12-20 MED ORDER — WEGOVY 1.7 MG/0.75ML ~~LOC~~ SOAJ: 1.7000 mg | SUBCUTANEOUS | 1 refills | Status: DC

## 2024-12-20 MED ORDER — ONDANSETRON HCL 4 MG PO TABS: 4.0000 mg | ORAL_TABLET | Freq: Three times a day (TID) | ORAL | 0 refills | Status: AC | PRN

## 2024-12-20 NOTE — Telephone Encounter (Signed)
 Patient called in and expressed that she is very upset due to the Pa not being completed for the 1.75 mg as insurance required. Patient wants 1 mg sent in along with explanation of previous attempts for the medications failing. Patient stated that she refuses to do the 1.7 as she wants the 1.75 insurance will cover. Patient expressed that she wanted to speak to provider directly to ensure a urgent PA to have the correct medication and dosage sent in. Patient scheduled a video visit to speak with provider on this coming Monday at 8:00am. Please advise.

## 2024-12-20 NOTE — Telephone Encounter (Signed)
 Called and left a message for patient to call the office back to review stephanie's note.

## 2024-12-20 NOTE — Telephone Encounter (Signed)
 The last Wegovy  RX was sent to [facility], is that correct? all other [facility]

## 2024-12-20 NOTE — Addendum Note (Signed)
 Addended by: NEYSA CLARIA SAILOR on: 12/20/2024 04:16 PM   Modules accepted: Orders

## 2024-12-20 NOTE — Telephone Encounter (Signed)
 Please start PA for wegovy  1.7 mg for patient, urgent.

## 2024-12-21 ENCOUNTER — Other Ambulatory Visit: Payer: Self-pay

## 2024-12-21 ENCOUNTER — Other Ambulatory Visit: Payer: Self-pay | Admitting: Family

## 2024-12-21 ENCOUNTER — Other Ambulatory Visit (HOSPITAL_COMMUNITY): Payer: Self-pay

## 2024-12-21 DIAGNOSIS — R635 Abnormal weight gain: Secondary | ICD-10-CM

## 2024-12-21 MED ORDER — WEGOVY 1.7 MG/0.75ML ~~LOC~~ SOAJ: 1.7000 mg | SUBCUTANEOUS | 1 refills | Status: DC

## 2024-12-21 NOTE — Telephone Encounter (Signed)
 Last read by Marybeth JINNY Beal at 5:48PM on 12/20/2024.

## 2024-12-24 ENCOUNTER — Other Ambulatory Visit (HOSPITAL_COMMUNITY): Payer: Self-pay

## 2024-12-24 ENCOUNTER — Telehealth: Payer: Self-pay

## 2024-12-24 ENCOUNTER — Encounter: Payer: Self-pay | Admitting: Family

## 2024-12-24 ENCOUNTER — Telehealth: Admitting: Family

## 2024-12-24 VITALS — Wt 230.0 lb

## 2024-12-24 DIAGNOSIS — E669 Obesity, unspecified: Secondary | ICD-10-CM | POA: Diagnosis not present

## 2024-12-24 DIAGNOSIS — R635 Abnormal weight gain: Secondary | ICD-10-CM

## 2024-12-24 DIAGNOSIS — Z6838 Body mass index (BMI) 38.0-38.9, adult: Secondary | ICD-10-CM

## 2024-12-24 MED ORDER — WEGOVY 0.5 MG/0.5ML ~~LOC~~ SOAJ: 0.5000 mg | SUBCUTANEOUS | 1 refills | Status: DC

## 2024-12-24 NOTE — Progress Notes (Signed)
 "   MyChart Video Visit    Virtual Visit via Video Note   This format is felt to be most appropriate for this patient at this time. Physical exam was limited by quality of the video and audio technology used for the visit. CMA was able to get the patient set up on a video visit.  Patient location: Home. Patient and provider in visit Provider location: Office  I discussed the limitations of evaluation and management by telemedicine and the availability of in person appointments. The patient expressed understanding and agreed to proceed.  Visit Date: 12/24/2024  Today's healthcare provider: Lucius Krabbe, NP     Subjective:   Patient ID: Jennifer Lynn, female    DOB: 06/19/1996, 29 y.o.   MRN: [id number]  Chief Complaint  Patient presents with   Abnormal weight gain    Pt would like yo discuss wegovy .  Discussed the use of AI scribe software for clinical note transcription with the patient, who gave verbal consent to proceed.  History of Present Illness Jennifer Lynn is a 28 year old female who presents with issues regarding her Wegovy  prescription and insurance preauthorization.  She has had insurance preauthorization problems for Wegovy  since September, mainly related to which dose should be authorized. She prefers her prescription be sent to [facility] instead of [facility]. She titrated Wegovy  from 0.25 mg (no effect) to 0.5 mg (noticed benefit) and then to 1 mg, which she tolerated well. She has not had nausea or other intolerable side effects with any dose so far.  Assessment & Plan Morbid obesity Management with Wegovy  complicated by insurance coverage, non-coverage &  preauthorization. Previous doses of 0.25 mg and 0.5 mg ineffective; 1 mg effective without nausea. She has not taken any dose of Wegovy  since 08/2024. Current preauthorization for 1.7 mg, but she prefers to restart at 0.5 mg to avoid nausea since not on med for over 3 months. Current weight is 230lbs.  She is committed to continuing her lifestyle changes including: physical exercise 4-5 days per week working out doing light weight training 2-3 days per week and cardio exercise up to 30 minutes 6-7 days per week; eating 4-5 mini-meals per day consisting mostly of lean protein and veges, avoiding processed foods, trying to reduce carb intake to less than 60g per day; hydrating with at least 2.5 liters of caffeine free beverages daily.  - Instructed her to communicate through MyChart for updates. - Scheduled follow-up visit one month after starting Wegovy  0.5 mg. - Coordinated with pharmacy to ensure medication availability post-approval.   Past Medical History:  Diagnosis Date   Anemia    Anxiety    Blood transfusion without reported diagnosis    patient thinks she had after previous delivery   GERD (gastroesophageal reflux disease)    History of pre-eclampsia 10/17/2017   [x]  Aspirin  81 mg daily after 12 weeks  Current antihypertensives:  None      Baseline and surveillance labs (pulled in from [facility], refresh links as needed)       Lab Results  Component  Value  Date     PLT  288  04/25/2020     CREATININE  0.69  04/25/2020     AST  17  04/25/2020     ALT  18  04/25/2020     PROTCRRATIO  1.71 (H)  09/15/2017     LABPROT  14.9  02/20/2017        Antenatal Testing  CH   UTI (  urinary tract infection)     Past Surgical History:  Procedure Laterality Date   CESAREAN SECTION N/A 09/16/2017   Procedure: CESAREAN SECTION;  Surgeon: Izell Harari, MD;  Location: [facility] BIRTHING SU[facility]ES;  Service: Obstetrics;  Laterality: N/A;   CESAREAN SECTION N/A 10/31/2020   Procedure: CESAREAN SECTION;  Surgeon: Eveline Lynwood MATSU, MD;  Location: [facility] LD ORS;  Service: Obstetrics;  Laterality: N/A;   OPEN REDUCTION INTERNAL FIXATION (ORIF) FOOT LISFRANC FRACTURE Left 03/10/2023   Procedure: LEFT FOOT OPEN TREATMENT OF LISFRANC JOINT, OPEN TREATMENT OF CUNEIFORM FRACTURES, OPEN TREATMENT OF FIRST TARSOMETATARSAL JOINT;   Surgeon: Elsa Lonni SAUNDERS, MD;  Location: [facility] OR;  Service: Orthopedics;  Laterality: Left;   REPAIR EXTENSOR TENDON WITH METATARSAL OSTEOTOMY AND OPEN REDUCTION IN Left 03/10/2023   Procedure: OPEN TREATMENT OF METATARSAL FRACTURES;  Surgeon: Elsa Lonni SAUNDERS, MD;  Location: [facility] OR;  Service: Orthopedics;  Laterality: Left;   WISDOM TOOTH EXTRACTION      Outpatient Medications Prior to Visit  Medication Sig Dispense Refill   ondansetron  (ZOFRAN ) 4 MG tablet Take 1 tablet (4 mg total) by mouth every 8 (eight) hours as needed for nausea or vomiting. 30 tablet 0   metroNIDAZOLE  (METROGEL ) 0.75 % vaginal gel Place 1 Applicatorful vaginally at bedtime. (Patient not taking: Reported on 09/20/2024) 70 g 0   semaglutide -weight management (WEGOVY ) 1.7 MG/0.75ML SOAJ SQ injection Inject 1.7 mg into the skin once a week. 3 mL 1   No facility-administered medications prior to visit.    Allergies[1]     Objective:   Physical Exam Vitals and nursing note reviewed.  Constitutional:      General: Pt is not in acute distress.    Appearance: Normal appearance.  HENT:     Head: Normocephalic.  Pulmonary:     Effort: No respiratory distress.  Musculoskeletal:     Cervical back: Normal range of motion.  Skin:    General: Skin is dry.     Coloration: Skin is not pale.  Neurological:     Mental Status: Pt is alert and oriented to person, place, and time.  Psychiatric:        Mood and Affect: Mood normal.   Wt 230 lb (104.3 kg)   LMP  (LMP Unknown)   BMI 38.27 kg/m   Wt Readings from Last 3 Encounters:  12/27/24 230 lb (104.3 kg)  09/20/24 206 lb (93.4 kg)  08/30/24 221 lb 12.8 oz (100.6 kg)      I discussed the assessment and treatment plan with the patient. The patient was provided an opportunity to ask questions and all were answered. The patient agreed with the plan and demonstrated an understanding of the instructions.   The patient was advised to call back or seek an in-person  evaluation if the symptoms worsen or if the condition fails to improve as anticipated.  Lucius Krabbe, NP [facility] [phone] (phone) [phone] (fax)  [facility]      [1]  Allergies Allergen Reactions   Oxycodone  Hives and Itching   "

## 2024-12-24 NOTE — Telephone Encounter (Signed)
 Reviewed

## 2024-12-24 NOTE — Telephone Encounter (Signed)
 Pharmacy Patient Advocate Encounter   Received notification from Physician's Office that prior authorization for Wegovy  0.5MG /0.5ML auto-injectors is required/requested.   Insurance verification completed.   The patient is insured through [employer].   Per test claim: PA required; PA submitted to above mentioned insurance via Latent Key/confirmation #/[id number] Status is pending   *Resubmission per dr request.

## 2024-12-25 NOTE — Telephone Encounter (Signed)
 Pharmacy Patient Advocate Encounter  Received notification from [employer] that Prior Authorization for Wegovy  0.5MG /0.5ML auto-injectors has been DENIED.  Full denial letter will be uploaded to the media tab. See denial reason below.   PA #/Case ID/Reference #: [id number]

## 2024-12-26 ENCOUNTER — Encounter: Payer: Self-pay | Admitting: Family

## 2024-12-26 NOTE — Telephone Encounter (Signed)
 sent Jennifer Lynn a message requesting more info per PA response above.

## 2024-12-27 ENCOUNTER — Other Ambulatory Visit (HOSPITAL_COMMUNITY): Payer: Self-pay

## 2024-12-28 ENCOUNTER — Other Ambulatory Visit (HOSPITAL_COMMUNITY): Payer: Self-pay

## 2024-12-28 ENCOUNTER — Telehealth: Payer: Self-pay

## 2024-12-28 NOTE — Telephone Encounter (Signed)
 Pharmacy Patient Advocate Encounter  Received notification from [employer] that Prior Authorization for Wegovy  0.5MG /0.5ML Pen has been APPROVED from 12/27/24 to 12/27/25. Ran test claim, Copay is $4.00. This test claim was processed through [facility]- copay amounts may vary at other pharmacies due to pharmacy/plan contracts, or as the patient moves through the different stages of their insurance plan.   PA #/Case ID/Reference #: [id number]

## 2024-12-31 NOTE — Telephone Encounter (Signed)
 Mychart message sent to patient.

## 2025-01-11 ENCOUNTER — Telehealth: Payer: Self-pay

## 2025-01-11 ENCOUNTER — Other Ambulatory Visit: Payer: Self-pay

## 2025-01-11 DIAGNOSIS — R635 Abnormal weight gain: Secondary | ICD-10-CM

## 2025-01-11 DIAGNOSIS — E669 Obesity, unspecified: Secondary | ICD-10-CM

## 2025-01-11 MED ORDER — WEGOVY 0.5 MG/0.5ML ~~LOC~~ SOAJ: 0.5000 mg | SUBCUTANEOUS | 1 refills | Status: AC

## 2025-01-11 NOTE — Telephone Encounter (Signed)
 I reached out to patient due to a letter we have received from [employer]. The letter stated on 12/12/2024, The [place]  food and drug administration issued a class II recall of Wegovy  injections dose of 0.5 mg and 1 mg. I left a voicemail for patient stating I have sent in a new refill and asked the pharmacy to fill as soon as possible.
